# Patient Record
Sex: Female | Born: 1935 | Race: Black or African American | Hispanic: No | Marital: Single | Smoking: Never smoker
Health system: Southern US, Community
[De-identification: ages and names within clinical notes are randomized; demographics above are authoritative.]

## PROBLEM LIST (undated history)

## (undated) DIAGNOSIS — E785 Hyperlipidemia, unspecified: Secondary | ICD-10-CM

## (undated) DIAGNOSIS — I10 Essential (primary) hypertension: Secondary | ICD-10-CM

## (undated) DIAGNOSIS — M109 Gout, unspecified: Secondary | ICD-10-CM

## (undated) DIAGNOSIS — I4891 Unspecified atrial fibrillation: Secondary | ICD-10-CM

## (undated) DIAGNOSIS — I509 Heart failure, unspecified: Secondary | ICD-10-CM

## (undated) HISTORY — DX: Hyperlipidemia, unspecified: E78.5

## (undated) HISTORY — DX: Unspecified atrial fibrillation: I48.91

## (undated) HISTORY — DX: Gout, unspecified: M10.9

## (undated) HISTORY — PX: ABDOMINAL HYSTERECTOMY: SHX81

## (undated) HISTORY — DX: Heart failure, unspecified: I50.9

## (undated) HISTORY — DX: Essential (primary) hypertension: I10

---

## 2006-03-30 ENCOUNTER — Ambulatory Visit: Payer: Self-pay | Admitting: Internal Medicine

## 2006-03-31 ENCOUNTER — Ambulatory Visit: Payer: Self-pay | Admitting: Internal Medicine

## 2006-03-31 LAB — CONVERTED CEMR LAB
BUN: 21 mg/dL (ref 6–23)
Basophils Absolute: 0 10*3/uL (ref 0.0–0.1)
Basophils Relative: 0.5 % (ref 0.0–1.0)
CO2: 32 meq/L (ref 19–32)
Calcium: 9.5 mg/dL (ref 8.4–10.5)
Chloride: 106 meq/L (ref 96–112)
Creatinine, Ser: 1.2 mg/dL (ref 0.4–1.2)
Hemoglobin: 12.9 g/dL (ref 12.0–15.0)
MCHC: 34.2 g/dL (ref 30.0–36.0)
Monocytes Relative: 16.1 % — ABNORMAL HIGH (ref 3.0–11.0)
Platelets: 228 10*3/uL (ref 150–400)
Potassium: 4 meq/L (ref 3.5–5.1)
RBC: 3.99 M/uL (ref 3.87–5.11)
RDW: 14.2 % (ref 11.5–14.6)
TSH: 2.33 microintl units/mL (ref 0.35–5.50)

## 2006-04-03 ENCOUNTER — Ambulatory Visit: Payer: Self-pay | Admitting: Internal Medicine

## 2006-04-04 ENCOUNTER — Ambulatory Visit: Payer: Self-pay | Admitting: Internal Medicine

## 2006-04-06 ENCOUNTER — Ambulatory Visit: Payer: Self-pay | Admitting: Cardiovascular Disease

## 2006-04-10 ENCOUNTER — Ambulatory Visit: Payer: Self-pay | Admitting: Internal Medicine

## 2006-04-10 ENCOUNTER — Ambulatory Visit: Payer: Self-pay | Admitting: Cardiovascular Disease

## 2006-04-17 ENCOUNTER — Ambulatory Visit: Payer: Self-pay | Admitting: Cardiology

## 2006-04-24 ENCOUNTER — Ambulatory Visit: Payer: Self-pay | Admitting: Internal Medicine

## 2006-05-01 ENCOUNTER — Ambulatory Visit: Payer: Self-pay | Admitting: Internal Medicine

## 2006-05-01 LAB — CONVERTED CEMR LAB
AST: 26 units/L (ref 0–37)
Cholesterol: 163 mg/dL (ref 0–200)
HDL: 67.6 mg/dL (ref >39.0)
LDL Cholesterol: 85 mg/dL (ref 0–99)
Total CHOL/HDL Ratio: 2.4

## 2006-05-03 ENCOUNTER — Ambulatory Visit: Payer: Self-pay | Admitting: Cardiovascular Disease

## 2006-05-03 LAB — CONVERTED CEMR LAB
AST: 24 units/L (ref 0–37)
Albumin: 3.6 g/dL (ref 3.5–5.2)
Alkaline Phosphatase: 56 units/L (ref 39–117)
CO2: 30 meq/L (ref 19–32)
Chloride: 107 meq/L (ref 96–112)
Creatinine, Ser: 1.3 mg/dL — ABNORMAL HIGH (ref 0.4–1.2)
Potassium: 3.7 meq/L (ref 3.5–5.1)
Sodium: 143 meq/L (ref 135–145)
Total Bilirubin: 0.9 mg/dL (ref 0.3–1.2)
Total Protein: 7 g/dL (ref 6.0–8.3)

## 2006-05-15 ENCOUNTER — Ambulatory Visit: Payer: Self-pay | Admitting: Cardiovascular Disease

## 2006-05-29 ENCOUNTER — Ambulatory Visit: Payer: Self-pay | Admitting: Cardiology

## 2006-06-27 ENCOUNTER — Ambulatory Visit: Payer: Self-pay | Admitting: Cardiology

## 2006-06-29 DIAGNOSIS — I10 Essential (primary) hypertension: Secondary | ICD-10-CM | POA: Insufficient documentation

## 2006-06-29 DIAGNOSIS — E785 Hyperlipidemia, unspecified: Secondary | ICD-10-CM

## 2006-06-29 DIAGNOSIS — I509 Heart failure, unspecified: Secondary | ICD-10-CM | POA: Insufficient documentation

## 2006-06-30 ENCOUNTER — Ambulatory Visit: Payer: Self-pay | Admitting: Internal Medicine

## 2006-07-05 ENCOUNTER — Ambulatory Visit: Payer: Self-pay | Admitting: Cardiology

## 2006-07-13 ENCOUNTER — Telehealth (INDEPENDENT_AMBULATORY_CARE_PROVIDER_SITE_OTHER): Payer: Self-pay | Admitting: *Deleted

## 2006-07-13 LAB — CONVERTED CEMR LAB: RPR Titer: 1:2 {titer}

## 2006-07-26 ENCOUNTER — Ambulatory Visit: Payer: Self-pay | Admitting: Cardiovascular Disease

## 2006-08-07 ENCOUNTER — Ambulatory Visit: Payer: Self-pay | Admitting: Cardiovascular Disease

## 2006-08-07 ENCOUNTER — Ambulatory Visit: Payer: Self-pay | Admitting: Internal Medicine

## 2006-08-07 DIAGNOSIS — J309 Allergic rhinitis, unspecified: Secondary | ICD-10-CM | POA: Insufficient documentation

## 2006-08-07 DIAGNOSIS — F0391 Unspecified dementia with behavioral disturbance: Secondary | ICD-10-CM

## 2006-08-07 DIAGNOSIS — F341 Dysthymic disorder: Secondary | ICD-10-CM

## 2006-08-07 DIAGNOSIS — F0392 Unspecified dementia, unspecified severity, with psychotic disturbance: Secondary | ICD-10-CM | POA: Insufficient documentation

## 2006-08-21 ENCOUNTER — Ambulatory Visit: Payer: Self-pay | Admitting: Cardiovascular Disease

## 2006-08-24 ENCOUNTER — Encounter: Payer: Self-pay | Admitting: Cardiovascular Disease

## 2006-08-24 ENCOUNTER — Ambulatory Visit: Payer: Self-pay

## 2006-09-04 ENCOUNTER — Ambulatory Visit: Payer: Self-pay | Admitting: Cardiology

## 2006-09-20 ENCOUNTER — Ambulatory Visit: Payer: Self-pay | Admitting: Cardiology

## 2006-10-18 ENCOUNTER — Ambulatory Visit: Payer: Self-pay | Admitting: Internal Medicine

## 2006-10-27 ENCOUNTER — Ambulatory Visit: Payer: Self-pay | Admitting: Internal Medicine

## 2006-10-30 ENCOUNTER — Encounter: Payer: Self-pay | Admitting: Internal Medicine

## 2006-10-31 ENCOUNTER — Telehealth (INDEPENDENT_AMBULATORY_CARE_PROVIDER_SITE_OTHER): Payer: Self-pay | Admitting: *Deleted

## 2006-10-31 ENCOUNTER — Ambulatory Visit: Payer: Self-pay | Admitting: Internal Medicine

## 2006-10-31 DIAGNOSIS — R42 Dizziness and giddiness: Secondary | ICD-10-CM

## 2006-10-31 LAB — CONVERTED CEMR LAB
ALT: 75 units/L — ABNORMAL HIGH (ref 0–35)
AST: 77 units/L — ABNORMAL HIGH (ref 0–37)
BUN: 38 mg/dL — ABNORMAL HIGH (ref 6–23)
CO2: 30 meq/L (ref 19–32)
Chloride: 107 meq/L (ref 96–112)
Creatinine, Ser: 2.4 mg/dL — ABNORMAL HIGH (ref 0.4–1.2)
Hemoglobin: 11.4 g/dL

## 2006-11-08 ENCOUNTER — Ambulatory Visit: Payer: Self-pay

## 2006-11-08 ENCOUNTER — Encounter: Payer: Self-pay | Admitting: Internal Medicine

## 2006-11-08 ENCOUNTER — Ambulatory Visit: Payer: Self-pay | Admitting: Internal Medicine

## 2006-11-13 ENCOUNTER — Telehealth (INDEPENDENT_AMBULATORY_CARE_PROVIDER_SITE_OTHER): Payer: Self-pay | Admitting: *Deleted

## 2006-11-23 ENCOUNTER — Ambulatory Visit: Payer: Self-pay | Admitting: Cardiology

## 2006-12-06 ENCOUNTER — Encounter: Admission: RE | Admit: 2006-12-06 | Discharge: 2006-12-06 | Payer: Self-pay | Admitting: Internal Medicine

## 2006-12-06 ENCOUNTER — Ambulatory Visit: Payer: Self-pay | Admitting: Cardiology

## 2006-12-15 ENCOUNTER — Telehealth: Payer: Self-pay | Admitting: Internal Medicine

## 2006-12-27 ENCOUNTER — Telehealth (INDEPENDENT_AMBULATORY_CARE_PROVIDER_SITE_OTHER): Payer: Self-pay | Admitting: *Deleted

## 2007-01-03 ENCOUNTER — Ambulatory Visit: Payer: Self-pay | Admitting: Cardiology

## 2007-01-10 ENCOUNTER — Encounter: Admission: RE | Admit: 2007-01-10 | Discharge: 2007-01-10 | Payer: Self-pay | Admitting: Internal Medicine

## 2007-01-10 ENCOUNTER — Encounter (INDEPENDENT_AMBULATORY_CARE_PROVIDER_SITE_OTHER): Payer: Self-pay | Admitting: Interventional Radiology

## 2007-01-10 ENCOUNTER — Ambulatory Visit: Payer: Self-pay | Admitting: Cardiology

## 2007-01-10 ENCOUNTER — Other Ambulatory Visit: Admission: RE | Admit: 2007-01-10 | Discharge: 2007-01-10 | Payer: Self-pay | Admitting: Interventional Radiology

## 2007-01-10 ENCOUNTER — Encounter: Payer: Self-pay | Admitting: Internal Medicine

## 2007-01-15 ENCOUNTER — Ambulatory Visit: Payer: Self-pay | Admitting: Internal Medicine

## 2007-01-15 ENCOUNTER — Ambulatory Visit: Payer: Self-pay | Admitting: Cardiovascular Disease

## 2007-01-31 ENCOUNTER — Ambulatory Visit: Payer: Self-pay | Admitting: Cardiology

## 2007-02-21 ENCOUNTER — Telehealth (INDEPENDENT_AMBULATORY_CARE_PROVIDER_SITE_OTHER): Payer: Self-pay | Admitting: *Deleted

## 2007-02-22 ENCOUNTER — Ambulatory Visit: Payer: Self-pay | Admitting: Cardiology

## 2007-03-05 ENCOUNTER — Ambulatory Visit: Payer: Self-pay | Admitting: Cardiovascular Disease

## 2007-03-16 ENCOUNTER — Ambulatory Visit: Payer: Self-pay | Admitting: Internal Medicine

## 2007-03-16 DIAGNOSIS — R269 Unspecified abnormalities of gait and mobility: Secondary | ICD-10-CM | POA: Insufficient documentation

## 2007-03-19 ENCOUNTER — Ambulatory Visit: Payer: Self-pay | Admitting: Internal Medicine

## 2007-03-27 ENCOUNTER — Ambulatory Visit: Payer: Self-pay | Admitting: Internal Medicine

## 2007-03-28 ENCOUNTER — Telehealth: Payer: Self-pay | Admitting: Internal Medicine

## 2007-03-28 ENCOUNTER — Emergency Department (HOSPITAL_COMMUNITY): Admission: EM | Admit: 2007-03-28 | Discharge: 2007-03-28 | Payer: Self-pay | Admitting: Emergency Medicine

## 2007-03-30 ENCOUNTER — Telehealth (INDEPENDENT_AMBULATORY_CARE_PROVIDER_SITE_OTHER): Payer: Self-pay | Admitting: *Deleted

## 2007-03-30 LAB — CONVERTED CEMR LAB
ALT: 74 units/L — ABNORMAL HIGH (ref 0–35)
Albumin: 3.5 g/dL (ref 3.5–5.2)
Alkaline Phosphatase: 49 units/L (ref 39–117)
BUN: 15 mg/dL (ref 6–23)
Bilirubin, Direct: 0.1 mg/dL (ref 0.0–0.3)
Calcium: 9.5 mg/dL (ref 8.4–10.5)
Chloride: 104 meq/L (ref 96–112)
GFR calc Af Amer: 46 mL/min
GFR calc non Af Amer: 38 mL/min

## 2007-04-02 ENCOUNTER — Ambulatory Visit: Payer: Self-pay | Admitting: Cardiovascular Disease

## 2007-04-12 ENCOUNTER — Encounter: Payer: Self-pay | Admitting: Internal Medicine

## 2007-04-19 ENCOUNTER — Ambulatory Visit: Payer: Self-pay | Admitting: Internal Medicine

## 2007-04-19 DIAGNOSIS — I4891 Unspecified atrial fibrillation: Secondary | ICD-10-CM

## 2007-04-24 ENCOUNTER — Encounter: Payer: Self-pay | Admitting: Internal Medicine

## 2007-04-27 ENCOUNTER — Ambulatory Visit: Payer: Self-pay | Admitting: Internal Medicine

## 2007-04-27 ENCOUNTER — Ambulatory Visit: Payer: Self-pay | Admitting: Cardiovascular Disease

## 2007-04-27 ENCOUNTER — Encounter: Payer: Self-pay | Admitting: Internal Medicine

## 2007-05-07 ENCOUNTER — Ambulatory Visit: Payer: Self-pay | Admitting: Cardiovascular Disease

## 2007-05-18 ENCOUNTER — Telehealth: Payer: Self-pay | Admitting: Internal Medicine

## 2007-05-22 ENCOUNTER — Ambulatory Visit: Payer: Self-pay | Admitting: Internal Medicine

## 2007-05-28 ENCOUNTER — Ambulatory Visit: Payer: Self-pay | Admitting: Internal Medicine

## 2007-06-11 ENCOUNTER — Ambulatory Visit: Payer: Self-pay | Admitting: Cardiology

## 2007-06-25 ENCOUNTER — Ambulatory Visit: Payer: Self-pay | Admitting: Cardiology

## 2007-07-10 ENCOUNTER — Telehealth (INDEPENDENT_AMBULATORY_CARE_PROVIDER_SITE_OTHER): Payer: Self-pay | Admitting: *Deleted

## 2007-07-10 ENCOUNTER — Ambulatory Visit: Payer: Self-pay | Admitting: Cardiology

## 2007-07-16 ENCOUNTER — Ambulatory Visit: Payer: Self-pay | Admitting: Internal Medicine

## 2007-07-16 ENCOUNTER — Telehealth (INDEPENDENT_AMBULATORY_CARE_PROVIDER_SITE_OTHER): Payer: Self-pay | Admitting: *Deleted

## 2007-07-17 ENCOUNTER — Encounter: Payer: Self-pay | Admitting: Internal Medicine

## 2007-07-18 LAB — CONVERTED CEMR LAB
Albumin: 3.4 g/dL — ABNORMAL LOW (ref 3.5–5.2)
Alkaline Phosphatase: 59 units/L (ref 39–117)
Basophils Relative: 0.5 % (ref 0.0–1.0)
Eosinophils Absolute: 0 10*3/uL (ref 0.0–0.7)
Eosinophils Relative: 1 % (ref 0.0–5.0)
HCT: 34.9 % — ABNORMAL LOW (ref 36.0–46.0)
Hemoglobin: 12.1 g/dL (ref 12.0–15.0)
MCV: 93.1 fL (ref 78.0–100.0)
Neutro Abs: 2.5 10*3/uL (ref 1.4–7.7)
Neutrophils Relative %: 59.3 % (ref 43.0–77.0)
RBC: 3.78 M/uL — ABNORMAL LOW (ref 3.87–5.11)
Total Protein: 6.9 g/dL (ref 6.0–8.3)
WBC: 4.2 10*3/uL — ABNORMAL LOW (ref 4.5–10.5)

## 2007-07-20 ENCOUNTER — Telehealth (INDEPENDENT_AMBULATORY_CARE_PROVIDER_SITE_OTHER): Payer: Self-pay | Admitting: *Deleted

## 2007-07-23 ENCOUNTER — Ambulatory Visit: Payer: Self-pay | Admitting: Cardiology

## 2007-07-30 ENCOUNTER — Ambulatory Visit: Payer: Self-pay | Admitting: Cardiology

## 2007-07-31 ENCOUNTER — Telehealth (INDEPENDENT_AMBULATORY_CARE_PROVIDER_SITE_OTHER): Payer: Self-pay | Admitting: *Deleted

## 2007-08-13 ENCOUNTER — Ambulatory Visit: Payer: Self-pay | Admitting: Cardiology

## 2007-08-24 ENCOUNTER — Telehealth (INDEPENDENT_AMBULATORY_CARE_PROVIDER_SITE_OTHER): Payer: Self-pay | Admitting: *Deleted

## 2007-08-29 ENCOUNTER — Ambulatory Visit: Payer: Self-pay | Admitting: Cardiology

## 2007-09-18 ENCOUNTER — Emergency Department (HOSPITAL_COMMUNITY): Admission: EM | Admit: 2007-09-18 | Discharge: 2007-09-18 | Payer: Self-pay | Admitting: Emergency Medicine

## 2007-09-19 ENCOUNTER — Ambulatory Visit: Payer: Self-pay | Admitting: Cardiology

## 2007-09-21 ENCOUNTER — Emergency Department (HOSPITAL_COMMUNITY): Admission: EM | Admit: 2007-09-21 | Discharge: 2007-09-21 | Payer: Self-pay | Admitting: Emergency Medicine

## 2007-10-05 ENCOUNTER — Ambulatory Visit: Payer: Self-pay | Admitting: Internal Medicine

## 2007-10-08 ENCOUNTER — Ambulatory Visit: Payer: Self-pay | Admitting: Internal Medicine

## 2007-10-09 ENCOUNTER — Emergency Department (HOSPITAL_COMMUNITY): Admission: EM | Admit: 2007-10-09 | Discharge: 2007-10-09 | Payer: Self-pay | Admitting: Emergency Medicine

## 2007-10-25 ENCOUNTER — Ambulatory Visit: Payer: Self-pay | Admitting: Cardiovascular Disease

## 2007-10-25 ENCOUNTER — Ambulatory Visit: Payer: Self-pay | Admitting: Cardiology

## 2007-11-08 ENCOUNTER — Ambulatory Visit: Payer: Self-pay | Admitting: Cardiology

## 2007-12-05 ENCOUNTER — Ambulatory Visit: Payer: Self-pay | Admitting: Cardiology

## 2008-01-02 ENCOUNTER — Ambulatory Visit: Payer: Self-pay | Admitting: Cardiology

## 2008-01-17 ENCOUNTER — Ambulatory Visit: Payer: Self-pay | Admitting: Cardiology

## 2008-02-18 ENCOUNTER — Ambulatory Visit: Payer: Self-pay | Admitting: Internal Medicine

## 2008-03-03 ENCOUNTER — Ambulatory Visit: Payer: Self-pay | Admitting: Cardiology

## 2008-04-01 ENCOUNTER — Ambulatory Visit: Payer: Self-pay | Admitting: Internal Medicine

## 2008-04-01 ENCOUNTER — Encounter (INDEPENDENT_AMBULATORY_CARE_PROVIDER_SITE_OTHER): Payer: Self-pay | Admitting: *Deleted

## 2008-04-08 ENCOUNTER — Encounter: Payer: Self-pay | Admitting: Internal Medicine

## 2008-04-17 ENCOUNTER — Ambulatory Visit: Payer: Self-pay | Admitting: Cardiovascular Disease

## 2008-04-30 ENCOUNTER — Ambulatory Visit: Payer: Self-pay | Admitting: Cardiology

## 2008-05-21 ENCOUNTER — Ambulatory Visit: Payer: Self-pay | Admitting: Cardiology

## 2008-06-04 ENCOUNTER — Ambulatory Visit: Payer: Self-pay | Admitting: Internal Medicine

## 2008-06-14 ENCOUNTER — Encounter: Payer: Self-pay | Admitting: Internal Medicine

## 2008-06-18 ENCOUNTER — Ambulatory Visit: Payer: Self-pay | Admitting: Cardiovascular Disease

## 2008-06-25 ENCOUNTER — Ambulatory Visit: Payer: Self-pay | Admitting: Internal Medicine

## 2008-06-25 LAB — CONVERTED CEMR LAB: Vit D, 25-Hydroxy: 35 ng/mL (ref 30–89)

## 2008-07-01 LAB — CONVERTED CEMR LAB
ALT: 12 units/L (ref 0–35)
AST: 24 units/L (ref 0–37)
Basophils Absolute: 0 10*3/uL (ref 0.0–0.1)
CO2: 24 meq/L (ref 19–32)
Calcium: 9.4 mg/dL (ref 8.4–10.5)
Creatinine, Ser: 1.6 mg/dL — ABNORMAL HIGH (ref 0.4–1.2)
Eosinophils Absolute: 0.1 10*3/uL (ref 0.0–0.7)
Glucose, Bld: 94 mg/dL (ref 70–99)
Lymphocytes Relative: 25.1 % (ref 12.0–46.0)
MCHC: 33.8 g/dL (ref 30.0–36.0)
Neutrophils Relative %: 57.7 % (ref 43.0–77.0)
RDW: 13.6 % (ref 11.5–14.6)
TSH: 4.36 microintl units/mL (ref 0.35–5.50)

## 2008-07-02 ENCOUNTER — Ambulatory Visit: Payer: Self-pay | Admitting: Cardiology

## 2008-07-15 ENCOUNTER — Encounter: Payer: Self-pay | Admitting: *Deleted

## 2008-07-16 ENCOUNTER — Ambulatory Visit: Payer: Self-pay | Admitting: Cardiology

## 2008-07-18 ENCOUNTER — Encounter (INDEPENDENT_AMBULATORY_CARE_PROVIDER_SITE_OTHER): Payer: Self-pay | Admitting: *Deleted

## 2008-08-06 ENCOUNTER — Ambulatory Visit: Payer: Self-pay | Admitting: Internal Medicine

## 2008-08-20 ENCOUNTER — Encounter: Payer: Self-pay | Admitting: *Deleted

## 2008-08-22 ENCOUNTER — Telehealth: Payer: Self-pay | Admitting: Cardiovascular Disease

## 2008-08-25 ENCOUNTER — Ambulatory Visit: Payer: Self-pay | Admitting: Internal Medicine

## 2008-08-25 LAB — CONVERTED CEMR LAB
Bacteria, UA: NONE SEEN
Bilirubin Urine: NEGATIVE
Ketones, urine, test strip: NEGATIVE
Protein, U semiquant: NEGATIVE
RBC / HPF: NONE SEEN (ref <3)
Urobilinogen, UA: 0.2
pH: 5

## 2008-08-26 ENCOUNTER — Encounter: Payer: Self-pay | Admitting: Internal Medicine

## 2008-09-04 ENCOUNTER — Ambulatory Visit: Payer: Self-pay | Admitting: Cardiovascular Disease

## 2008-09-04 LAB — CONVERTED CEMR LAB: Prothrombin Time: 21.8 s

## 2008-09-09 ENCOUNTER — Ambulatory Visit: Payer: Self-pay | Admitting: Cardiovascular Disease

## 2008-09-25 ENCOUNTER — Ambulatory Visit: Payer: Self-pay | Admitting: Internal Medicine

## 2008-09-25 LAB — CONVERTED CEMR LAB: POC INR: 4

## 2008-10-09 ENCOUNTER — Ambulatory Visit: Payer: Self-pay | Admitting: Cardiovascular Disease

## 2008-10-30 ENCOUNTER — Ambulatory Visit: Payer: Self-pay | Admitting: Cardiology

## 2008-11-20 ENCOUNTER — Ambulatory Visit: Payer: Self-pay | Admitting: Internal Medicine

## 2008-11-30 IMAGING — CR DG FOOT COMPLETE 3+V*L*
3 series · 3 of 3 positions shown · non-contrast
Comparison: None

CLINICAL DATA: Fell 1 month ago.  Pain and swelling.

LEFT FOOT - COMPLETE 3+ VIEW

[t foot ap left]
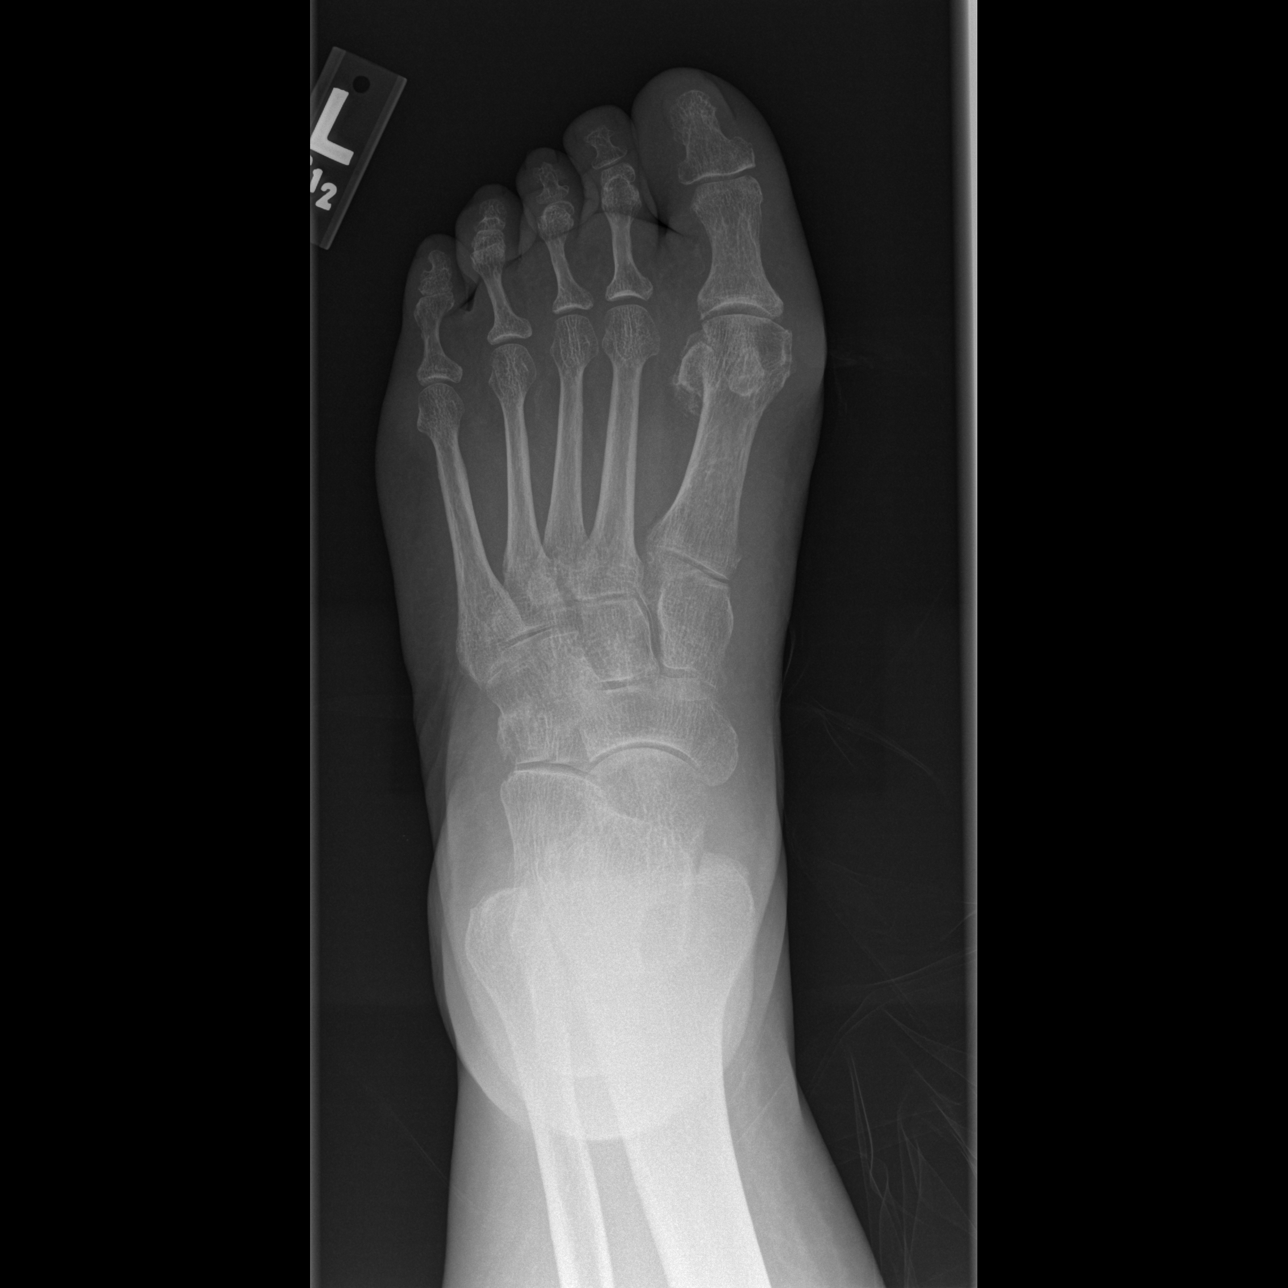

[t foot oblique left]
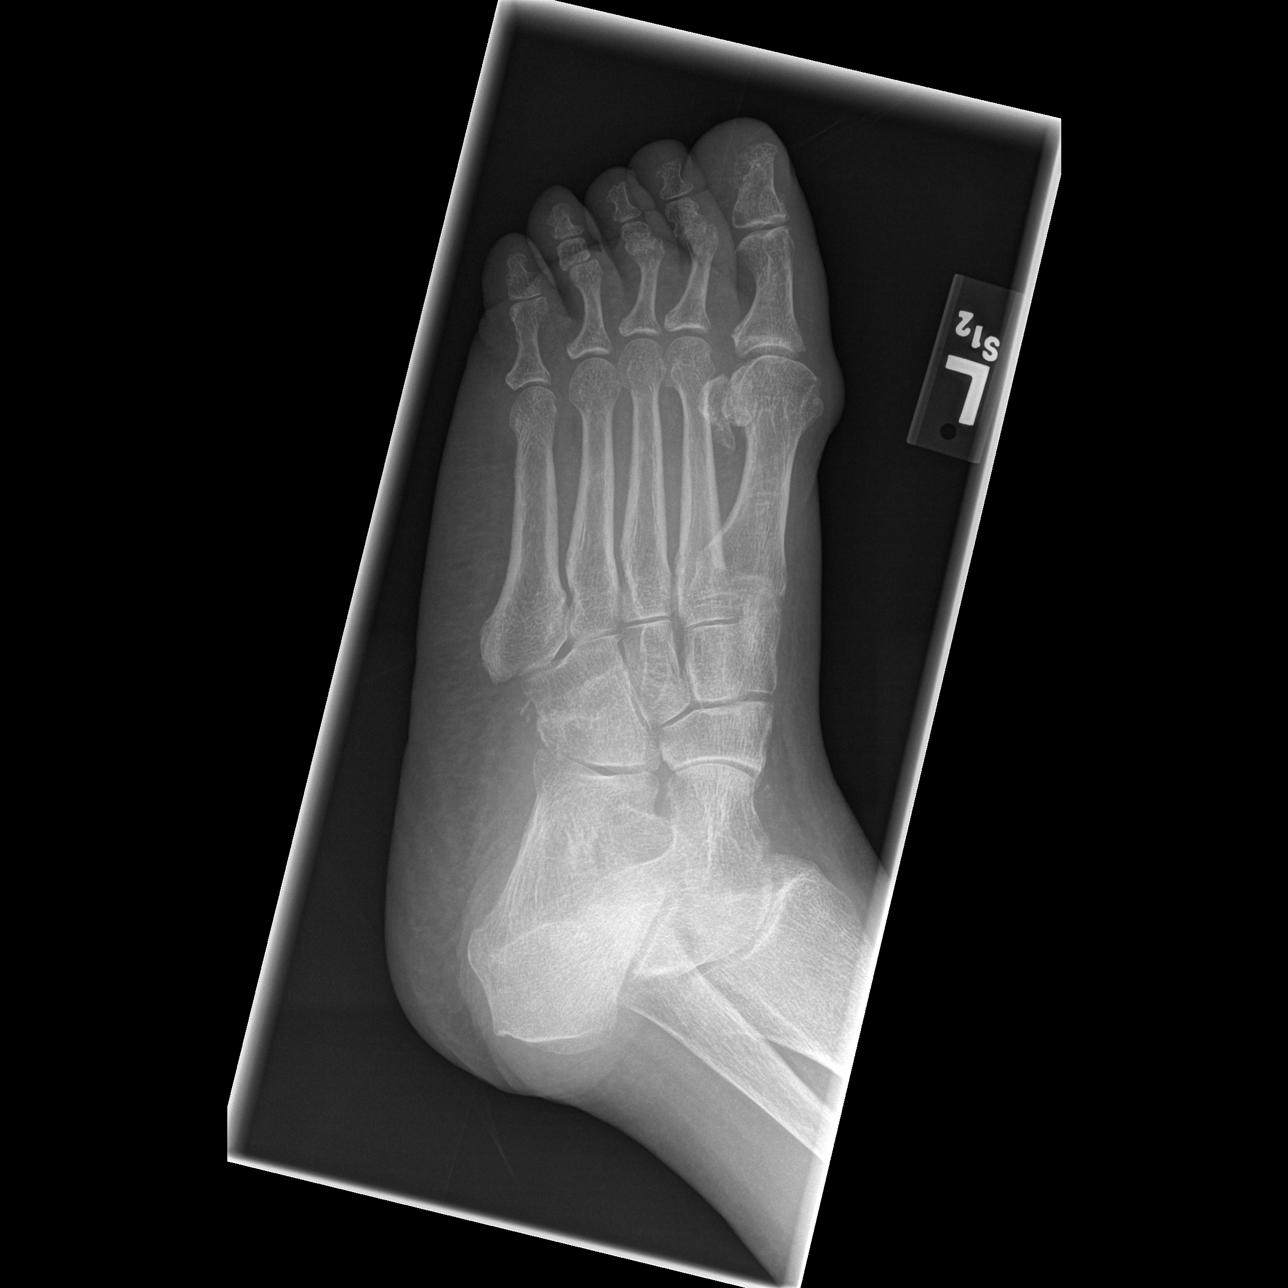

[t foot lat left]
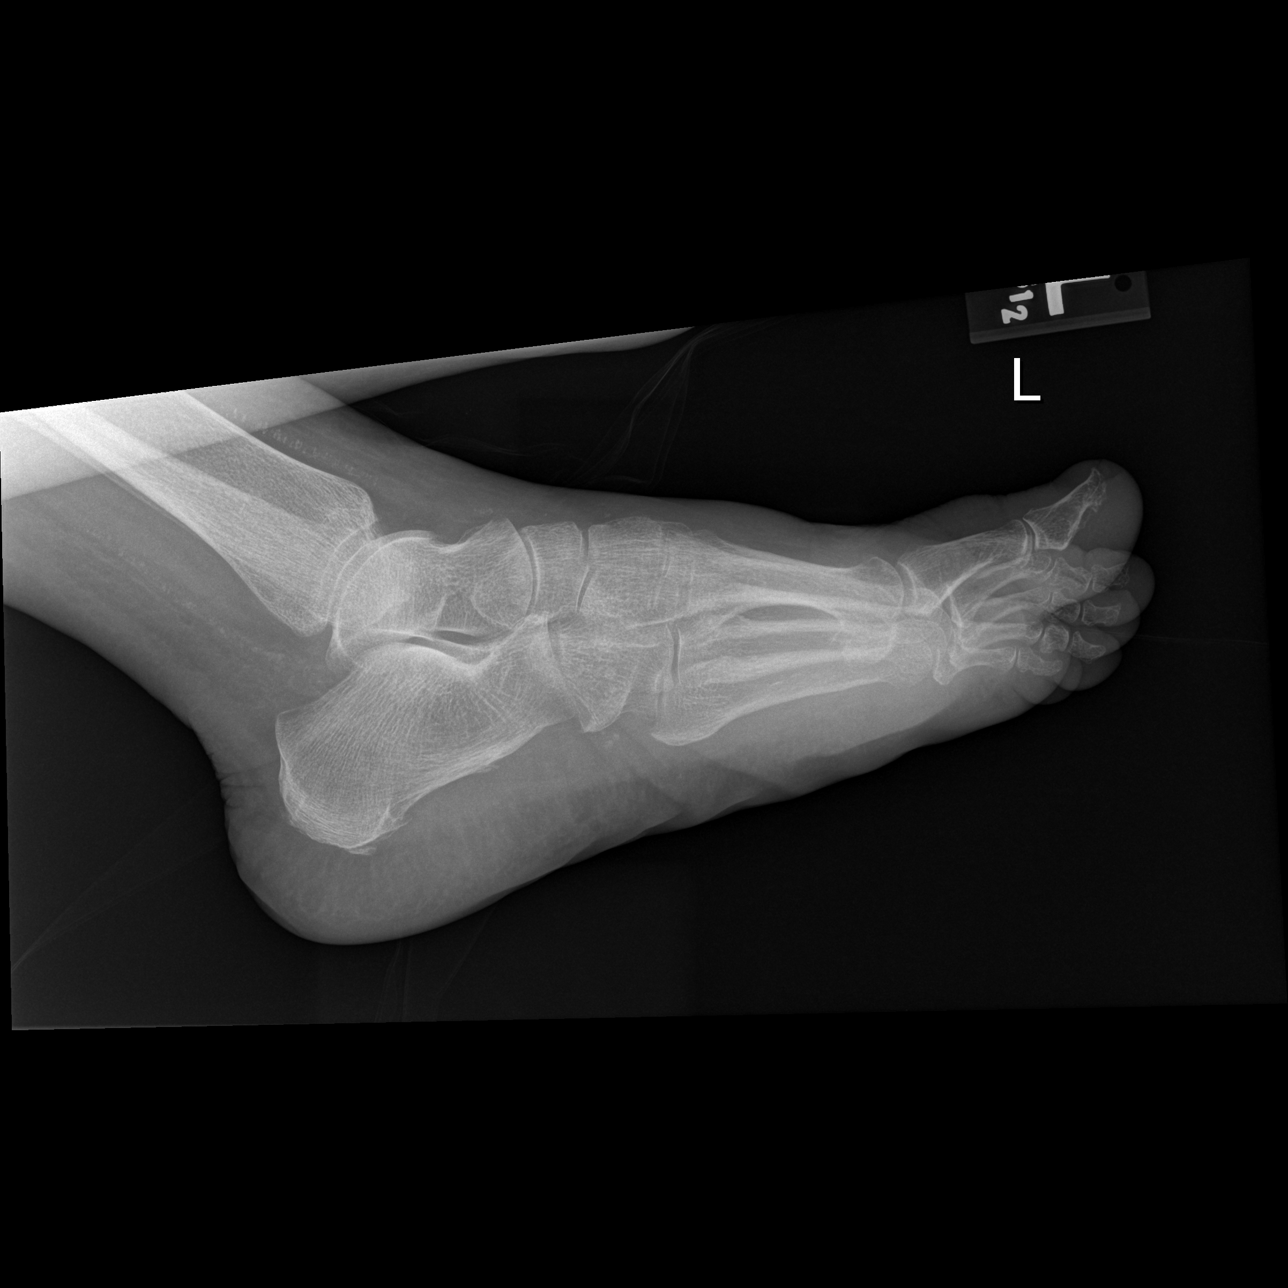

[3 of 3 positions shown; findings below may reference images not displayed]

FINDINGS: There is chronic degenerative disease at the metatarsal
phalangeal joint of the great toe.  I do not see a fracture.  There
is an abnormal appearance of the cuboid.  This could relate to a
subacute fracture or could be due to an old injury.  The other
bones appear unremarkable.
IMPRESSION: Degenerative change of the metatarsal phalangeal joint of the great
toe without evidence of acute injury in that location.

Abnormal appearance of the cuboid that could represent a subacute
fracture or a distant injury.

## 2008-12-18 ENCOUNTER — Ambulatory Visit: Payer: Self-pay | Admitting: Internal Medicine

## 2008-12-18 LAB — CONVERTED CEMR LAB: POC INR: 2.3

## 2008-12-26 ENCOUNTER — Encounter (INDEPENDENT_AMBULATORY_CARE_PROVIDER_SITE_OTHER): Payer: Self-pay | Admitting: *Deleted

## 2008-12-26 ENCOUNTER — Encounter: Payer: Self-pay | Admitting: Internal Medicine

## 2009-01-15 ENCOUNTER — Ambulatory Visit: Payer: Self-pay | Admitting: Cardiovascular Disease

## 2009-02-19 ENCOUNTER — Ambulatory Visit: Payer: Self-pay | Admitting: Cardiovascular Disease

## 2009-03-19 ENCOUNTER — Ambulatory Visit: Payer: Self-pay | Admitting: Cardiology

## 2009-03-19 LAB — CONVERTED CEMR LAB: POC INR: 2.2

## 2009-04-16 ENCOUNTER — Ambulatory Visit: Payer: Self-pay | Admitting: Cardiology

## 2009-04-16 LAB — CONVERTED CEMR LAB: POC INR: 1.8

## 2009-05-07 ENCOUNTER — Ambulatory Visit: Payer: Self-pay | Admitting: Cardiology

## 2009-05-08 ENCOUNTER — Telehealth (INDEPENDENT_AMBULATORY_CARE_PROVIDER_SITE_OTHER): Payer: Self-pay | Admitting: *Deleted

## 2009-05-21 ENCOUNTER — Ambulatory Visit: Payer: Self-pay | Admitting: Cardiovascular Disease

## 2009-05-29 ENCOUNTER — Encounter (INDEPENDENT_AMBULATORY_CARE_PROVIDER_SITE_OTHER): Payer: Self-pay | Admitting: *Deleted

## 2009-06-11 ENCOUNTER — Ambulatory Visit: Payer: Self-pay | Admitting: Cardiology

## 2009-06-11 LAB — CONVERTED CEMR LAB: POC INR: 1.8

## 2009-06-25 ENCOUNTER — Ambulatory Visit: Payer: Self-pay | Admitting: Internal Medicine

## 2009-07-02 ENCOUNTER — Encounter (INDEPENDENT_AMBULATORY_CARE_PROVIDER_SITE_OTHER): Payer: Self-pay | Admitting: *Deleted

## 2009-07-14 ENCOUNTER — Ambulatory Visit: Payer: Self-pay

## 2009-08-07 ENCOUNTER — Ambulatory Visit: Payer: Self-pay | Admitting: Cardiology

## 2009-09-04 ENCOUNTER — Ambulatory Visit: Payer: Self-pay | Admitting: Cardiology

## 2009-09-04 LAB — CONVERTED CEMR LAB: POC INR: 1.9

## 2009-09-15 ENCOUNTER — Ambulatory Visit: Payer: Self-pay | Admitting: Internal Medicine

## 2009-09-25 ENCOUNTER — Ambulatory Visit: Payer: Self-pay | Admitting: Internal Medicine

## 2009-10-23 ENCOUNTER — Ambulatory Visit: Payer: Self-pay | Admitting: Internal Medicine

## 2009-10-23 LAB — CONVERTED CEMR LAB: POC INR: 2.6

## 2009-11-20 ENCOUNTER — Ambulatory Visit: Payer: Self-pay | Admitting: Cardiology

## 2009-11-20 LAB — CONVERTED CEMR LAB: POC INR: 1.4

## 2009-12-04 ENCOUNTER — Ambulatory Visit: Payer: Self-pay | Admitting: Internal Medicine

## 2009-12-04 LAB — CONVERTED CEMR LAB: POC INR: 2.9

## 2010-01-01 ENCOUNTER — Ambulatory Visit: Payer: Self-pay | Admitting: Cardiology

## 2010-01-22 ENCOUNTER — Ambulatory Visit: Payer: Self-pay | Admitting: Internal Medicine

## 2010-02-12 ENCOUNTER — Ambulatory Visit: Payer: Self-pay | Admitting: Cardiology

## 2010-02-12 LAB — CONVERTED CEMR LAB: POC INR: 2.6

## 2010-02-23 ENCOUNTER — Telehealth: Payer: Self-pay | Admitting: Internal Medicine

## 2010-03-09 ENCOUNTER — Ambulatory Visit: Admit: 2010-03-09 | Payer: Self-pay | Admitting: Internal Medicine

## 2010-03-12 ENCOUNTER — Ambulatory Visit: Admission: RE | Admit: 2010-03-12 | Discharge: 2010-03-12 | Payer: Self-pay | Source: Home / Self Care

## 2010-03-16 NOTE — Assessment & Plan Note (Signed)
Summary: Sara Carlson /KN   Vital Signs:  Patient profile:   77 year old female Height:      61.25 inches Weight:      163.13 pounds BMI:     30.68 Pulse rate:   93 / minute BP sitting:   136 / 76  (left arm) Cuff size:   large  Vitals Entered By: Army Fossa CMA (September 15, 2009 2:30 PM) CC: ROV: not fasting Comments Not had recent colonocopy or mammo   History of Present Illness:  ROV Congestive heart failure-- good medication compliance ,  saw cards several months ago, stable   Hypertension--  checks ambulatory BPs sometimes , usually "good" dementia-- slightly  worse per daughter ; can't remember where things are ; no "wondering" outside her place  WEIGHT LOSS  -- minimal wt loss if any, appetite "ok"    Preventive Screening-Counseling & Management  Alcohol-Tobacco     Alcohol type: occasional wine  Caffeine-Diet-Exercise     Does Patient Exercise: yes  Current Medications (verified): 1)  Spironolactone 25 Mg Tabs (Spironolactone) .... Take 1 Tablet By Mouth Once A Day 2)  Diovan 160 Mg  Tabs (Valsartan) .Marland Kitchen.. 1 By Mouth Once Daily - Needs Visit For Additional Refills 3)  Furosemide 40 Mg Tabs (Furosemide) .... Take 1 By Mouth Two Times A Day 4)  Cardura 8 Mg  Tabs (Doxazosin Mesylate) .Marland Kitchen.. 1 By Mouth Once Daily 5)  Felodipine 2.5 Mg  Tb24 (Felodipine) .Marland Kitchen.. 1 By Mouth Once Daily 6)  Systane Ultra 0.4-0.3 % Soln (Polyethyl Glycol-Propyl Glycol) .... As Diretced 7)  Aricept 10 Mg Tabs (Donepezil Hcl) .Marland Kitchen.. 1 By Mouth Once Daily 8)  Coumadin 2.5 Mg Tabs (Warfarin Sodium) .... As Diretced 9)  Claritin-D 12 Hour 5-120 Mg Xr12h-Tab (Loratadine-Pseudoephedrine)  Allergies (verified): No Known Drug Allergies  Past History:  Past Medical History: Congestive heart failure Hyperlipidemia Hypertension anxiety-depression dementia WEIGHT LOSS   ATRIAL FIBRILLATION, PAROXYSMAL ALLERGIC RHINITIS (ICD-477.9)  Past Surgical History: Reviewed history from 04/01/2008 and no  changes required. Hysterectomy, no oophorectomy   Family History: colon ca--no breast ca--no DM--no CAD--no  Social History: Moved from [place] to [place] Feb. 2008 lives in her appartment "independent living" ADLs: independent doesn't drive Daughter [name] is a friend of Dr. Eden Emms and works at [employer] day care. no tobacco ETOH--  wine sometimes Does Patient Exercise:  yes  Review of Systems CV:  Denies chest pain or discomfort and swelling of feet. Resp:  Denies cough and shortness of breath. GI:  Denies bloody stools, diarrhea, nausea, and vomiting.  Physical Exam  General:  alert and well-developed.   Lungs:  normal respiratory effort, no intercostal retractions, no accessory muscle use, and normal breath sounds.   Heart:  irregular, ventricular rate about 90/minute Abdomen:  soft, non-tender, and no distention.   Extremities:  no edema Neurologic:  alert, pleasant, cooperative Oriented to self, recognize her daughter. Oriented to place Not  oriented in time, does not know her address or phone number Psych:  not anxious appearing and not depressed appearing.  actually seems in good spirits   Impression & Recommendations:  Problem # 1:  ATRIAL FIBRILLATION, PAROXYSMAL (ICD-427.31) well controlled, closely follow up by cardiology Her updated medication list for this problem includes:    Felodipine 2.5 Mg Tb24 (Felodipine) .Marland Kitchen... 1 by mouth once daily    Coumadin 2.5 Mg Tabs (Warfarin sodium) .Marland Kitchen... As diretced  Orders: TLB-TSH (Thyroid Stimulating Hormone) (84443-TSH)  Problem # 2:  DEMENTIA, SENILE,  UNCOMPLICATED (ICD-290.0) currently on  Aricept Slightly worse per daughter, discussed Candiss Norse as an option patient and daughter willing to try printed material provided regards s/e , encouraged to read printout  no interactions found w/  coumadin  explained that this is in addition to aricept and will slow down memory loss   re asses in 2 months   Problem # 3:   HYPERTENSION (ICD-401.9) at goal , labs Her updated medication list for this problem includes:    Spironolactone 25 Mg Tabs (Spironolactone) .Marland Kitchen... Take 1 tablet by mouth once a day    Diovan 160 Mg Tabs (Valsartan) .Marland Kitchen... 1 by mouth once daily - needs visit for additional refills    Furosemide 40 Mg Tabs (Furosemide) .Marland Kitchen... Take 1 by mouth two times a day    Cardura 8 Mg Tabs (Doxazosin mesylate) .Marland Kitchen... 1 by mouth once daily    Felodipine 2.5 Mg Tb24 (Felodipine) .Marland Kitchen... 1 by mouth once daily  BP today: 136/76 Prior BP: 160/70 (09/09/2008)  Labs Reviewed: K+: 4.4 (06/25/2008) Creat: : 1.6 (06/25/2008)   Chol: 163 (05/01/2006)   HDL: 67.6 (05/01/2006)   LDL: 85 (05/01/2006)   TG: 54 (05/01/2006)  Orders: Venipuncture (46962) TLB-BMP (Basic Metabolic Panel-BMET) (80048-METABOL) TLB-CBC Platelet - w/Differential (85025-CBCD)  Problem # 4:  HYPERLIPIDEMIA (ICD-272.4) we have not  been able to check her FLP in a while we'll check labs today despite the fact that she had some cereal morning Labs Reviewed: SGOT: 24 (06/25/2008)   SGPT: 12 (06/25/2008)   HDL:67.6 (05/01/2006)  LDL:85 (05/01/2006)  Chol:163 (05/01/2006)  Trig:54 (05/01/2006)  Orders: TLB-Lipid Panel (80061-LIPID) TLB-ALT (SGPT) (84460-ALT) TLB-AST (SGOT) (84450-SGOT)  Problem # 5:  CONGESTIVE HEART FAILURE (ICD-428.0) seems well controlled Her updated medication list for this problem includes:    Spironolactone 25 Mg Tabs (Spironolactone) .Marland Kitchen... Take 1 tablet by mouth once a day    Diovan 160 Mg Tabs (Valsartan) .Marland Kitchen... 1 by mouth once daily - needs visit for additional refills    Furosemide 40 Mg Tabs (Furosemide) .Marland Kitchen... Take 1 by mouth two times a day    Coumadin 2.5 Mg Tabs (Warfarin sodium) .Marland Kitchen... As diretced  Problem # 6:  HEALTH SCREENING (ICD-V70.0) Will do a Medicare physical and return to the office  Problem # 7:  ALLERGIC RHINITIS (ICD-477.9)  taking Claritin-D Recommend to take plain Claritin  Her updated  medication list for this problem includes:    Claritin Reditabs 5 Mg Tbdp (Loratadine) ..... One a day as needed for allergies  Complete Medication List: 1)  Spironolactone 25 Mg Tabs (Spironolactone) .... Take 1 tablet by mouth once a day 2)  Diovan 160 Mg Tabs (Valsartan) .Marland Kitchen.. 1 by mouth once daily - needs visit for additional refills 3)  Furosemide 40 Mg Tabs (Furosemide) .... Take 1 by mouth two times a day 4)  Cardura 8 Mg Tabs (Doxazosin mesylate) .Marland Kitchen.. 1 by mouth once daily 5)  Felodipine 2.5 Mg Tb24 (Felodipine) .Marland Kitchen.. 1 by mouth once daily 6)  Systane Ultra 0.4-0.3 % Soln (Polyethyl glycol-propyl glycol) .... As diretced 7)  Aricept 10 Mg Tabs (Donepezil hcl) .Marland Kitchen.. 1 by mouth once daily 8)  Coumadin 2.5 Mg Tabs (Warfarin sodium) .... As diretced 9)  Claritin Reditabs 5 Mg Tbdp (Loratadine) .... One a day as needed for allergies 10)  Namenda 5 Mg Tabs (Memantine hcl) .Marland Kitchen.. 1 by mouth once daily x 10 days, then 1 by mouth two times a day  Patient Instructions: 1)  come back in 2  months fasting for your physical exam 2)  stop Claritin-D, take plain Claritin 5 mg daily if needed Prescriptions: NAMENDA 5 MG TABS (MEMANTINE HCL) 1 by mouth once daily x 10 days, then 1 by mouth two times a day  #60 x 6   Entered and Authorized by:   Elita Quick E. Paz MD   Signed by:   Nolon Rod. Paz MD on 09/15/2009   Method used:   Print then Give to Patient   RxID:   [id number]    Risk Factors:  Alcohol use:  yes    Type:  occasional wine Exercise:  yes

## 2010-03-16 NOTE — Medication Information (Signed)
Summary: rov/tm  Anticoagulant Therapy  Managed by: Eda Keys, PharmD Referring MD: Charlton Haws MD Supervising MD: [name] MD, Sharlot Gowda Indication 1: Atrial Fibrillation (ICD-427.31) Lab Used: [facility] Site: [facility] INR POC 1.9 INR RANGE 2.0-3.0  Dietary changes: no    Health status changes: no    Bleeding/hemorrhagic complications: no    Recent/future hospitalizations: no    Any changes in medication regimen? no    Recent/future dental: no  Any missed doses?: no       Is patient compliant with meds? yes       Allergies: No Known Drug Allergies  Anticoagulation Management History:      The patient is taking warfarin and comes in today for a routine follow up visit.  Positive risk factors for bleeding include an age of 75 years or older and presence of serious comorbidities.  The bleeding index is 'intermediate risk'.  Positive CHADS2 values include History of CHF, History of HTN, and Age > 75 years old.  The start date was 04/10/2006.  Her last INR was 4.5 RATIO.  Anticoagulation responsible provider: [name] MD, Sharlot Gowda.  INR POC: 1.9.  Cuvette Lot#: [id number].  Exp: 09/2010.    Anticoagulation Management Assessment/Plan:      The patient's current anticoagulation dose is Coumadin 2.5 mg tabs: as diretced.  The target INR is 2 - 3.  The next INR is due 08/05/2009.  Anticoagulation instructions were given to patient/daughter.  Results were reviewed/authorized by Eda Keys, PharmD.  She was notified by Eda Keys.         Prior Anticoagulation Instructions: INR 4.3 Skip 1 dose of coumadin.  Then change dose to 2 pills everyday except 1 pill on Tuesdays, Thursdays and Saturdays. Recheck in 2-3 weeks.   Current Anticoagulation Instructions: INR 1.9  Take 2 tablets today.  Then return to normal dosing schedule of 1 tablet on Tuesday, Thursday, and Saturday, and 2 tablets all other days.  Return to clinic in 3 weeks.

## 2010-03-16 NOTE — Medication Information (Signed)
Summary: rov/sel  Anticoagulant Therapy  Managed by: Cloyde Reams, RN, BSN Referring MD: Charlton Haws MD Supervising MD: Juanda Chance MD, Bruce Indication 1: Atrial Fibrillation (ICD-427.31) Lab Used: [facility] Site: [facility] INR POC 2.9 INR RANGE 2.0-3.0  Dietary changes: no    Health status changes: no    Bleeding/hemorrhagic complications: no    Recent/future hospitalizations: no    Any changes in medication regimen? no    Recent/future dental: no  Any missed doses?: no       Is patient compliant with meds? yes       Allergies: No Known Drug Allergies  Anticoagulation Management History:      The patient is taking warfarin and comes in today for a routine follow up visit.  Positive risk factors for bleeding include an age of 75 years or older and presence of serious comorbidities.  The bleeding index is 'intermediate risk'.  Positive CHADS2 values include History of CHF, History of HTN, and Age > 3 years old.  The start date was 04/10/2006.  Her last INR was 4.5 RATIO.  Anticoagulation responsible provider: Juanda Chance MD, Smitty Cords.  INR POC: 2.9.  Cuvette Lot#: [id number].  Exp: 01/2011.    Anticoagulation Management Assessment/Plan:      The patient's current anticoagulation dose is Coumadin 2.5 mg tabs: as diretced.  The target INR is 2 - 3.  The next INR is due 01/01/2010.  Anticoagulation instructions were given to patient/daughter.  Results were reviewed/authorized by Cloyde Reams, RN, BSN.  She was notified by Cloyde Reams RN.         Prior Anticoagulation Instructions: INR 1.4  Take an extra tablet today and 2 tablets tomorrow. Then continue current dose of 2 tablets everyday except 1 tablet on Tuesday, Thursday, and Saturday. Recheck in 2 weeks.    Current Anticoagulation Instructions: INR 2.9  Continue on same dosage 2 tablets daily except 1 tablet on Tuesdays, Thursdays, and Saturdays.  Recheck in 4 weeks.

## 2010-03-16 NOTE — Medication Information (Signed)
Summary: rov/ewj  Anticoagulant Therapy  Managed by: Bethena Midget, RN, BSN Referring MD: Charlton Haws MD Supervising MD: Eden Emms MD, Theron Arista Indication 1: Atrial Fibrillation (ICD-427.31) Lab Used: [facility] Site: [facility] INR POC 2.0 INR RANGE 2.0-3.0  Dietary changes: no    Health status changes: no    Bleeding/hemorrhagic complications: no    Recent/future hospitalizations: no    Any changes in medication regimen? no    Recent/future dental: no  Any missed doses?: no       Is patient compliant with meds? yes       Allergies: No Known Drug Allergies  Anticoagulation Management History:      The patient is taking warfarin and comes in today for a routine follow up visit.  Positive risk factors for bleeding include an age of 75 years or older and presence of serious comorbidities.  The bleeding index is 'intermediate risk'.  Positive CHADS2 values include History of CHF, History of HTN, and Age > 67 years old.  The start date was 04/10/2006.  Her last INR was 4.5 RATIO.  Anticoagulation responsible provider: Eden Emms MD, Theron Arista.  INR POC: 2.0.  Cuvette Lot#: [id number].  Exp: 06/2010.    Anticoagulation Management Assessment/Plan:      The patient's current anticoagulation dose is Coumadin 2.5 mg tabs: as diretced.  The target INR is 2 - 3.  The next INR is due 06/11/2009.  Anticoagulation instructions were given to patient/daughter.  Results were reviewed/authorized by Bethena Midget, RN, BSN.  She was notified by Bethena Midget, RN, BSN.         Prior Anticoagulation Instructions: INR 1.5  Take an extra 1 tablet today then start taking 2 tablets daily except 1 tablet on Sundays, Tuesdays, and Thursdays.  Recheck in 2 weeks.    Current Anticoagulation Instructions: INR 2.0 Continue 2 pills everyday except  1pill on Tuesdays, Thursdays and Sundays.

## 2010-03-16 NOTE — Medication Information (Signed)
Summary: rov/eac  Anticoagulant Therapy  Managed by: Bethena Midget, RN, BSN Referring MD: Charlton Haws MD Supervising MD: Johney Frame MD, Fayrene Fearing Indication 1: Atrial Fibrillation (ICD-427.31) Lab Used: [facility] Site: [facility] INR POC 4.3 INR RANGE 2.0-3.0  Dietary changes: no    Health status changes: no    Bleeding/hemorrhagic complications: no    Recent/future hospitalizations: no    Any changes in medication regimen? no    Recent/future dental: no  Any missed doses?: no       Is patient compliant with meds? yes       Allergies: No Known Drug Allergies  Anticoagulation Management History:      The patient is taking warfarin and comes in today for a routine follow up visit.  Positive risk factors for bleeding include an age of 75 years or older and presence of serious comorbidities.  The bleeding index is 'intermediate risk'.  Positive CHADS2 values include History of CHF, History of HTN, and Age > 75 years old.  The start date was 04/10/2006.  Her last INR was 4.5 RATIO.  Anticoagulation responsible provider: Mikaili Flippin MD, Fayrene Fearing.  INR POC: 4.3.  Cuvette Lot#: [id number].  Exp: 09/2010.    Anticoagulation Management Assessment/Plan:      The patient's current anticoagulation dose is Coumadin 2.5 mg tabs: as diretced.  The target INR is 2 - 3.  The next INR is due 07/14/2009.  Anticoagulation instructions were given to patient/daughter.  Results were reviewed/authorized by Bethena Midget, RN, BSN.  She was notified by Bethena Midget, RN, BSN.         Prior Anticoagulation Instructions: INR 1.8 Today take extra 2.5mg s then change dose to 5mg s everyday except 2.5mg s on Tuesdays and Thursdays. Recheck in 2 weeks.   Current Anticoagulation Instructions: INR 4.3 Skip 1 dose of coumadin.  Then change dose to 2 pills everyday except 1 pill on Tuesdays, Thursdays and Saturdays. Recheck in 2-3 weeks.

## 2010-03-16 NOTE — Letter (Signed)
Summary: Appointment - Reminder 2  [facility], Main Office  [address]   [place], [place] [address]   Phone: [phone]  Fax: [phone]     Jul 02, 2009 MRN: [id number]   Sara Carlson [address] [place], [place]  [address]   Dear Ms. Haley,  Our records indicate that it is time to schedule a follow-up appointment with Dr. Eden Emms. It is very important that we reach you to schedule this appointment. We look forward to participating in your health care needs. Please contact us at the number listed above at your earliest convenience to schedule your appointment.  If you are unable to make an appointment at this time, give [place] a call so we can update our records.     Sincerely,   Migdalia Dk [facility] Scheduling Team

## 2010-03-16 NOTE — Medication Information (Signed)
Summary: rov/tm  Anticoagulant Therapy  Managed by: Cloyde Reams, RN, BSN Referring MD: Charlton Haws MD Supervising MD: Daleen Squibb MD, Maisie Fus Indication 1: Atrial Fibrillation (ICD-427.31) Lab Used: [facility] Site: [facility] INR POC 1.5 INR RANGE 2.0-3.0  Dietary changes: no    Health status changes: no    Bleeding/hemorrhagic complications: no    Recent/future hospitalizations: no    Any changes in medication regimen? no    Recent/future dental: no  Any missed doses?: no       Is patient compliant with meds? yes       Allergies (verified): No Known Drug Allergies  Anticoagulation Management History:      The patient is taking warfarin and comes in today for a routine follow up visit.  Positive risk factors for bleeding include an age of 75 years or older and presence of serious comorbidities.  The bleeding index is 'intermediate risk'.  Positive CHADS2 values include History of CHF, History of HTN, and Age > 75 years old.  The start date was 04/10/2006.  Her last INR was 4.5 RATIO.  Anticoagulation responsible provider: Daleen Squibb MD, Maisie Fus.  INR POC: 1.5.  Cuvette Lot#: [id number].  Exp: 06/2010.    Anticoagulation Management Assessment/Plan:      The patient's current anticoagulation dose is Coumadin 2.5 mg tabs: as diretced.  The target INR is 2 - 3.  The next INR is due 05/21/2009.  Anticoagulation instructions were given to patient/daughter.  Results were reviewed/authorized by Cloyde Reams, RN, BSN.  She was notified by Cloyde Reams RN.         Prior Anticoagulation Instructions: INR 1.8 Today take extra 1 tablet, then resume 1 tablet everyday except 2 tablets on Mondays, Wednesdays and Fridays. Recheck in 3 weeks.   Current Anticoagulation Instructions: INR 1.5  Take an extra 1 tablet today then start taking 2 tablets daily except 1 tablet on Sundays, Tuesdays, and Thursdays.  Recheck in 2 weeks.

## 2010-03-16 NOTE — Assessment & Plan Note (Signed)
Summary: PER CHECK OUT/SF   History of Present Illness: Sara Carlson is seen today for f/U of afib, anticoagulation, MR and pulmonary hypertension.  Despite her age she has been a reasonable coumadin candidate still living indep. and with no falls.  Her INR's have bee Rx.  She has had mild to moderate MR in the past but elevated PA pressures by echo disproportionately high at mid 80s.  She has had lower extremity edema likely from pulmonary hypertension and has responded well to Lasix and aldactone.  She is at assisted living [name] and [name] takes good care of her.  She loves her Snickers candy but has to limit it because of gout.  Despite the estimated PA pressure she has not acted like bad PHT with no significant SOB and given her age we have not been agressive about adding vasodilators  Preventive Screening-Counseling & Management  Alcohol-Tobacco     Smoking Status: never  Current Problems (verified): 1)  Uti  (ICD-599.0) 2)  Frequency, Urinary  (ICD-788.41) 3)  Health Screening  (ICD-V70.0) 4)  Atrial Fibrillation, Paroxysmal  (ICD-427.31) 5)  Abnormality of Gait  (ICD-781.2) 6)  Dizziness  (ICD-780.4) 7)  Dementia, Senile, Uncomplicated  (ICD-290.0) 8)  Allergic Rhinitis  (ICD-477.9) 9)  Anxiety Depression  (ICD-300.4) 10)  Hypertension  (ICD-401.9) 11)  Hyperlipidemia  (ICD-272.4) 12)  Congestive Heart Failure  (ICD-428.0)  Current Medications (verified): 1)  Spironolactone 25 Mg Tabs (Spironolactone) .... Take 1 Tablet By Mouth Once A Day 2)  Diovan 160 Mg  Tabs (Valsartan) .Marland Kitchen.. 1 By Mouth Qd 3)  Furosemide 40 Mg Tabs (Furosemide) .... Take 1 By Mouth Two Times A Day 4)  Cardura 8 Mg  Tabs (Doxazosin Mesylate) .Marland Kitchen.. 1 By Mouth Once Daily 5)  Felodipine 2.5 Mg  Tb24 (Felodipine) .Marland Kitchen.. 1 By Mouth Once Daily 6)  Systane Ultra 0.4-0.3 % Soln (Polyethyl Glycol-Propyl Glycol) .... As Diretced 7)  Aricept 10 Mg Tabs (Donepezil Hcl) .Marland Kitchen.. 1 By Mouth Once Daily 8)   Coumadin 2.5 Mg Tabs (Warfarin Sodium) .... As Diretced  Allergies (verified): No Known Drug Allergies  Past History:  Past Medical History: Last updated: 04/01/2008 Congestive heart failure Hyperlipidemia Hypertension anxiety-depression dementia WEIGHT LOSS   ATRIAL FIBRILLATION, PAROXYSMAL ALLERGIC RHINITIS (ICD-477.9)  Past Surgical History: Last updated: 04/01/2008 Hysterectomy, no oophorectomy   Family History: Last updated[place] 05/22/2007 [place]  Social History: Last updated: 04/01/2008 Moved from [place] to [place] Feb. 2008 lives in her appartment "independent living" ADLs: independent doesn't drive Daughter [name] is a friend of Dr. Eden Emms and works at [employer] day care.  Review of Systems       Denies fever, malais, weight loss, blurry vision, decreased visual acuity, cough, sputum, SOB, hemoptysis, pleuritic pain, palpitaitons, heartburn, abdominal pain, melena, lower extremity edema, claudication, or rash. All other systems reviewed and negative  Vital Signs:  Patient profile:   77 year old female Weight:      168 pounds Resp:     12 per minute  Vitals Entered By: Kem Parkinson (February 19, 2009 2:33 PM)  Physical Exam  General:  Affect appropriate Healthy:  appears stated age HEENT: normal Neck supple with no adenopathy JVP elevated  no bruits no thyromegaly Lungs clear with no wheezing and good diaphragmatic motion Heart:  S1/S2 no murmur,rub, gallop or click PMI normal Abdomen: benighn, BS positve, no tenderness, no AAA no bruit.  No HSM or HJR Distal pulses intact with no bruits Trace  edema Neuro non-focal  Skin warm and dry    Impression & Recommendations:  Problem # 1:  ATRIAL FIBRILLATION, PAROXYSMAL (ICD-427.31) Good rate control with no syncope or palpitations Her updated medication list for this problem includes:    Coumadin 2.5 Mg Tabs (Warfarin sodium) .Marland Kitchen... As diretced  Problem # 2:  HYPERTENSION (ICD-401.9) Well  contorlled Her updated medication list for this problem includes:    Spironolactone 25 Mg Tabs (Spironolactone) .Marland Kitchen... Take 1 tablet by mouth once a day    Diovan 160 Mg Tabs (Valsartan) .Marland Kitchen... 1 by mouth qd    Furosemide 40 Mg Tabs (Furosemide) .Marland Kitchen... Take 1 by mouth two times a day    Cardura 8 Mg Tabs (Doxazosin mesylate) .Marland Kitchen... 1 by mouth once daily    Felodipine 2.5 Mg Tb24 (Felodipine) .Marland Kitchen... 1 by mouth once daily  Problem # 3:  HYPERLIPIDEMIA (ICD-272.4) Continue diet Rx as LDL under 100 CHOL: 163 (05/01/2006)   LDL: 85 (05/01/2006)   HDL: 67.6 (05/01/2006)   TG: 54 (05/01/2006)  Problem # 4:  CONGESTIVE HEART FAILURE (ICD-428.0) ? Right sided issues with pulmonary hypertension by echo. Normal LV  continue current dose of Lasix and aldactone Her updated medication list for this problem includes:    Spironolactone 25 Mg Tabs (Spironolactone) .Marland Kitchen... Take 1 tablet by mouth once a day    Diovan 160 Mg Tabs (Valsartan) .Marland Kitchen... 1 by mouth qd    Furosemide 40 Mg Tabs (Furosemide) .Marland Kitchen... Take 1 by mouth two times a day    Felodipine 2.5 Mg Tb24 (Felodipine) .Marland Kitchen... 1 by mouth once daily    Coumadin 2.5 Mg Tabs (Warfarin sodium) .Marland Kitchen... As diretced  Problem # 5:  COUMADIN THERAPY (ICD-V58.61) INR Rx with no bleeding.  F/U coumadin clinic.

## 2010-03-16 NOTE — Medication Information (Signed)
Summary: rov/tm  Anticoagulant Therapy  Managed by: Bethena Midget, RN, BSN Referring MD: Charlton Haws MD Supervising MD: Johney Frame MD, Fayrene Fearing Indication 1: Atrial Fibrillation (ICD-427.31) Lab Used: [facility] Site: [facility] INR POC 3.2 INR RANGE 2.0-3.0  Dietary changes: no    Health status changes: no    Bleeding/hemorrhagic complications: no    Recent/future hospitalizations: no    Any changes in medication regimen? no    Recent/future dental: no  Any missed doses?: no       Is patient compliant with meds? yes       Allergies: No Known Drug Allergies  Anticoagulation Management History:      The patient is taking warfarin and comes in today for a routine follow up visit.  Positive risk factors for bleeding include an age of 75 years or older and presence of serious comorbidities.  The bleeding index is 'intermediate risk'.  Positive CHADS2 values include History of CHF, History of HTN, and Age > 75 years old.  The start date was 04/10/2006.  Her last INR was 4.5 RATIO.  Anticoagulation responsible provider: Allred MD, Fayrene Fearing.  INR POC: 3.2.  Cuvette Lot#: [id number].  Exp: 11/2010.    Anticoagulation Management Assessment/Plan:      The patient's current anticoagulation dose is Coumadin 2.5 mg tabs: as diretced.  The target INR is 2 - 3.  The next INR is due 02/12/2010.  Anticoagulation instructions were given to patient/daughter.  Results were reviewed/authorized by Bethena Midget, RN, BSN.  She was notified by Bethena Midget, RN, BSN.         Prior Anticoagulation Instructions: INR 3.3 skip coumadin dose  tomorrow, then resume 2 pills everyday except 1 pill on Tuesdays, Thursdays and Saturdays. Recheck in 3 weeks.   Current Anticoagulation Instructions: INR 3.2 Skip Saturdays dose then change dose to 1 tablet everyday except 2 tablets on Mondays, Wednesdays and Fridays. Recheck in 3 weeks.

## 2010-03-16 NOTE — Medication Information (Signed)
Summary: rov/ewj  Anticoagulant Therapy  Managed by: Bethena Midget, RN, BSN Referring MD: Charlton Haws MD Supervising MD: Jens Som MD, Arlys John Indication 1: Atrial Fibrillation (ICD-427.31) Lab Used: [facility] Site: [facility] INR POC 1.8 INR RANGE 2.0-3.0  Dietary changes: no    Health status changes: no    Bleeding/hemorrhagic complications: no    Recent/future hospitalizations: no    Any changes in medication regimen? no    Recent/future dental: no  Any missed doses?: yes     Details: Daughter states she thinks pt. missed a dose  Is patient compliant with meds? yes       Allergies: No Known Drug Allergies  Anticoagulation Management History:      The patient is taking warfarin and comes in today for a routine follow up visit.  Positive risk factors for bleeding include an age of 75 years or older and presence of serious comorbidities.  The bleeding index is 'intermediate risk'.  Positive CHADS2 values include History of CHF, History of HTN, and Age > 85 years old.  The start date was 04/10/2006.  Her last INR was 4.5 RATIO.  Anticoagulation responsible provider: Jens Som MD, Arlys John.  INR POC: 1.8.  Cuvette Lot#: [id number].  Exp: 06/2010.    Anticoagulation Management Assessment/Plan:      The patient's current anticoagulation dose is Coumadin 2.5 mg tabs: as diretced.  The target INR is 2 - 3.  The next INR is due 05/07/2009.  Anticoagulation instructions were given to patient/daughter.  Results were reviewed/authorized by Bethena Midget, RN, BSN.  She was notified by Bethena Midget, RN, BSN.         Prior Anticoagulation Instructions: INR 2.2  Continue on same dosage 1 tablet daily except 2 tablets on Mondays, Wednesdays, and Fridays.  Recheck in 4 weeks.    Current Anticoagulation Instructions: INR 1.8 Today take extra 1 tablet, then resume 1 tablet everyday except 2 tablets on Mondays, Wednesdays and Fridays. Recheck in 3 weeks.

## 2010-03-16 NOTE — Medication Information (Signed)
Summary: rov/cb  Anticoagulant Therapy  Managed by: Elaina Pattee, PharmD Referring MD: Charlton Haws MD Supervising MD: Daleen Squibb MD, Maisie Fus Indication 1: Atrial Fibrillation (ICD-427.31) Lab Used: [facility] Site: [facility] INR POC 1.9 INR RANGE 2.0-3.0  Dietary changes: no    Health status changes: no    Bleeding/hemorrhagic complications: no    Recent/future hospitalizations: no    Any changes in medication regimen? no    Recent/future dental: no  Any missed doses?: yes     Details: pt daughter states that she has most likely missed a dose  Is patient compliant with meds? yes       Allergies: No Known Drug Allergies  Anticoagulation Management History:      The patient is taking warfarin and comes in today for a routine follow up visit.  Positive risk factors for bleeding include an age of 75 years or older and presence of serious comorbidities.  The bleeding index is 'intermediate risk'.  Positive CHADS2 values include History of CHF, History of HTN, and Age > 4 years old.  The start date was 04/10/2006.  Her last INR was 4.5 RATIO.  Anticoagulation responsible provider: Daleen Squibb MD, Maisie Fus.  INR POC: 1.9.  Cuvette Lot#: [id number].  Exp: 11/2010.    Anticoagulation Management Assessment/Plan:      The patient's current anticoagulation dose is Coumadin 2.5 mg tabs: as diretced.  The target INR is 2 - 3.  The next INR is due 09/25/2009.  Anticoagulation instructions were given to patient/daughter.  Results were reviewed/authorized by Elaina Pattee, PharmD.  She was notified by Dillard Cannon .         Prior Anticoagulation Instructions: INR 2.8. Take 2 tablets daily except 1 tablet on Tues, Thurs, Sat. Recheck in 4 weeks.  Current Anticoagulation Instructions: INR 1.9  Take an extra 1 tab today.  Then continue same dose of 2 tabs daily except for 1 tab on Tuesday, Thursday, and Saturday.  Re-check in  3 weeks.

## 2010-03-16 NOTE — Medication Information (Signed)
Summary: rov/eac  Anticoagulant Therapy  Managed by: Weston Brass, PharmD Referring MD: Charlton Haws MD Supervising MD: Juanda Chance MD, Bruce Indication 1: Atrial Fibrillation (ICD-427.31) Lab Used: [facility] Site: [facility] INR POC 1.4 INR RANGE 2.0-3.0  Dietary changes: no    Health status changes: no    Bleeding/hemorrhagic complications: no    Recent/future hospitalizations: no    Any changes in medication regimen? no    Recent/future dental: no  Any missed doses?: yes     Details: MIssed 2 doses.   Is patient compliant with meds? yes       Allergies: No Known Drug Allergies  Anticoagulation Management History:      The patient is taking warfarin and comes in today for a routine follow up visit.  Positive risk factors for bleeding include an age of 75 years or older and presence of serious comorbidities.  The bleeding index is 'intermediate risk'.  Positive CHADS2 values include History of CHF, History of HTN, and Age > 30 years old.  The start date was 04/10/2006.  Her last INR was 4.5 RATIO.  Anticoagulation responsible provider: Juanda Chance MD, Smitty Cords.  INR POC: 1.4.  Cuvette Lot#: [id number].  Exp: 12/2010.    Anticoagulation Management Assessment/Plan:      The patient's current anticoagulation dose is Coumadin 2.5 mg tabs: as diretced.  The target INR is 2 - 3.  The next INR is due 12/04/2009.  Anticoagulation instructions were given to patient/daughter.  Results were reviewed/authorized by Weston Brass, PharmD.  She was notified by Ilean Skill D candidate.         Prior Anticoagulation Instructions: INR 2.6  Continue taking 1 tablet on Tuesday, Thursday, and Saturday and 2 tablets all other days.  Return to clinic in 4 weeks.    Current Anticoagulation Instructions: INR 1.4  Take an extra tablet today and 2 tablets tomorrow. Then continue current dose of 2 tablets everyday except 1 tablet on Tuesday, Thursday, and Saturday. Recheck in 2 weeks.

## 2010-03-16 NOTE — Medication Information (Signed)
Summary: rov/tm  Anticoagulant Therapy  Managed by: Bethena Midget, RN, BSN Referring MD: Charlton Haws MD Supervising MD: Daleen Squibb MD, Maisie Fus Indication 1: Atrial Fibrillation (ICD-427.31) Lab Used: [facility] Site: [facility] INR POC 1.8 INR RANGE 2.0-3.0  Dietary changes: no    Health status changes: no    Bleeding/hemorrhagic complications: no    Recent/future hospitalizations: no    Any changes in medication regimen? no    Recent/future dental: no  Any missed doses?: no       Is patient compliant with meds? yes       Allergies: No Known Drug Allergies  Anticoagulation Management History:      The patient is taking warfarin and comes in today for a routine follow up visit.  Positive risk factors for bleeding include an age of 75 years or older and presence of serious comorbidities.  The bleeding index is 'intermediate risk'.  Positive CHADS2 values include History of CHF, History of HTN, and Age > 53 years old.  The start date was 04/10/2006.  Her last INR was 4.5 RATIO.  Anticoagulation responsible provider: Daleen Squibb MD, Maisie Fus.  INR POC: 1.8.  Cuvette Lot#: [id number].  Exp: 07/2010.    Anticoagulation Management Assessment/Plan:      The patient's current anticoagulation dose is Coumadin 2.5 mg tabs: as diretced.  The target INR is 2 - 3.  The next INR is due 06/25/2009.  Anticoagulation instructions were given to patient/daughter.  Results were reviewed/authorized by Bethena Midget, RN, BSN.  She was notified by Bethena Midget, RN, BSN.         Prior Anticoagulation Instructions: INR 2.0 Continue 2 pills everyday except  1pill on Tuesdays, Thursdays and Sundays.   Current Anticoagulation Instructions: INR 1.8 Today take extra 2.5mg s then change dose to 5mg s everyday except 2.5mg s on Tuesdays and Thursdays. Recheck in 2 weeks.

## 2010-03-16 NOTE — Medication Information (Signed)
Summary: rov/ewj  Anticoagulant Therapy  Managed by: Teofilo Pod, RN Referring MD: Charlton Haws MD Supervising MD: Juanda Chance MD, Mirel Hundal Indication 1: Atrial Fibrillation (ICD-427.31) Lab Used: [facility] Site: [facility] INR POC 2.2 INR RANGE 2.0-3.0  Dietary changes: no    Health status changes: no    Bleeding/hemorrhagic complications: no    Recent/future hospitalizations: no    Any changes in medication regimen? no    Recent/future dental: no  Any missed doses?: no       Is patient compliant with meds? yes       Allergies (verified): No Known Drug Allergies  Anticoagulation Management History:      The patient is taking warfarin and comes in today for a routine follow up visit.  Positive risk factors for bleeding include an age of 92 years or older and presence of serious comorbidities.  The bleeding index is 'intermediate risk'.  Positive CHADS2 values include History of CHF, History of HTN, and Age > 6 years old.  The start date was 04/10/2006.  Her last INR was 4.5 RATIO.  Anticoagulation responsible provider: Juanda Chance MD, Smitty Cords.  INR POC: 2.2.  Cuvette Lot#: [id number].  Exp: 05/2010.    Anticoagulation Management Assessment/Plan:      The patient's current anticoagulation dose is Coumadin 2.5 mg tabs: as diretced.  The target INR is 2 - 3.  The next INR is due 04/16/2009.  Anticoagulation instructions were given to patient/daughter.  Results were reviewed/authorized by Teofilo Pod, RN.  She was notified by Cloyde Reams RN.         Prior Anticoagulation Instructions: INR 2.4 at goal (2-3) Continue the same dosage 2.5mg  tablet daily except for 5mg  on Mondays, Wednesdays and Fridays Recheck 4 weeks  Current Anticoagulation Instructions: INR 2.2  Continue on same dosage 1 tablet daily except 2 tablets on Mondays, Wednesdays, and Fridays.  Recheck in 4 weeks.

## 2010-03-16 NOTE — Medication Information (Signed)
Summary: rov/ewj  Anticoagulant Therapy  Managed by: Bethena Midget, RN, BSN Referring MD: Charlton Haws MD Supervising MD: Antoine Poche MD, Fayrene Fearing Indication 1: Atrial Fibrillation (ICD-427.31) Lab Used: [facility] Site: [facility] INR POC 3.3 INR RANGE 2.0-3.0  Dietary changes: no    Health status changes: no    Bleeding/hemorrhagic complications: no    Recent/future hospitalizations: no    Any changes in medication regimen? no    Recent/future dental: no  Any missed doses?: no       Is patient compliant with meds? yes       Allergies: No Known Drug Allergies  Anticoagulation Management History:      The patient is taking warfarin and comes in today for a routine follow up visit.  Positive risk factors for bleeding include an age of 75 years or older and presence of serious comorbidities.  The bleeding index is 'intermediate risk'.  Positive CHADS2 values include History of CHF, History of HTN, and Age > 18 years old.  The start date was 04/10/2006.  Her last INR was 4.5 RATIO.  Anticoagulation responsible provider: Antoine Poche MD, Fayrene Fearing.  INR POC: 3.3.  Cuvette Lot#: [id number].  Exp: 01/2011.    Anticoagulation Management Assessment/Plan:      The patient's current anticoagulation dose is Coumadin 2.5 mg tabs: as diretced.  The target INR is 2 - 3.  The next INR is due 01/22/2010.  Anticoagulation instructions were given to patient/daughter.  Results were reviewed/authorized by Bethena Midget, RN, BSN.  She was notified by Bethena Midget, RN, BSN.         Prior Anticoagulation Instructions: INR 2.9  Continue on same dosage 2 tablets daily except 1 tablet on Tuesdays, Thursdays, and Saturdays.  Recheck in 4 weeks.    Current Anticoagulation Instructions: INR 3.3 skip coumadin dose  tomorrow, then resume 2 pills everyday except 1 pill on Tuesdays, Thursdays and Saturdays. Recheck in 3 weeks.

## 2010-03-16 NOTE — Medication Information (Signed)
Summary: rov/eac  Anticoagulant Therapy  Managed by: Elaina Pattee, PharmD Referring MD: Charlton Haws MD Supervising MD: Tenny Craw MD, Gunnar Fusi Indication 1: Atrial Fibrillation (ICD-427.31) Lab Used: [facility] Site: [facility] INR POC 2.8 INR RANGE 2.0-3.0  Dietary changes: no    Health status changes: no    Bleeding/hemorrhagic complications: no    Recent/future hospitalizations: no    Any changes in medication regimen? no    Recent/future dental: no  Any missed doses?: no       Is patient compliant with meds? yes       Allergies: No Known Drug Allergies  Anticoagulation Management History:      The patient is taking warfarin and comes in today for a routine follow up visit.  Positive risk factors for bleeding include an age of 3 years or older and presence of serious comorbidities.  The bleeding index is 'intermediate risk'.  Positive CHADS2 values include History of CHF, History of HTN, and Age > 45 years old.  The start date was 04/10/2006.  Her last INR was 4.5 RATIO.  Anticoagulation responsible provider: Tenny Craw MD, Gunnar Fusi.  INR POC: 2.8.  Cuvette Lot#: [id number].  Exp: 09/2010.    Anticoagulation Management Assessment/Plan:      The patient's current anticoagulation dose is Coumadin 2.5 mg tabs: as diretced.  The target INR is 2 - 3.  The next INR is due 09/04/2009.  Anticoagulation instructions were given to patient/daughter.  Results were reviewed/authorized by Elaina Pattee, PharmD.  She was notified by Elaina Pattee, PharmD.         Prior Anticoagulation Instructions: INR 1.9  Take 2 tablets today.  Then return to normal dosing schedule of 1 tablet on Tuesday, Thursday, and Saturday, and 2 tablets all other days.  Return to clinic in 3 weeks.    Current Anticoagulation Instructions: INR 2.8. Take 2 tablets daily except 1 tablet on Tues, Thurs, Sat. Recheck in 4 weeks.

## 2010-03-16 NOTE — Progress Notes (Signed)
  Phone Note Call from Patient   Caller: Daughter Call For: [phone] Summary of Call: pt havins allergy symptoms wanted to know what could use OTC, informed can use Claritin, Allegra, Zyrtec all OTC now daughter says will try Zyrtec .Kandice Hams  May 08, 2009 3:35 PM  Initial call taken by: Kandice Hams,  May 08, 2009 3:35 PM

## 2010-03-16 NOTE — Letter (Signed)
Summary: Primary Care Appointment Letter  [facility]  [address] [place], [place] [address]   Phone: [phone]  Fax: [phone]    05/29/2009 MRN: [id number]  Sara Carlson [address] [place], [place]  [address]  Dear [name],   Your Primary Care Physician Latty E. Paz MD has indicated that:    __X_____it is time to schedule an appointment.  Please call our office @ [phone] to schedule an office visit with Dr. Drue Novel.     Thank you,    [facility] Scheduler

## 2010-03-16 NOTE — Medication Information (Signed)
Summary: rov/tm  Anticoagulant Therapy  Managed by: Eda Keys, PharmD Referring MD: Charlton Haws MD Supervising MD: Tenny Craw MD, Gunnar Fusi Indication 1: Atrial Fibrillation (ICD-427.31) Lab Used: [facility] Site: [facility] INR POC 2.6 INR RANGE 2.0-3.0  Dietary changes: no    Health status changes: no    Bleeding/hemorrhagic complications: no    Recent/future hospitalizations: no    Any changes in medication regimen? no    Recent/future dental: no  Any missed doses?: no       Is patient compliant with meds? yes       Allergies: No Known Drug Allergies  Anticoagulation Management History:      The patient is taking warfarin and comes in today for a routine follow up visit.  Positive risk factors for bleeding include an age of 75 years or older and presence of serious comorbidities.  The bleeding index is 'intermediate risk'.  Positive CHADS2 values include History of CHF, History of HTN, and Age > 8 years old.  The start date was 04/10/2006.  Her last INR was 4.5 RATIO.  Anticoagulation responsible Kasim Mccorkle: Tenny Craw MD, Gunnar Fusi.  INR POC: 2.6.  Cuvette Lot#: [id number].  Exp: 12/2010.    Anticoagulation Management Assessment/Plan:      The patient's current anticoagulation dose is Coumadin 2.5 mg tabs: as diretced.  The target INR is 2 - 3.  The next INR is due 11/20/2009.  Anticoagulation instructions were given to patient/daughter.  Results were reviewed/authorized by Eda Keys, PharmD.  She was notified by Eda Keys, PharmD.         Prior Anticoagulation Instructions: INR 2.4 Continue 2 pills everyday except 1 pill on Tuesdays, Thursdays and Saturdays. Recheck in 4 weeks.   Current Anticoagulation Instructions: INR 2.6  Continue taking 1 tablet on Tuesday, Thursday, and Saturday and 2 tablets all other days.  Return to clinic in 4 weeks.

## 2010-03-16 NOTE — Medication Information (Signed)
Summary: rov/tm  Anticoagulant Therapy  Managed by: Teofilo Pod, RN Referring MD: Charlton Haws MD Supervising MD: Excell Seltzer MD, Casimiro Needle Indication 1: Atrial Fibrillation (ICD-427.31) Lab Used: [facility]  Site: [facility] INR POC 2.4 INR RANGE 2.0-3.0  Dietary changes: no    Health status changes: no    Bleeding/hemorrhagic complications: no    Recent/future hospitalizations: no    Any changes in medication regimen? no    Recent/future dental: no  Any missed doses?: no       Is patient compliant with meds? yes       Allergies (verified): No Known Drug Allergies  Anticoagulation Management History:      The patient is taking warfarin and comes in today for a routine follow up visit.  Positive risk factors for bleeding include an age of 10 years or older and presence of serious comorbidities.  The bleeding index is 'intermediate risk'.  Positive CHADS2 values include History of CHF, History of HTN, and Age > 56 years old.  The start date was 04/10/2006.  Her last INR was 4.5 RATIO.  Anticoagulation responsible provider: Excell Seltzer MD, Casimiro Needle.  INR POC: 2.4.  Cuvette Lot#: [id number].  Exp: 03/2010.    Anticoagulation Management Assessment/Plan:      The patient's current anticoagulation dose is Coumadin 2.5 mg tabs: as diretced.  The target INR is 2 - 3.  The next INR is due 03/19/2009.  Anticoagulation instructions were given to patient/daughter.  Results were reviewed/authorized by Teofilo Pod, RN.  She was notified by Ysidro Evert, Pharm D Candidate.         Prior Anticoagulation Instructions: INR 2.0 Continue 1 pill everyday except 2 pills on Mondays, Wednesdays and Fridays. Recheck in 4 weeks. 02/19/09- With MD appt.   Current Anticoagulation Instructions: INR 2.4 at goal (2-3) Continue the same dosage 2.5mg  tablet daily except for 5mg  on Mondays, Wednesdays and Fridays Recheck 4 weeks

## 2010-03-16 NOTE — Medication Information (Signed)
Summary: rov/ln  Anticoagulant Therapy  Managed by: Bethena Midget, RN, BSN Referring MD: Charlton Haws MD Supervising MD: Tenny Craw MD, Gunnar Fusi Indication 1: Atrial Fibrillation (ICD-427.31) Lab Used: [facility] Site: [facility] INR POC 2.4 INR RANGE 2.0-3.0  Dietary changes: no    Health status changes: no    Bleeding/hemorrhagic complications: no    Recent/future hospitalizations: no    Any changes in medication regimen? yes       Details: Namenda daily for mth then BID   Recent/future dental: no  Any missed doses?: no       Is patient compliant with meds? yes       Allergies: No Known Drug Allergies  Anticoagulation Management History:      Positive risk factors for bleeding include an age of 23 years or older and presence of serious comorbidities.  The bleeding index is 'intermediate risk'.  Positive CHADS2 values include History of CHF, History of HTN, and Age > 31 years old.  The start date was 04/10/2006.  Her last INR was 4.5 RATIO.  Anticoagulation responsible provider: Tenny Craw MD, Gunnar Fusi.  INR POC: 2.4.  Cuvette Lot#: [id number].  Exp: 11/2010.    Anticoagulation Management Assessment/Plan:      The patient's current anticoagulation dose is Coumadin 2.5 mg tabs: as diretced.  The target INR is 2 - 3.  The next INR is due 10/23/2009.  Anticoagulation instructions were given to patient/daughter.  Results were reviewed/authorized by Bethena Midget, RN, BSN.  She was notified by Bethena Midget, RN, BSN.         Prior Anticoagulation Instructions: INR 1.9  Take an extra 1 tab today.  Then continue same dose of 2 tabs daily except for 1 tab on Tuesday, Thursday, and Saturday.  Re-check in  3 weeks.   Current Anticoagulation Instructions: INR 2.4 Continue 2 pills everyday except 1 pill on Tuesdays, Thursdays and Saturdays. Recheck in 4 weeks.

## 2010-03-18 ENCOUNTER — Ambulatory Visit: Admit: 2010-03-18 | Payer: Self-pay | Admitting: Internal Medicine

## 2010-03-18 ENCOUNTER — Encounter: Payer: Self-pay | Admitting: Internal Medicine

## 2010-03-18 ENCOUNTER — Encounter (INDEPENDENT_AMBULATORY_CARE_PROVIDER_SITE_OTHER): Payer: PRIVATE HEALTH INSURANCE | Admitting: Internal Medicine

## 2010-03-18 DIAGNOSIS — F0390 Unspecified dementia without behavioral disturbance: Secondary | ICD-10-CM

## 2010-03-18 DIAGNOSIS — I10 Essential (primary) hypertension: Secondary | ICD-10-CM

## 2010-03-18 DIAGNOSIS — Z23 Encounter for immunization: Secondary | ICD-10-CM

## 2010-03-18 DIAGNOSIS — Z0000 Encounter for general adult medical examination without abnormal findings: Secondary | ICD-10-CM

## 2010-03-18 NOTE — Medication Information (Signed)
Summary: rov/tm  Anticoagulant Therapy  Managed by: Bethena Midget, RN, BSN Referring MD: Charlton Haws MD Supervising MD: Tenny Craw MD, Gunnar Fusi Indication 1: Atrial Fibrillation (ICD-427.31) Lab Used: [facility]  Site: [facility] INR POC 3.7 INR RANGE 2.0-3.0  Dietary changes: no    Health status changes: no    Bleeding/hemorrhagic complications: no    Recent/future hospitalizations: no    Any changes in medication regimen? no    Recent/future dental: no  Any missed doses?: no       Is patient compliant with meds? yes       Allergies: No Known Drug Allergies  Anticoagulation Management History:      The patient is taking warfarin and comes in today for a routine follow up visit.  Positive risk factors for bleeding include an age of 4 years or older and presence of serious comorbidities.  The bleeding index is 'intermediate risk'.  Positive CHADS2 values include History of CHF, History of HTN, and Age > 54 years old.  The start date was 04/10/2006.  Her last INR was 4.5 RATIO.  Anticoagulation responsible provider: Tenny Craw MD, Gunnar Fusi.  INR POC: 3.7.  Cuvette Lot#: [id number].  Exp: 02/2011.    Anticoagulation Management Assessment/Plan:      The patient's current anticoagulation dose is Coumadin 2.5 mg tabs: as diretced.  The target INR is 2 - 3.  The next INR is due 04/02/2010.  Anticoagulation instructions were given to patient/daughter.  Results were reviewed/authorized by Bethena Midget, RN, BSN.  She was notified by Bethena Midget, RN, BSN.         Prior Anticoagulation Instructions: INR 2.6 Continue 1  tablet everyday except 2 tablets on Mondays, Wednesdays and Fridays. Recheck in 4 weeks.   Current Anticoagulation Instructions: INR 3.7 Skip today's dose then resume 1 pill everyday except 2 pills on Mondays, Wednesdays and Fridays. Recheck in 3 weeks.

## 2010-03-18 NOTE — Progress Notes (Signed)
Summary: labs?  Phone Note Outgoing Call   Summary of Call: patient was seen 09/2009, labs were ordered. Results? If for whatever reason  labs were not done---->  please ring the patient back for blood work. Darris Carachure E. Nathalie Cavendish MD  February 23, 2010 5:02 PM   Follow-up for Phone Call        Spoke w/  pts daugther she did not have bloodwork done. She is coming in for CPX on 03/04/10. Army Fossa CMA  February 24, 2010 1:34 PM

## 2010-03-18 NOTE — Medication Information (Signed)
Summary: rov/tm  Anticoagulant Therapy  Managed by: Bethena Midget, RN, BSN Referring MD: Charlton Haws MD Supervising MD: Clifton James MD, Cristal Deer Indication 1: Atrial Fibrillation (ICD-427.31) Lab Used: [facility] Site: [facility] INR POC 2.6 INR RANGE 2.0-3.0  Dietary changes: no    Health status changes: no    Bleeding/hemorrhagic complications: no    Recent/future hospitalizations: no    Any changes in medication regimen? no    Recent/future dental: no  Any missed doses?: no       Is patient compliant with meds? yes       Allergies: No Known Drug Allergies  Anticoagulation Management History:      The patient is taking warfarin and comes in today for a routine follow up visit.  Positive risk factors for bleeding include an age of 75 years or older and presence of serious comorbidities.  The bleeding index is 'intermediate risk'.  Positive CHADS2 values include History of CHF, History of HTN, and Age > 4 years old.  The start date was 04/10/2006.  Her last INR was 4.5 RATIO.  Anticoagulation responsible provider: Clifton James MD, Cristal Deer.  INR POC: 2.6.  Cuvette Lot#: [id number].  Exp: 03/2011.    Anticoagulation Management Assessment/Plan:      The patient's current anticoagulation dose is Coumadin 2.5 mg tabs: as diretced.  The target INR is 2 - 3.  The next INR is due 03/12/2010.  Anticoagulation instructions were given to patient/daughter.  Results were reviewed/authorized by Bethena Midget, RN, BSN.  She was notified by Bethena Midget, RN, BSN.         Prior Anticoagulation Instructions: INR 3.2 Skip Saturdays dose then change dose to 1 tablet everyday except 2 tablets on Mondays, Wednesdays and Fridays. Recheck in 3 weeks.   Current Anticoagulation Instructions: INR 2.6 Continue 1  tablet everyday except 2 tablets on Mondays, Wednesdays and Fridays. Recheck in 4 weeks.

## 2010-03-24 NOTE — Assessment & Plan Note (Signed)
Summary: CPX AND FASTING LABS//PH   Vital Signs:  Patient profile:   77 year old female Height:      61.25 inches Weight:      172.25 pounds Pulse rate:   80 / minute Pulse rhythm:   regular BP sitting:   130 / 86  (left arm) Cuff size:   large  Vitals Entered By: Army Fossa CMA (March 18, 2010 9:49 AM) CC: CPX, not fasting  Comments no complaints  [facility] [address] not had colonoscopy, pap, or mammo discuss flu shot    History of Present Illness: Here for Medicare AWV: here w/ her daughter   1.   Risk factors based on Past M, S, F history: reviewed  2.   Physical Activities: home chores, walks inside her complex , occasionally goes outside  3.   Depression/mood: admits to occasionally depresssive mood, still likes to go back to [place].                      Medicines? "that would not solve the problem", no suicidal 4.   Hearing: no problems noted or reported  5.   ADL's: lives by self, meals are provided by daughter, afraid to take a shower, still lives at an                       independent apartment  6.   Fall Risk: no recent falls, prevention discussed  7.   Home Safety: occasionally feels shaky at night, daughter reports her apartment is safe  8.   Height, weight, &visual acuity: see vs, no problems noted or reported  9.   Counseling: yes  10.   Labs ordered based on risk factors: yes  11.           Referral Coordination, if needed  12.           Care Plan, see a/p 13.            Cognitive Assessment: has dementia, see below    in addition, we discussed the following issues  Hyperlipidemia-- on diet only (diet is healthy) Hypertension-- ambulatory BPs "ok" anxiety-depression-- see above  dementia--  started namenda few months ago, no s/e ; [place] reports memory is slightly  better  WEIGHT LOSS  -- no problems , see VS ATRIAL FIBRILLATION, on coumadin, per cardiology       Current Medications (verified): 1)  Spironolactone 25 Mg Tabs  (Spironolactone) .... Take 1 Tablet By Mouth Once A Day. Due For Cpx. 2)  Diovan 160 Mg  Tabs (Valsartan) .Marland Kitchen.. 1 By Mouth Once Daily. Due For Cpx. 3)  Furosemide 40 Mg Tabs (Furosemide) .... Take 1 By Mouth Two Times A Day 4)  Cardura 8 Mg  Tabs (Doxazosin Mesylate) .Marland Kitchen.. 1 By Mouth Once Daily 5)  Felodipine 2.5 Mg  Tb24 (Felodipine) .Marland Kitchen.. 1 By Mouth Once Daily 6)  Systane Ultra 0.4-0.3 % Soln (Polyethyl Glycol-Propyl Glycol) .... As Diretced 7)  Aricept 10 Mg Tabs (Donepezil Hcl) .Marland Kitchen.. 1 By Mouth Once Daily 8)  Coumadin 2.5 Mg Tabs (Warfarin Sodium) .... As Diretced 9)  Claritin Reditabs 5 Mg Tbdp (Loratadine) .... One A Day As Needed For Allergies 10)  Namenda 5 Mg Tabs (Memantine Hcl) .Marland Kitchen.. 1 By Mouth Once Daily X 10 Days, Then 1 By Mouth Two Times A Day  Allergies (verified): No Known Drug Allergies  Past History:  Past Medical History: Congestive heart failure  A. Fib, paroxismal Hyperlipidemia Hypertension anxiety-depression dementia ALLERGIC RHINITIS    Past Surgical History: Reviewed history from 04/01/2008 and no changes required. Hysterectomy, no oophorectomy   Family History: Reviewed history from 09/15/2009 and no changes required. colon ca--no breast ca--no DM--no CAD--no  Social History: Moved from [place] to [place] Feb. 2008 lives in her appartment "independent living" ADLs: independent, see HPI doesn't drive Daughter [name] is a friend of Dr. Eden Emms and works at [employer] day care. no tobacco ETOH--  wine sometimes   Review of Systems CV:  Denies chest pain or discomfort and swelling of feet. Resp:  Denies cough and shortness of breath. GI:  Denies bloody stools, nausea, and vomiting. GU:  Denies abnormal vaginal bleeding and dysuria.  Physical Exam  General:  alert, well-developed, and well-nourished.   Neck:  no masses, no thyromegaly, and normal carotid upstroke.   Lungs:  normal respiratory effort, no intercostal retractions, no accessory muscle use,  and normal breath sounds.   Heart:  irregular, ventricular rate about 90/minute Abdomen:  soft, non-tender, and no distention.  no hepatomegaly and no splenomegaly.   Neurologic:  alert, pleasant, cooperative   Psych:  not anxious appearing and not depressed appearing.  actually seems in very  good spirits   Impression & Recommendations:  Problem # 1:  HEALTH SCREENING (ICD-V70.0)  Td 2009 Pneumonia shot 2007 shingles shot-- benefits discussed , provided a Rx  flu shot-- today   last female exam 2008 before she moved to [place]; no h/o abnormal PAPs doesn't recall having a Cscope ever had DEXA before she moved here in 2008 I discussed with her the concept of screening  for breast and colon cancer, osteoporosis. She is not interested her daughter will discuss this issue  with her further will let me know if the pt  is interested  I did recommend strongly a bone density test  rec Ca and Vit D  recommend to stay active and continue to eat healthy  Orders: Medicare -1st Annual Wellness Visit [id number])  Problem # 2:  ATRIAL FIBRILLATION, PAROXYSMAL (ICD-427.31) Per cardiology Her updated medication list for this problem includes:    Coumadin 2.5 Mg Tabs (Warfarin sodium) .Marland Kitchen... As diretced    Felodipine 2.5 Mg Tb24 (Felodipine) .Marland Kitchen... 1 by mouth once daily  Problem # 3:  DEMENTIA, SENILE, UNCOMPLICATED (ICD-290.0) started on Namenda few months ago, tolerated well, her memory per family report somehow improved. Reassess in 4 months, will need a formal mental exam then  Problem # 4:  HYPERLIPIDEMIA (ICD-272.4) labs Labs Reviewed: SGOT: 24 (06/25/2008)   SGPT: 12 (06/25/2008)   HDL:67.6 (05/01/2006)  LDL:85 (05/01/2006)  Chol:163 (05/01/2006)  Trig:54 (05/01/2006)  Problem # 5:  HYPERTENSION (ICD-401.9) at goal  Her updated medication list for this problem includes:    Spironolactone 25 Mg Tabs (Spironolactone) .Marland Kitchen... Take 1 tablet by mouth once a day. due for cpx.    Diovan 160 Mg  Tabs (Valsartan) .Marland Kitchen... 1 by mouth once daily. due for cpx.    Furosemide 40 Mg Tabs (Furosemide) .Marland Kitchen... Take 1 by mouth two times a day    Cardura 8 Mg Tabs (Doxazosin mesylate) .Marland Kitchen... 1 by mouth once daily    Felodipine 2.5 Mg Tb24 (Felodipine) .Marland Kitchen... 1 by mouth once daily  BP today: 130/86 Prior BP: 136/76 (09/15/2009)  Labs Reviewed: K+: 4.4 (06/25/2008) Creat: : 1.6 (06/25/2008)   Chol: 163 (05/01/2006)   HDL: 67.6 (05/01/2006)   LDL: 85 (05/01/2006)   TG: 54 (05/01/2006)  Problem #  6:  ANXIETY DEPRESSION (ICD-300.4) stable, does not desire medicines   Complete Medication List: 1)  Coumadin 2.5 Mg Tabs (Warfarin sodium) .... As diretced 2)  Spironolactone 25 Mg Tabs (Spironolactone) .... Take 1 tablet by mouth once a day. due for cpx. 3)  Diovan 160 Mg Tabs (Valsartan) .Marland Kitchen.. 1 by mouth once daily. due for cpx. 4)  Furosemide 40 Mg Tabs (Furosemide) .... Take 1 by mouth two times a day 5)  Cardura 8 Mg Tabs (Doxazosin mesylate) .Marland Kitchen.. 1 by mouth once daily 6)  Felodipine 2.5 Mg Tb24 (Felodipine) .Marland Kitchen.. 1 by mouth once daily 7)  Systane Ultra 0.4-0.3 % Soln (Polyethyl glycol-propyl glycol) .... As diretced 8)  Aricept 10 Mg Tabs (Donepezil hcl) .Marland Kitchen.. 1 by mouth once daily 9)  Namenda 5 Mg Tabs (Memantine hcl) .Marland Kitchen.. 1 by mouth once daily x 10 days, then 1 by mouth two times a day 10)  Claritin Reditabs 5 Mg Tbdp (Loratadine) .... One a day as needed for allergies 11)  Zostavax 98119 Unt/0.83ml Solr (Zoster vaccine live) .Marland Kitchen.. 1 s.q. injection 12)  Calcium 1 G and Vitamin D 600 Units Daily   Other Orders: Flu Vaccine 79yrs + (14782) Admin 1st Vaccine (95621)  Patient Instructions: 1)  please come back fasting 2)  FLP AST ALT---dx and high cholesterol 3)  BMP---- dx  hypertension 4)  TSH CBC--- dx atrial fibrillation 5)  Vitamin D---- dx  screening for vitamins deficiencies 6)  Please schedule a follow-up appointment in 4 months .  Prescriptions: ZOSTAVAX [id number] UNT/0.65ML SOLR (ZOSTER  VACCINE LIVE) 1 s.q. injection  #1 x 0   Entered and Authorized by:   Nolon Rod. Elmor Kost MD   Signed by:   Nolon Rod. Allysia Ingles MD on 03/18/2010   Method used:   Print then Give to Patient   RxID:   [id number] CARDURA 8 MG  TABS (DOXAZOSIN MESYLATE) 1 by mouth once daily  #90 Tablet x 1   Entered by:   Army Fossa CMA   Authorized by:   Nolon Rod. Aldahir Litaker MD   Signed by:   Army Fossa CMA on 03/18/2010   Method used:   Electronically to        [id number]* (retail)       [address]       [place], [place]  [address]       Ph: [phone] or [phone]       Fax: [phone]   RxID:   [id number] DIOVAN 160 MG  TABS (VALSARTAN) 1 by mouth once dailY. DUE FOR CPX.  #30 x 3   Entered by:   Army Fossa CMA   Authorized by:   Nolon Rod. Bryleigh Ottaway MD   Signed by:   Army Fossa CMA on 03/18/2010   Method used:   Electronically to        [id number]* (retail)       [address]       [place], [place]  [address]       Ph: [phone] or [phone]       Fax: [phone]   RxID:   [id number]    Orders Added: 1)  Flu Vaccine 20yrs + [00938] 2)  Admin 1st Vaccine [90471] 3)  Medicare -1st Annual Wellness Visit [G0438] 4)  Est. Patient Level III [18299]   Immunizations Administered:  Influenza  Vaccine # 1:    Vaccine Type: Fluvax 3+    Site: right deltoid    Mfr: Sanofi Pasteur    Dose: 0.5 ml    Route: IM    Given by: Army Fossa CMA    Exp. Date: 08/14/2010    Lot #: [id number]   Immunizations Administered:  Influenza Vaccine # 1:    Vaccine Type: Fluvax 3+    Site: right deltoid    Mfr: Sanofi Pasteur    Dose: 0.5 ml    Route: IM    Given by: Army Fossa CMA    Exp. Date: 08/14/2010    Lot #: [id number]

## 2010-04-02 ENCOUNTER — Encounter: Payer: Self-pay | Admitting: Cardiology

## 2010-04-02 ENCOUNTER — Encounter (INDEPENDENT_AMBULATORY_CARE_PROVIDER_SITE_OTHER): Payer: PRIVATE HEALTH INSURANCE

## 2010-04-02 DIAGNOSIS — Z7901 Long term (current) use of anticoagulants: Secondary | ICD-10-CM

## 2010-04-02 DIAGNOSIS — I4891 Unspecified atrial fibrillation: Secondary | ICD-10-CM

## 2010-04-02 LAB — CONVERTED CEMR LAB: POC INR: 2.3

## 2010-04-07 NOTE — Medication Information (Signed)
Summary: rov/tm  Anticoagulant Therapy  Managed by: Bethena Midget, RN, BSN Referring MD: Charlton Haws MD Supervising MD: Daleen Squibb MD, Maisie Fus Indication 1: Atrial Fibrillation (ICD-427.31) Lab Used: [facility] Site: [facility] INR POC 2.3 INR RANGE 2.0-3.0  Dietary changes: no    Health status changes: no    Bleeding/hemorrhagic complications: no    Recent/future hospitalizations: no    Any changes in medication regimen? no    Recent/future dental: no  Any missed doses?: no       Is patient compliant with meds? yes       Allergies: No Known Drug Allergies  Anticoagulation Management History:      The patient is taking warfarin and comes in today for a routine follow up visit.  Positive risk factors for bleeding include an age of 75 years or older and presence of serious comorbidities.  The bleeding index is 'intermediate risk'.  Positive CHADS2 values include History of CHF, History of HTN, and Age > 19 years old.  The start date was 04/10/2006.  Her last INR was 4.5 RATIO.  Anticoagulation responsible provider: Daleen Squibb MD, Maisie Fus.  INR POC: 2.3.  Cuvette Lot#: [id number].  Exp: 02/2011.    Anticoagulation Management Assessment/Plan:      The patient's current anticoagulation dose is Coumadin 2.5 mg tabs: as diretced.  The target INR is 2 - 3.  The next INR is due 04/30/2010.  Anticoagulation instructions were given to patient/daughter.  Results were reviewed/authorized by Bethena Midget, RN, BSN.  She was notified by Bethena Midget, RN, BSN.         Prior Anticoagulation Instructions: INR 3.7 Skip today's dose then resume 1 pill everyday except 2 pills on Mondays, Wednesdays and Fridays. Recheck in 3 weeks.   Current Anticoagulation Instructions: INR 2.3 Continue 1 tablet everyday except 2 tablets on Mondays, Wednesdays and Fridays. Recheck in 4 weeks.

## 2010-04-22 ENCOUNTER — Encounter: Payer: Self-pay | Admitting: Cardiovascular Disease

## 2010-04-22 DIAGNOSIS — I4891 Unspecified atrial fibrillation: Secondary | ICD-10-CM

## 2010-04-30 ENCOUNTER — Encounter: Payer: Self-pay | Admitting: Cardiology

## 2010-04-30 ENCOUNTER — Encounter (INDEPENDENT_AMBULATORY_CARE_PROVIDER_SITE_OTHER): Payer: PRIVATE HEALTH INSURANCE

## 2010-04-30 DIAGNOSIS — I4891 Unspecified atrial fibrillation: Secondary | ICD-10-CM

## 2010-04-30 DIAGNOSIS — Z7901 Long term (current) use of anticoagulants: Secondary | ICD-10-CM

## 2010-04-30 LAB — CONVERTED CEMR LAB: POC INR: 1.9

## 2010-05-03 ENCOUNTER — Telehealth (INDEPENDENT_AMBULATORY_CARE_PROVIDER_SITE_OTHER): Payer: Self-pay | Admitting: *Deleted

## 2010-05-04 NOTE — Medication Information (Signed)
Summary: rov/tm  Anticoagulant Therapy  Managed by: Samantha Crimes, PharmD Referring MD: Charlton Haws MD Supervising MD: Shirlee Latch MD, Sheri Gatchel Indication 1: Atrial Fibrillation (ICD-427.31) Lab Used: [facility] Site: [facility] INR POC 1.9 INR RANGE 2.0-3.0  Dietary changes: no    Health status changes: no    Bleeding/hemorrhagic complications: no    Recent/future hospitalizations: no    Any changes in medication regimen? no    Recent/future dental: no  Any missed doses?: no       Is patient compliant with meds? yes       Current Medications (verified): 1)  Coumadin 2.5 Mg Tabs (Warfarin Sodium) .... As Diretced 2)  Spironolactone 25 Mg Tabs (Spironolactone) .... Take 1 Tablet By Mouth Once A Day. Due For Cpx. 3)  Diovan 160 Mg  Tabs (Valsartan) .Marland Kitchen.. 1 By Mouth Once Daily. Due For Cpx. 4)  Furosemide 40 Mg Tabs (Furosemide) .... Take 1 By Mouth Two Times A Day 5)  Cardura 8 Mg  Tabs (Doxazosin Mesylate) .Marland Kitchen.. 1 By Mouth Once Daily 6)  Felodipine 2.5 Mg  Tb24 (Felodipine) .Marland Kitchen.. 1 By Mouth Once Daily 7)  Systane Ultra 0.4-0.3 % Soln (Polyethyl Glycol-Propyl Glycol) .... As Diretced 8)  Aricept 10 Mg Tabs (Donepezil Hcl) .Marland Kitchen.. 1 By Mouth Once Daily 9)  Namenda 5 Mg Tabs (Memantine Hcl) .Marland Kitchen.. 1 By Mouth Once Daily X 10 Days, Then 1 By Mouth Two Times A Day 10)  Claritin Reditabs 5 Mg Tbdp (Loratadine) .... One A Day As Needed For Allergies 11)  Zostavax 60454 Unt/0.56ml Solr (Zoster Vaccine Live) .Marland Kitchen.. 1 S.q. Injection 12)  Calcium 1 G and Vitamin D 600 Units Daily  Allergies (verified): No Known Drug Allergies  Anticoagulation Management History:      Positive risk factors for bleeding include an age of 75 years or older and presence of serious comorbidities.  The bleeding index is 'intermediate risk'.  Positive CHADS2 values include History of CHF, History of HTN, and Age > 75 years old.  The start date was 04/10/2006.  Her last INR was 4.5 RATIO.  Anticoagulation responsible  provider: Shirlee Latch MD, Brendaly Townsel.  INR POC: 1.9.  Exp: 02/2011.    Anticoagulation Management Assessment/Plan:      The patient's current anticoagulation dose is Coumadin 2.5 mg tabs: as diretced.  The target INR is 2 - 3.  The next INR is due 04/30/2010.  Anticoagulation instructions were given to patient/daughter.  Results were reviewed/authorized by Samantha Crimes, PharmD.         Prior Anticoagulation Instructions: INR 2.3 Continue 1 tablet everyday except 2 tablets on Mondays, Wednesdays and Fridays. Recheck in 4 weeks.  Current Anticoagulation Instructions: return to the clinic in 4 weeks on 4/12 @ 1030 cont with current regimen

## 2010-05-13 NOTE — Progress Notes (Signed)
Summary: referral to [facility]   Phone Note Call from Patient Call back at [phone]   Caller: Daughter- [name] Summary of Call: Pt daughter says they have spoken w/ [facility] about referral and just needed ok and to have 3 times a week for a few hours to help patient stay active and just provided overall assistance. Initial call taken by: Doristine Devoid CMA,  May 03, 2010 11:13 AM  Follow-up for Phone Call        agree , please do Jose E. Paz MD  May 03, 2010 12:58 PM      Appended Document: referral to [facility]  spoke w/ daughter [name] informed referral done

## 2010-05-27 ENCOUNTER — Ambulatory Visit (INDEPENDENT_AMBULATORY_CARE_PROVIDER_SITE_OTHER): Payer: PRIVATE HEALTH INSURANCE | Admitting: *Deleted

## 2010-05-27 DIAGNOSIS — I4891 Unspecified atrial fibrillation: Secondary | ICD-10-CM

## 2010-05-27 DIAGNOSIS — Z7901 Long term (current) use of anticoagulants: Secondary | ICD-10-CM

## 2010-06-01 ENCOUNTER — Other Ambulatory Visit: Payer: Self-pay | Admitting: Cardiovascular Disease

## 2010-06-09 ENCOUNTER — Other Ambulatory Visit: Payer: Self-pay | Admitting: Internal Medicine

## 2010-06-10 ENCOUNTER — Ambulatory Visit (INDEPENDENT_AMBULATORY_CARE_PROVIDER_SITE_OTHER): Payer: PRIVATE HEALTH INSURANCE | Admitting: *Deleted

## 2010-06-10 DIAGNOSIS — I4891 Unspecified atrial fibrillation: Secondary | ICD-10-CM

## 2010-06-24 ENCOUNTER — Ambulatory Visit (INDEPENDENT_AMBULATORY_CARE_PROVIDER_SITE_OTHER): Payer: PRIVATE HEALTH INSURANCE | Admitting: *Deleted

## 2010-06-24 DIAGNOSIS — I4891 Unspecified atrial fibrillation: Secondary | ICD-10-CM

## 2010-06-29 NOTE — Assessment & Plan Note (Signed)
[facility] HEALTHCARE                            CARDIOLOGY OFFICE NOTE   LEYNA, VANDERKOLK                        MRN:          [id number]  DATE:08/07/2006                            DOB:          1922/08/30    Sara Carlson returns today for followup.  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  Siomara has a history of  nonischemic cardiomyopathy and atrial arrhythmias.  She has been stable.  She is not having significant palpitations or syncope.  She was seen by  Dr. [name] who wanted to monitor her and did not think she needed a  pacemaker and ablation.   She has no documented coronary disease.  She has been on chronic  Coumadin.   The patient has been doing fairly well.  She is active in the garden.  She is not having PND or orthopnea.  She is sleeping through the night.  Her lower extremity edema is stable.   REVIEW OF SYSTEMS:  Otherwise, negative.  She does need to follow up  with evaluation of her heart function.  I have records from [place],  [place], which showed an EF of 45% by echo.   MEDICATION LIST:  1. Coreg 25 b.i.d.  2. Coumadin as directed.  3. Diovan 320 a day.  4. Doxazosin 8 mg q.h.s.  5. Lasix 40 b.i.d.  6. Simvastatin 40 q.h.s.  7. Spironolactone 25 a day.  8. Aspirin a day.  9. Amiodarone 200 b.i.d.  10.Sertraline 25 a day.   PHYSICAL EXAMINATION:  VITAL SIGNS:  Blood pressure 160/80, pulse 52 and  regular, respiratory rate 14, weight 144, afebrile.  HEENT:  Normal.  NECK:  Carotids have no bruit.  There is no JVP elevation, no  lymphadenopathy, no thyromegaly.  LUNGS:  Clear with normal diaphragmatic motion, no wheezing.  HEART:  There is an S1, S2 with a systolic ejection murmur.  PMI is  mildly increased but not displaced.  ABDOMEN:  Benign.  Bowel sounds positive.  No hepatosplenomegaly, no  hepatojugular reflux, no AAA, no tenderness.  EXTREMITIES:  Femorals are +3 bilaterally without bruit.   PTs are +2.  There is trace lower extremity edema bilaterally.  NEUROLOGICAL:  Nonfocal.  SKIN:  Warm and dry.   IMPRESSION:  1. Nonischemic cardiomyopathy.  Followup echocardiogram to assess      congestive failure.  Continue current dose of diuretics.  2. History of paroxysmal atrial fibrillation.  Continue Coumadin and      amiodarone.  Follow up in the clinic.  3. Hypertension borderline controlled.  Take blood pressure readings      at home.  Continue doxazosin, Diovan, and diuretics.  May need to      alter medications in future.  Her daughter will call me pending her      home readings.   Overall I think Debarah Crape is doing well so long as her EF is as advertised  in the 40% range, and her blood pressure runs well at home.  I will see  her back in six months.  [name]. Eden Emms, MD, [facility]  Electronically Signed    PCN/MedQ  DD: 08/07/2006  DT: 08/07/2006  Job #: [id number]

## 2010-06-29 NOTE — Assessment & Plan Note (Signed)
[facility] HEALTHCARE                            CARDIOLOGY OFFICE NOTE   ARORA, COAKLEY                        MRN:          [id number]  DATE:04/27/2007                            DOB:          Jul 29, 1922    HISTORY:  Sara Carlson returns today for followup.  She has atrial  fibrillation.  She is still a good Coumadin candidate despite her age.  She walks with a cane, but does not fall.  She has had a history of  diastolic heart failure.  Her last echo in July 2008 showed normal LV  function.  She has been doing fairly well.  She has lost a bit of weight  and has a poor appetite.  She was started on Zoloft which seems to help  her mood, but not her appetite.  From a cardiac perspective, she had no  syncope or palpitations.  No falls, no chest pain, no PND, orthopnea.  She has had trace edema which seems to be helped by her Lasix dose.  She  continues to get around well.  I had a nice talk with [name] today,  who is a personal friend from the daycare.   MEDICATIONS:  1. Coreg 25 b.i.d.  2. Coumadin as directed.  3. Doxazosin 8 at bedtime.  4. Lasix 40 b.i.d.  5. Simvastatin 40 a day.  6. Spironolactone 25 a day.  7. Aspirin a day.  8. Zoloft 20 a day.  9. Diovan 160 a day.   PHYSICAL EXAMINATION:  VITAL SIGNS:  Remarkable for being in AFib at a  rate of 90, blood pressure is 140/80, afebrile, respiratory rate 14.  HEENT:  Unremarkable.  NECK:  Carotids are normal without bruit.  No lymphadenopathy,  thyromegaly or JVP elevation.  LUNGS:  Clear with good diaphragmatic motion.  No wheezing.  S1-S2 with  systolic murmur.  PMI normal.  ABDOMEN:  Benign.  Bowel sounds are positive.  No AAA.  No tenderness,  no bruit, no hepatosplenomegaly or hepatojugular reflux.  EXTREMITIES:  Distal pulses were intact with trace edema.  NEURO:  Nonfocal.  SKIN:  Warm and dry.  No muscular weakness.   IMPRESSION:  1. Atrial fibrillation, good rate control with Coreg.   Continue      anticoagulation.  Follow up in the Coumadin Clinic.  2. History of diastolic heart failure, currently stable.  No      significant shortness of breath.  3. Lower extremity edema.  Continue Lasix 40 b.i.d., low-salt diet.      She apparently was recently sent to the ER for potassium of 7.5.      These were hemolyzed.  She has no renal failure.  She will continue      her spironolactone.  There was no evidence of significant      hyperkalemia. Repeat labs.  4. Hypertension, currently well controlled.  Continue Diovan and low-      salt diet.   Overall, Sadiya is doing well considering her advanced age.  She will  follow up with her primary care doctor.  I will see her back  in 6  months.     [name]. Eden Emms, MD, [facility]  Electronically Signed    PCN/MedQ  DD: 04/27/2007  DT: 04/28/2007  Job #: [id number]

## 2010-06-29 NOTE — Assessment & Plan Note (Signed)
[facility] HEALTHCARE                            CARDIOLOGY OFFICE NOTE   Sara Carlson, Sara Carlson                      MRN:          [id number]  DATE:10/25/2007                            DOB:          [birth year]    Sara Carlson returns today for followup.  She has had some lower extremity  edema and history of atrial fibrillation, which is chronic.  She is  still a good anticoagulation candidate.  She has not had any bleeding  diathesis.  She gets around well with her walker.   She has not had any palpitations, PND, or orthopnea and no chest pain.  She was seen at Urgent Care recently for increasing lower extremity  edema, which was thought to be from gout.  She is on standard Lasix and  spironolactone.  She did have some dietary indiscretion with some nuts  in Mr. Sara Carlson.   She has otherwise been compliant with her med.  [name]  was with her today.  She is planning a trip to see Sara Carlson.    The patient is otherwise good quality of life.  She is living  independently.   MEDICATIONS:  1. Coreg 25 b.i.d.  2. Coumadin as directed.  3. Doxazosin 8 nightly.  4. Lasix 40 b.i.d.  5. Simvastatin 40 a day.  6. Spironolactone 25 a day.  7. Aspirin.  8. Fish oil.  9. Amiodarone 200 b.i.d.  10.Diovan.  11.Fluoxetine.   PHYSICAL EXAMINATION:  GENERAL:  Remarkable for an elderly black female  in no distress.  She is in AFib at a rate of 80.  VITAL SIGNS:  Blood pressure is 130/80 and weight is 149.  HEENT:  Unremarkable.  NECK:  Carotids are normal without bruit.  No lymphadenopathy,  thyromegaly, or JVP elevation.  LUNGS:  Clear.  Good diaphragmatic motion.  No wheezing.  CARDIAC:  S1 and S2.  Normal heart sounds.  PMI normal.  ABDOMEN:  Benign.  Bowel sounds positive.  No tenderness.  No bruit.  No  hepatosplenomegaly.  No hepatojugular reflux.  No tenderness.  EXTREMITIES:  Distal pulses are intact with trace edema.  She is a  bunion in the left  foot.  NEURO:  Nonfocal.  SKIN:  Warm and dry.  No muscular weakness.   IMPRESSION:  1. Atrial fibrillation, good rate control.  2. Anticoagulation, still a Coumadin candidate.  Follow up in the      clinic.  Last INR somewhat subtherapeutic 1.7.  3. Hypertension.  Currently, well controlled.  Continue current      medications.  Tighten up salt restriction in her diet.  4. Hypercholesterolemia.  Continue simvastatin.  Lipid and liver      profile in 6 months.  5. Although the patient's amiodarone is helping with the rate control.      She is in chronic Atrial fibrillation and I suspect I will stop it      on her next visit due to long term toxicity.     Sara Carlson. Eden Emms, MD, [facility]  Electronically Signed    PCN/MedQ  DD: 10/25/2007  DT:  10/26/2007  Job #: [id number]

## 2010-06-29 NOTE — Assessment & Plan Note (Signed)
[facility] HEALTHCARE                            CARDIOLOGY OFFICE NOTE   Sara Carlson, Sara Carlson                        MRN:          [id number]  DATE:01/15/2007                            DOB:          06-10-1922    Sara Carlson returns today for followup.  She is the mother of one of my long-  term friends, Sara Carlson, from the day care, who used to help take care of  our kids at [employer].  She has a history of nonischemic  cardiomyopathy and atrial arrhythmias, with chronic dizziness.  She has  had palpitations.   She has never had any high-grade heart block.  She does not have  documented coronary disease.  She is on chronic Coumadin for atrial  arrhythmias.   Currently, she is steady on her feet, and I do not think there is a  contraindication to it.   In talking to her, her biggest complaint, appears to be dizziness.  Interestingly, her last echocardiogram done in July 2008 showed normal  LV function.  However, she had an estimated PA systolic pressure of 87,  which would indicate severe pulmonary hypertension.  I talked to her and  her daughter at length about this.  Since her blood pressure is  suboptimally controlled, I thought it may be worthwhile to add a  pulmonary vasodilator or prostaglandin inhibitor to her.  I will look  through the dosages and see about starting her on Revatio.   I explained the diagnosis of pulmonary hypertension to them.  Sara Carlson  does not really act like a bad pulmonary hypertensive.  She has not had  a lot of lower extremity edema.  She has not had much dyspnea, except  that consistent with her age.  Her dizziness may be related, but this  has been fairly chronic, and it sounds more like vertigo or a CNS  problem.   REVIEW OF SYSTEMS:  Negative.   CURRENT MEDICATIONS:  1. Coreg 25 b.i.d.  2. Coumadin as directed.  3. Doxazosin 8 at bedtime.  4. Lasix 40 b.i.d.  5. Simvastatin 40 a day.  6. Spironolactone 25 a day.  7.  An aspirin a day.  8. Amiodarone 200 b.i.d.  9. Diovan.   PHYSICAL EXAMINATION:  GENERAL:  An elderly black female in no distress.  Affect is appropriate.  VITAL SIGNS:  Blood pressure is 179/63, pulse 51 and regular, weight  144, afebrile.  HEENT:  Unremarkable.  NECK:  JVP is elevated, with a V-wave.  There is no bruit, no  thyromegaly, no lymphadenopathy.  LUNGS:  Clear.  Good diaphragmatic motion.  No wheezing.  CARDIOVASCULAR:  There is an S1, S2.  I cannot feel an RV heave.  There  is a soft systolic murmur.  PMI is not palpable.  ABDOMEN:  Benign.  Bowel sounds positive.  No hepatosplenomegaly.  No  hepatojugular reflux.  No tenderness.  No bruit.  EXTREMITIES:  Distal pulses are intact.  No edema.   She has had paroxysmal atrial fibrillation.  She appears to be in sinus  rhythm at this time, but  has been on chronic Coumadin for this.   IMPRESSION:  1. Dizziness, likely central nervous system related.  Cannot rule out      chronic embolic events, but the patient has no focal signs.  If it      worsens, we can certainly do a CT or MRI.  Observe for now.  2. Paroxysmal atrial fibrillation.  Continue beta blocker and      Coumadin.  Follow up in the Coumadin Clinic.  No evidence of      palpitations or syncope.  3. Question pulmonary hypertension by echocardiogram.  I will have to      review this.  We will call her in Revatio as a pulmonary      vasodilator and see if this helps in regard to her dizziness, and      she certainly has room to go on her blood pressure.  4. Previous lower extremity edema, currently resolved on current dose      of Lasix and spironolactone.  5. Hypertension.  Currently suboptimally controlled.  Pulmonary      vasodilator may help.  Continue current dose of Diovan.  6. Hypercholesterolemia.  No documented coronary artery disease in the      past.  Continue simvastatin 40 a day.  Lipid and liver profile in 6      months.   [name]  will call me if there are any issues with the  Revatio.   They use the [facility] on [address].     Sara Carlson. Eden Emms, MD, [facility]  Electronically Signed    PCN/MedQ  DD: 01/15/2007  DT: 01/15/2007  Job #: [id number]

## 2010-07-02 NOTE — Assessment & Plan Note (Signed)
[place] HEALTHCARE                         ELECTROPHYSIOLOGY OFFICE NOTE   Sara Carlson, Sara Carlson                        MRN:          [id number]  DATE:04/10/2006                            DOB:          [birth year]    REFERRING PHYSICIAN:  [name]. Eden Emms, MD, [facility]   Sara Carlson is referred today by Dr. Charlton Haws for evaluation of  recurrent atrial arrhythmias and non-ischemic cardiomyopathy.   HISTORY OF PRESENT ILLNESS:  The patient is a very pleasant 77 year old  woman who recently moved from [place], [place] to [place], [place].  The patient has a long-standing history of non-ischemic  cardiomyopathy with LV functions typically in the 35-45% range.  She  also carries with her a remote history of congestive heart failure,  although her present systems are class I to II.  She denies  palpitations, and she has had no syncope.   MEDICATIONS:  1. Coreg 25 twice a day.  2. Coumadin.  3. Diovan 320 daily.  4. Doxazosin 8 daily.  5. Furosemide 40 twice a day.  6. Simvastatin 40 a day.  7. Aldactone 25 daily.  8. Aspirin 81 daily.  9. Amiodarone 200 mg daily.  (Of note, the Coumadin and the amiodarone were started by Dr. Eden Emms  several days ago.)   The patient denies any history of syncope.  She denies peripheral edema.  She states that her heart failure symptoms have been well controlled.   ADDITIONAL PAST MEDICAL HISTORY:  1. Dyslipidemia.  2. Hypertension.  3. History of partial hysterectomy in the past.  4. She has very mild chronic renal insufficiency.   FAMILY HISTORY:  Noncontributory at her advanced age.   SOCIAL HISTORY:  The patient lives with her daughter here in [place].  She denies tobacco or ethanol abuse.   REVIEW OF SYSTEMS:  As noted in the HPI; otherwise, all systems were  reviewed and found to be negative.   PHYSICAL EXAMINATION:  GENERAL:  She is a pleasant, elderly-appearing  woman who is in no acute  distress.  VITAL SIGNS:  The blood pressure was 132/66, pulse 60 and regular,  respirations 18, weight was 153 pounds.  HEENT:  Normocephalic and atraumatic.  Pupils were equal and round.  The  oropharynx was moist.  The sclerae were anicteric.  NECK:  No jugular venous distention.  There was no thyromegaly.  The  carotids were 2+ and symmetric.  The trachea was midline.  The lungs  were clear bilaterally to auscultation.  There were no wheezing, rales,  or rhonchi.  There was no increased work of breathing.  CARDIOVASCULAR:  Regular rate and rhythm with normal S1 and S2.  There  were no rubs or gallops.  There was a grade 1/6 systolic murmur at the  left lower sternal border.  ABDOMEN:  Soft, nontender, nondistended.  There was no organomegaly.  The bowel sounds were present.  There was no rebound or guarding.  EXTREMITIES:  No clubbing, cyanosis, or edema.  The pulses were 2+ and  symmetric.  NEUROLOGIC:  Alert and oriented x3 with cranial nerves intact.  Strength  was 5/5 and symmetric.   Prior EKGs demonstrate what appear to be an atrial tachycardia versus  atrial fibrillation with a controlled ventricular response.   IMPRESSION:  1. Nonischemic cardiomyopathy with an ejection fraction in the past      between 35 and 45%, now with class I to II heart failure.  2. Hypertension.  3. Multiple atrial arrhythmias.  4. New onset Coumadin therapy.  5. New onset amiodarone therapy.  6. Severe hypertension, well controlled now.   DISCUSSION:  Sara Carlson is very stable today.  She has very little in  the way of symptoms from her atrial arrhythmias.  Her heart failure is  also very nicely controlled.  With regard to the amiodarone, I think it  is reasonable to continue this medication at present.  She is at risk  for developing a symptomatic bradycardia, and will have to watch this  very carefully.  Within a month or two of initiating amiodarone, I would  have her wear a cardiac  monitor, at least for 24 hours to make sure that  she is not severely bradycardic.  Also, would repeat her echo in 2-3  months or consider a MUGA scan to evaluate her LV function.  She  apparently does not have an indication for a prophylactic ICD based on  her heart failure and her LV function which are class I to II with an EF  of approximately 40-45%.  I will plan to see her back on an as-needed  basis, and certainly would like to see her back if she develops symptoms  from her atrial  fibrillation, specifically bradycardia, or if she has evidence of severe  left ventricular dysfunction by her echo.     Doylene Canning. [name], MD  Electronically Signed    GWT/MedQ  DD: 04/10/2006  DT: 04/10/2006  Job #: [id number]   cc:   Willow Ora, MD  [name]. Eden Emms, MD, [facility]

## 2010-07-02 NOTE — Assessment & Plan Note (Signed)
[facility] HEALTHCARE                        [place] OFFICE NOTE   Sara Carlson, Sara Carlson                        MRN:          [id number]  DATE:03/30/2006                            DOB:          1922/05/01    CHIEF COMPLAINT:  Here to establish.   HISTORY OF PRESENT ILLNESS:  Sara Carlson is an 77 year old black female  who recently moved from [place] to the [place] area to live with her  daughter. She came here today because she needs a new primary care  doctor. She also complains of a cold for the last two or three days. She  describes runny nose, head congestion for which she has been taking  Claritin D. She has run out of her medications for at least 36 hours.   PAST MEDICAL HISTORY:  1. Has a diagnosis of cardiomyopathy and congestive heart failure.  2. Chronic renal insufficiency.  3. High cholesterol.  4. Hypertension.  5. Partial hysterectomy.  6. No history of breast or colon cancer.   FAMILY HISTORY:  Mother is deceased. She passed away at age 52 with  heart trouble. Father is also deceased.   SOCIAL HISTORY:  Does not smoke or drink. Again, she recently moved to  this area to live with her daughter.   REVIEW OF SYSTEMS:  Denies any fever, chest pain, or shortness of  breath. She did have some cough at the beginning of the cold, but now  that is better. No peripheral edema. No nausea or diarrhea.   CURRENT MEDICATIONS:  1. Spironolactone 25 one p.o. daily.  2. Diovan 320 one p.o. daily.  3. Aspirin 81 one p.o. daily.  4. Lasix 40 one p.o. b.i.d.  5. Doxazosin 8 mg one p.o. daily.  6. Carvedilol 25 mg one p.o. b.i.d.  7. Simvastatin 40 one p.o. q nightly.  8. She used to be on citalopram 10 mg for a couple of months, but she      quit a few days ago. She does not feel that she needs to go back to      that.   ALLERGIES:  No known drug allergies.   PHYSICAL EXAMINATION:  The patient is alert, oriented and in no apparent  distress. She is 5 feet, 1 inch tall. She weighs 152.2 pounds. Afebrile.  Pulse in the 130-140s. Blood pressure 180/90.  NECK: JVD is around 40 cm at 45-degrees.  LUNGS:  Clear to auscultation bilaterally.  CARDIOVASCULAR: Tachycardic, but I hear no murmur today.  EXTREMITIES: No edema.  ABDOMEN: Is not distended, soft, good bowel sounds. No organomegaly.  HEENT: Ears are normal. Oropharynx is normal. Nose is slightly  congested.  NEUROLOGIC: On examination, speech, gait, motor are intact. She is  alert, oriented.   LABORATORY DATA:  Laboratory and x-rays-I reviewed blood work from April  2007 and at that time her creatinine was 1.3. LFTs were normal and  hemoglobin 11.4. Total cholesterol was 154. Also, I have September 2007  labs that show creatinine of 1.4 with a potassium of 3.7. EKG today  shows tachycardia to the level of 142.  ASSESSMENT/PLAN:  1. The patient presents today to get established and get her refills.  2. The patient has tachycardia. I discussed this EKG with Dr. [name].      He feels that most likely this is atrial tachycardia. This could be      potentially from the decongestant use, the lack of her beta-      blockers in the last day and a half. Again, she is asymptomatic      from the heart standpoint. Based on this information, we feel that      she could go home, avoid decongestants and restart her medications      and be evaluated in a few days. This was discussed with the      patient. She is also asked to call me immediately or go to the      emergency room should she develop chest pain, shortness of breath      or palpitations.  3. Congestive heart failure. All her medications were refilled.      Hypertension and blood pressure is slightly elevated today, but      this will be rechecked next week once she is back on her medicines.  4. High cholesterol. She got a refill.  5. History of chronic renal insufficiency. Last creatinine per chart      review  is 1.4, will recheck it next week.  6. She will be referred to cardiology.  7. She will come back here in four days.     Sara Ora, MD  Electronically Signed    JP/MedQ  DD: 03/30/2006  DT: 03/30/2006  Job #: [id number]

## 2010-07-02 NOTE — Assessment & Plan Note (Signed)
[facility]                            CARDIOLOGY OFFICE NOTE   Sara Carlson, Sara Carlson                        MRN:          [id number]  DATE:04/06/2006                            DOB:          [birth year]    Sara Carlson is an 77 year old patient referred by Dr. Drue Novel.  She has a  history of congestive heart failure, and is having atrial arrhythmias.  Sara Carlson is actually the mother of a good friend of ours, Sara Carlson, who  runs the daycare at [employer], who helped take care of my children  when they were younger.   Her mother just moved up here from [place].  She is still fairly  independent and active.   I read through approximately 30 pages of notes from [facility] in [place], [place].   The patient has been followed for cardiomyopathy.  Her EF has been in  the 35% range.   There was an echo report from April 12, 2005, which showed an EF of  41% with mild left atrial enlargement, and no significant AS, and very  mild MR.  It is not clear to me from looking at the notes if the patient  has ever had a heart cath.  When I asked Sara Carlson about this, she said  that she had not.  She has had Cardiolites with no significant ischemia.  In talking to Sara Carlson, the diagnosis of cardiomyopathy has dated back  to at least 2004.  There are EKGs from 2002 which showed the patient  with a normal atrial focus and a heart rate in the 80s.  When the  patient saw Dr. Drue Novel, she was asymptomatic, but appeared to be in an  atrial tachycardia.  Her EKG today does show what appears to be an  atrial tachycardia with Wenckebach.  She has poor R wave progression,  but no previous MIs on her EKG.   The patient has been compliant with her medicines.  She was started on  Coumadin when she last saw Dr. Drue Novel.   From a cardiac perspective, she has not had any PND, orthopnea, syncope,  palpitations or lower extremity edema.   She is currently living with Sara Carlson,  her daughter.   PAST MEDICAL HISTORY:  Remarkable for:  1. Cardiomyopathy and heart failure.  2. Mild chronic renal insufficiency with creatinines of 1.2 to 1.3 in      her old records from [place].  3. Hypercholesterolemia.  4. Hypertension.  5. Previous partial hysterectomy.   FAMILY HISTORY:  Remarkable for her mother dying of heart trouble at age  26.  Father died, but of noncardiac causes.  She does not smoke or  drink.  She is living with her daughter.  Enjoys being outside and doing  yard work.   REVIEW OF SYSTEMS:  Negative for TIA, CVA, bleeding diaphysis, or any  significant recent hospitalization for heart failure.   MEDICATIONS:  Include:  1. Spironolactone 25 a day.  2. Diovan 320 a day.  3. Baby aspirin a day.  4. Lasix 40 b.i.d.  5. Doxazosin 8 mg a day.  6. Carvedilol 25 b.i.d.  7. Simvastatin 40 a day.   SHE HAS NO KNOWN DRUG ALLERGIES.   EXAMINATION:  HEENT:  Normal.  SKIN:  Warm and dry.  NEUROLOGIC:  Nonfocal.  Carotids are normal without bruit.  There is no lymphadenopathy.  LUNGS:  Clear.  There is an S1 and S2.  PMI is mildly increased.  There is no  significant murmurs.  ABDOMEN:  Benign.  She is status post hysterectomy.  Distal pulses are intact with no edema.  EKG was as indicated.  It appears to me to show an atrial tachycardia  with Wenckebach.   IMPRESSION:  I will refer Sara Carlson to Dr. Lewayne Bunting.  I believe he has  reviewed these strips from Dr. Drue Novel previously.  Given the patient's  history of cardiomyopathy, I think it would be important to ablate an  atrial tachycardia if possible.  The patient has been started on  Coumadin.  I am not sure that she needs to be on this in regards to an  atrial tachycardia with Wenckebach.  I suspect she was started on this  for fear that she was in atrial fibrillation.   She is already on a fairly good dose of beta blocker, 25 b.i.d. of  Coreg, and her heart rate remains 97.  I think for the time  being, we  will place her on 200 of amiodarone b.i.d.  Dr. Ladona Carlson may change this.  Given the interaction with Coumadin, I will lower her Coumadin to 5  alternating with 2.5.  It is more convenient for the patient to have her  Coumadin level checked here in our office.  We will have her check it  next week when she sees Dr. Ladona Carlson here.  I think that there is really  no clinical history of coronary disease, and we can workup the  structural aspects of her heart after her arrhythmia is taken care of.  Indeed, her heart function may improve once she is less tachycardic.   I have gone over these issues with Sara Carlson and her mom, and they seem  to understand.  I also advised to go along with any ET studies that Dr.  Ladona Carlson may want to pursue.   Unfortunately, despite her atrial tachycardia, she appears to be in  functional class 1 to 2, and is not having any active congestive heart  failure.   In regards to her blood pressure, we will continue her current  medications.  Her blood pressure in the office today was 150/80.  Her  pulse was 97 with irregularity.   In regards to her hypercholesterolemia, she will continue her  simvastatin.   I will see her back after Dr. Ladona Carlson reviews her rhythm and sees if she  is a candidate for invasive ET study.  I will also be curious to see  what he wants to do with her amiodarone.     Sara Pick. Eden Emms, MD, [facility]  Electronically Signed    PCN/MedQ  DD: 04/06/2006  DT: 04/06/2006  Job #: [id number]

## 2010-07-15 ENCOUNTER — Ambulatory Visit (INDEPENDENT_AMBULATORY_CARE_PROVIDER_SITE_OTHER): Payer: PRIVATE HEALTH INSURANCE | Admitting: *Deleted

## 2010-07-15 DIAGNOSIS — I4891 Unspecified atrial fibrillation: Secondary | ICD-10-CM

## 2010-07-15 LAB — POCT INR: INR: 2.4

## 2010-07-16 ENCOUNTER — Other Ambulatory Visit: Payer: Self-pay | Admitting: Internal Medicine

## 2010-07-16 NOTE — Telephone Encounter (Signed)
Pt is due for CPX- please schedule

## 2010-07-22 NOTE — Telephone Encounter (Signed)
Set up CPX for Monday 6/18 at 10:00---patient will contact daughter to make sure date is good---daughter will resch if necessary

## 2010-08-02 ENCOUNTER — Encounter: Payer: PRIVATE HEALTH INSURANCE | Admitting: Internal Medicine

## 2010-08-17 ENCOUNTER — Ambulatory Visit (INDEPENDENT_AMBULATORY_CARE_PROVIDER_SITE_OTHER): Payer: PRIVATE HEALTH INSURANCE | Admitting: *Deleted

## 2010-08-17 DIAGNOSIS — I4891 Unspecified atrial fibrillation: Secondary | ICD-10-CM

## 2010-08-20 ENCOUNTER — Encounter: Payer: Self-pay | Admitting: Internal Medicine

## 2010-08-20 ENCOUNTER — Other Ambulatory Visit: Payer: Self-pay | Admitting: Internal Medicine

## 2010-08-20 DIAGNOSIS — E785 Hyperlipidemia, unspecified: Secondary | ICD-10-CM

## 2010-08-20 DIAGNOSIS — E559 Vitamin D deficiency, unspecified: Secondary | ICD-10-CM

## 2010-08-20 DIAGNOSIS — I10 Essential (primary) hypertension: Secondary | ICD-10-CM

## 2010-08-20 DIAGNOSIS — I4891 Unspecified atrial fibrillation: Secondary | ICD-10-CM

## 2010-08-23 ENCOUNTER — Encounter: Payer: Self-pay | Admitting: Internal Medicine

## 2010-08-23 ENCOUNTER — Other Ambulatory Visit (INDEPENDENT_AMBULATORY_CARE_PROVIDER_SITE_OTHER): Payer: PRIVATE HEALTH INSURANCE

## 2010-08-23 ENCOUNTER — Ambulatory Visit (INDEPENDENT_AMBULATORY_CARE_PROVIDER_SITE_OTHER): Payer: PRIVATE HEALTH INSURANCE | Admitting: Internal Medicine

## 2010-08-23 VITALS — BP 138/80 | HR 88 | Wt 169.8 lb

## 2010-08-23 DIAGNOSIS — I10 Essential (primary) hypertension: Secondary | ICD-10-CM

## 2010-08-23 DIAGNOSIS — E785 Hyperlipidemia, unspecified: Secondary | ICD-10-CM

## 2010-08-23 DIAGNOSIS — F0390 Unspecified dementia without behavioral disturbance: Secondary | ICD-10-CM

## 2010-08-23 DIAGNOSIS — I509 Heart failure, unspecified: Secondary | ICD-10-CM

## 2010-08-23 DIAGNOSIS — E559 Vitamin D deficiency, unspecified: Secondary | ICD-10-CM

## 2010-08-23 DIAGNOSIS — I4891 Unspecified atrial fibrillation: Secondary | ICD-10-CM

## 2010-08-23 LAB — CBC WITH DIFFERENTIAL/PLATELET
Basophils Relative: 0.4 % (ref 0.0–3.0)
Eosinophils Relative: 1 % (ref 0.0–5.0)
HCT: 32.4 % — ABNORMAL LOW (ref 36.0–46.0)
Hemoglobin: 11 g/dL — ABNORMAL LOW (ref 12.0–15.0)
Lymphs Abs: 1.2 10*3/uL (ref 0.7–4.0)
MCV: 94.1 fl (ref 78.0–100.0)
Monocytes Absolute: 0.6 10*3/uL (ref 0.1–1.0)
Monocytes Relative: 12.8 % — ABNORMAL HIGH (ref 3.0–12.0)
Neutro Abs: 2.6 10*3/uL (ref 1.4–7.7)
Platelets: 190 10*3/uL (ref 150.0–400.0)
WBC: 4.4 10*3/uL — ABNORMAL LOW (ref 4.5–10.5)

## 2010-08-23 LAB — LIPID PANEL
Cholesterol: 156 mg/dL (ref 0–200)
HDL: 63.8 mg/dL (ref >39.00)
LDL Cholesterol: 82 mg/dL (ref 0–99)
Triglycerides: 50 mg/dL (ref 0.0–149.0)
VLDL: 10 mg/dL (ref 0.0–40.0)

## 2010-08-23 LAB — BASIC METABOLIC PANEL
CO2: 24 mEq/L (ref 19–32)
Calcium: 9.2 mg/dL (ref 8.4–10.5)
Creatinine, Ser: 1.6 mg/dL — ABNORMAL HIGH (ref 0.4–1.2)
Glucose, Bld: 131 mg/dL — ABNORMAL HIGH (ref 70–99)

## 2010-08-23 MED ORDER — SPIRONOLACTONE 25 MG PO TABS: 25.0000 mg | ORAL_TABLET | Freq: Every day | ORAL | Status: DC

## 2010-08-23 MED ORDER — MEMANTINE HCL 5 MG PO TABS: 5.0000 mg | ORAL_TABLET | Freq: Two times a day (BID) | ORAL | Status: DC

## 2010-08-23 MED ORDER — VALSARTAN 160 MG PO TABS: 160.0000 mg | ORAL_TABLET | Freq: Every day | ORAL | Status: DC

## 2010-08-23 MED ORDER — DOXAZOSIN MESYLATE 8 MG PO TABS: 8.0000 mg | ORAL_TABLET | Freq: Every day | ORAL | Status: DC

## 2010-08-23 NOTE — Assessment & Plan Note (Signed)
Well controlled 

## 2010-08-23 NOTE — Assessment & Plan Note (Signed)
On no meds, labs 

## 2010-08-23 NOTE — Progress Notes (Signed)
Labs only

## 2010-08-23 NOTE — Progress Notes (Signed)
  Subjective:    Patient ID: Sara Carlson, female    DOB: 06-11-1922, 77 y.o.   MRN: [id number]  HPI Routine office visit, here with her daughter, nothing new to report.  Past Medical History  Diagnosis Date  . CHF (congestive heart failure)   . Atrial fibrillation     paroxismal  . Hyperlipidemia   . Hypertension   . Anxiety and depression   . Dementia   . Allergic rhinitis     Past Surgical History  Procedure Date  . Abdominal hysterectomy      Social History: Moved from [place] to [place] Feb. 2008 lives in her appartment "independent living" ADLs: independent, see HPI doesn't drive Daughter [name] is a friend of Dr. Eden Emms and works at [employer] day care. no tobacco ETOH--  wine sometimes    Review of Systems Good medication compliance with all meds. Her hand's skin is getting darker and "rough" the patient's convenience is due to Coumadin. Mental status is at baseline Denies abdominal pain, nausea vomiting or diarrhea. No blood in the stools or blood in the urine.     Objective:   Physical Exam Alert, pleasant, cooperative, no apparent distress. Lungs clear to auscultation bilaterally Cardiovascular: She has a systolic murmur, rate is irregularly irregular. Extremities: No edema Mental status: Alert and oriented to self, day of the  week, place. Did not recall what year is it.       Assessment & Plan:

## 2010-08-23 NOTE — Assessment & Plan Note (Signed)
Stable, no change

## 2010-08-23 NOTE — Assessment & Plan Note (Signed)
No evidence of volume overload on clinical grounds

## 2010-08-23 NOTE — Assessment & Plan Note (Signed)
On coumadin per cards, pt thinks has skin s/e , told pt that is unlikely, encouraged to discuss w/  Cards

## 2010-08-24 LAB — VITAMIN D 25 HYDROXY (VIT D DEFICIENCY, FRACTURES): Vit D, 25-Hydroxy: 34 ng/mL (ref 30–89)

## 2010-08-26 ENCOUNTER — Telehealth: Payer: Self-pay | Admitting: *Deleted

## 2010-08-26 NOTE — Telephone Encounter (Signed)
Message copied by Leanne Lovely on Thu Aug 26, 2010  3:00 PM ------      Message from: Willow Ora E      Created: Thu Aug 26, 2010  7:55 AM       Please call the patient's daughter [name]:      Cholesterol, vitamin D, kidney function, mild anemia all stable.       Good results, continue same meds

## 2010-08-26 NOTE — Telephone Encounter (Signed)
Pts daugther is aware .

## 2010-09-14 ENCOUNTER — Ambulatory Visit (INDEPENDENT_AMBULATORY_CARE_PROVIDER_SITE_OTHER): Payer: PRIVATE HEALTH INSURANCE | Admitting: Cardiovascular Disease

## 2010-09-14 ENCOUNTER — Ambulatory Visit (INDEPENDENT_AMBULATORY_CARE_PROVIDER_SITE_OTHER): Payer: PRIVATE HEALTH INSURANCE | Admitting: *Deleted

## 2010-09-14 ENCOUNTER — Encounter: Payer: Self-pay | Admitting: Cardiovascular Disease

## 2010-09-14 DIAGNOSIS — I4891 Unspecified atrial fibrillation: Secondary | ICD-10-CM

## 2010-09-14 DIAGNOSIS — I10 Essential (primary) hypertension: Secondary | ICD-10-CM

## 2010-09-14 DIAGNOSIS — F0390 Unspecified dementia without behavioral disturbance: Secondary | ICD-10-CM

## 2010-09-14 DIAGNOSIS — E785 Hyperlipidemia, unspecified: Secondary | ICD-10-CM

## 2010-09-14 DIAGNOSIS — Z7901 Long term (current) use of anticoagulants: Secondary | ICD-10-CM

## 2010-09-14 NOTE — Patient Instructions (Signed)
Your physician wants you to follow-up in: 6 MONTHS You will receive a reminder letter in the mail two months in advance. If you don't receive a letter, please call our office to schedule the follow-up appointment. 

## 2010-09-14 NOTE — Assessment & Plan Note (Signed)
Good rate control and anticoagulation.  Can increase amlodipine in future if needed for rate

## 2010-09-14 NOTE — Progress Notes (Signed)
Sara Carlson is seen today for f/U of afib, anticoagulation, MR and pulmonary hypertension. Despite her age she has been a reasonable coumadin candidate still living indep. and with no falls. Her INR's have bee Rx. She has had mild to moderate MR in the past but elevated PA pressures by echo disproportionately high at mid 80s. She has had lower extremity edema likely from pulmonary hypertension and has responded well to Lasix and aldactone. She is at assisted living ion [name] and [name] takes good care of her. She loves her Snickers candy but has to limit it because of gout. Despite the estimated PA pressure she has not acted like bad PHT with no significant SOB and given her age we have not been agressive about adding vasodilators  Complains of skin changes on coumadin   ROS: Denies fever, malais, weight loss, blurry vision, decreased visual acuity, cough, sputum, SOB, hemoptysis, pleuritic pain, palpitaitons, heartburn, abdominal pain, melena, lower extremity edema, claudication, or rash.  All other systems reviewed and negative  General: Affect appropriate Healthy:  appears stated age HEENT: normal Neck supple with no adenopathy JVP normal no bruits no thyromegaly Lungs clear with no wheezing and good diaphragmatic motion Heart:  S1/S2 no murmur,rub, gallop or click PMI normal Abdomen: benighn, BS positve, no tenderness, no AAA no bruit.  No HSM or HJR Distal pulses intact with no bruits Trace bilateral edema Neuro non-focal Skin warm and dry No muscular weakness   Current Outpatient Prescriptions  Medication Sig Dispense Refill  . CALCIUM-VITAMIN D PO Take by mouth.        . COUMADIN 2.5 MG tablet USE AS DIRECTED BY ANTICOAGULATION CLINIC.  50 tablet  3  . donepezil (ARICEPT) 10 MG tablet Take 10 mg by mouth at bedtime.        Marland Kitchen doxazosin (CARDURA) 8 MG tablet Take 1 tablet (8 mg total) by mouth at bedtime.  90 tablet  1  . felodipine (PLENDIL) 2.5 MG 24 hr tablet  Take 2.5 mg by mouth daily.        . furosemide (LASIX) 40 MG tablet TAKE 1 TABLET BY MOUTH TWICE A DAY  180 tablet  2  . loratadine (CLARITIN) 5 MG chewable tablet Chew 5 mg by mouth daily.        . memantine (NAMENDA) 5 MG tablet Take 1 tablet (5 mg total) by mouth 2 (two) times daily.  90 tablet  1  . Polyethyl Glycol-Propyl Glycol (SYSTANE) 0.4-0.3 % SOLN Apply to eye.        . spironolactone (ALDACTONE) 25 MG tablet Take 1 tablet (25 mg total) by mouth daily.  90 tablet  1  . valsartan (DIOVAN) 160 MG tablet Take 1 tablet (160 mg total) by mouth daily.  90 tablet  1    Allergies  Review of patient's allergies indicates no known allergies.  Electrocardiogram:  Afib 87  Poor R wave progression  Assessment and Plan

## 2010-09-14 NOTE — Assessment & Plan Note (Signed)
Cholesterol is at goal.  Continue current dose of statin and diet Rx.  No myalgias or side effects.  F/U  LFT's in 6 months. Lab Results  Component Value Date   LDLCALC 82 08/23/2010             

## 2010-09-14 NOTE — Assessment & Plan Note (Signed)
Well controlled.  Continue current medications and low sodium Dash type diet.    

## 2010-09-14 NOTE — Assessment & Plan Note (Signed)
Despite age and demetial still a candidate for coumadin F/U INR in clinic

## 2010-09-14 NOTE — Assessment & Plan Note (Signed)
Actually quite spry and good memory today.  Continue namenda and aricept

## 2010-09-28 ENCOUNTER — Ambulatory Visit (INDEPENDENT_AMBULATORY_CARE_PROVIDER_SITE_OTHER): Payer: PRIVATE HEALTH INSURANCE | Admitting: *Deleted

## 2010-09-28 DIAGNOSIS — I4891 Unspecified atrial fibrillation: Secondary | ICD-10-CM

## 2010-10-14 ENCOUNTER — Emergency Department (HOSPITAL_COMMUNITY)
Admission: EM | Admit: 2010-10-14 | Discharge: 2010-10-14 | Disposition: A | Discharge status: Home / Self Care | Payer: PRIVATE HEALTH INSURANCE | Attending: Emergency Medicine | Admitting: Emergency Medicine

## 2010-10-14 ENCOUNTER — Telehealth: Payer: Self-pay | Admitting: Internal Medicine

## 2010-10-14 ENCOUNTER — Ambulatory Visit: Payer: PRIVATE HEALTH INSURANCE | Admitting: Family Medicine

## 2010-10-14 ENCOUNTER — Emergency Department (HOSPITAL_COMMUNITY): Payer: PRIVATE HEALTH INSURANCE

## 2010-10-14 DIAGNOSIS — I10 Essential (primary) hypertension: Secondary | ICD-10-CM | POA: Insufficient documentation

## 2010-10-14 DIAGNOSIS — G44209 Tension-type headache, unspecified, not intractable: Secondary | ICD-10-CM | POA: Insufficient documentation

## 2010-10-14 DIAGNOSIS — R609 Edema, unspecified: Secondary | ICD-10-CM | POA: Insufficient documentation

## 2010-10-14 DIAGNOSIS — Z7901 Long term (current) use of anticoagulants: Secondary | ICD-10-CM | POA: Insufficient documentation

## 2010-10-14 DIAGNOSIS — R5381 Other malaise: Secondary | ICD-10-CM | POA: Insufficient documentation

## 2010-10-14 DIAGNOSIS — M109 Gout, unspecified: Secondary | ICD-10-CM | POA: Insufficient documentation

## 2010-10-14 DIAGNOSIS — R42 Dizziness and giddiness: Secondary | ICD-10-CM | POA: Insufficient documentation

## 2010-10-14 DIAGNOSIS — R112 Nausea with vomiting, unspecified: Secondary | ICD-10-CM | POA: Insufficient documentation

## 2010-10-14 DIAGNOSIS — I4891 Unspecified atrial fibrillation: Secondary | ICD-10-CM | POA: Insufficient documentation

## 2010-10-14 LAB — POCT I-STAT, CHEM 8
BUN: 24 mg/dL — ABNORMAL HIGH (ref 6–23)
Calcium, Ion: 1.21 mmol/L (ref 1.12–1.32)
Chloride: 106 mEq/L (ref 96–112)
Creatinine, Ser: 1.6 mg/dL — ABNORMAL HIGH (ref 0.50–1.10)
Glucose, Bld: 115 mg/dL — ABNORMAL HIGH (ref 70–99)
HCT: 41 % (ref 36.0–46.0)
Hemoglobin: 13.9 g/dL (ref 12.0–15.0)
Potassium: 3.5 mEq/L (ref 3.5–5.1)
Sodium: 142 mEq/L (ref 135–145)
TCO2: 24 mmol/L (ref 0–100)

## 2010-10-14 LAB — SEDIMENTATION RATE: Sed Rate: 22 mm/hr (ref 0–22)

## 2010-10-14 LAB — PROTIME-INR
INR: 1.65 — ABNORMAL HIGH (ref 0.00–1.49)
Prothrombin Time: 19.8 s — ABNORMAL HIGH (ref 11.6–15.2)

## 2010-10-14 LAB — APTT: aPTT: 35 s (ref 24–37)

## 2010-10-14 NOTE — Telephone Encounter (Signed)
I called and spoke with Deidre Ala, the patient's daughter, and told her that the Physicians decided to send her mom to the ER and she was O.K. With that.

## 2010-10-15 ENCOUNTER — Encounter: Payer: PRIVATE HEALTH INSURANCE | Admitting: *Deleted

## 2010-10-19 ENCOUNTER — Ambulatory Visit (INDEPENDENT_AMBULATORY_CARE_PROVIDER_SITE_OTHER): Payer: PRIVATE HEALTH INSURANCE | Admitting: *Deleted

## 2010-10-19 DIAGNOSIS — I4891 Unspecified atrial fibrillation: Secondary | ICD-10-CM

## 2010-10-19 LAB — POCT INR: INR: 1.6

## 2010-11-02 ENCOUNTER — Encounter: Payer: PRIVATE HEALTH INSURANCE | Admitting: *Deleted

## 2010-11-05 ENCOUNTER — Ambulatory Visit (INDEPENDENT_AMBULATORY_CARE_PROVIDER_SITE_OTHER): Payer: PRIVATE HEALTH INSURANCE | Admitting: *Deleted

## 2010-11-05 DIAGNOSIS — I4891 Unspecified atrial fibrillation: Secondary | ICD-10-CM

## 2010-11-05 LAB — I-STAT 8, (EC8 V) (CONVERTED LAB)
Acid-Base Excess: 3 — ABNORMAL HIGH
Bicarbonate: 27.2 — ABNORMAL HIGH
HCT: 37
Hemoglobin: 12.6
Operator id: 239701
Potassium: 3.1 — ABNORMAL LOW
Sodium: 143
TCO2: 28

## 2010-11-12 LAB — POCT I-STAT, CHEM 8
BUN: 31 — ABNORMAL HIGH
Calcium, Ion: 1.22
Creatinine, Ser: 1.7 — ABNORMAL HIGH
Glucose, Bld: 162 — ABNORMAL HIGH
TCO2: 26

## 2010-11-30 ENCOUNTER — Encounter: Payer: PRIVATE HEALTH INSURANCE | Admitting: *Deleted

## 2010-12-01 ENCOUNTER — Ambulatory Visit (INDEPENDENT_AMBULATORY_CARE_PROVIDER_SITE_OTHER): Payer: PRIVATE HEALTH INSURANCE | Admitting: *Deleted

## 2010-12-01 DIAGNOSIS — I4891 Unspecified atrial fibrillation: Secondary | ICD-10-CM

## 2010-12-29 ENCOUNTER — Ambulatory Visit (INDEPENDENT_AMBULATORY_CARE_PROVIDER_SITE_OTHER): Payer: PRIVATE HEALTH INSURANCE | Admitting: *Deleted

## 2010-12-29 DIAGNOSIS — I4891 Unspecified atrial fibrillation: Secondary | ICD-10-CM

## 2010-12-29 LAB — POCT INR: INR: 3.2

## 2010-12-30 ENCOUNTER — Telehealth: Payer: Self-pay

## 2010-12-30 NOTE — Telephone Encounter (Signed)
Pt's daughter called to say that pt has a flare up of gout in her rt great toe and would like to know if Dr. Drue Novel will send an Rx in to pharmacy

## 2010-12-31 NOTE — Telephone Encounter (Signed)
Left message to advise daughter of MD's note. Advised daughter call office to schedule office visit

## 2010-12-31 NOTE — Telephone Encounter (Signed)
I reviewed the chart, she has no past history of gout. I wonder if she has infection, needs to be seen.

## 2011-01-03 ENCOUNTER — Ambulatory Visit: Payer: PRIVATE HEALTH INSURANCE | Admitting: Internal Medicine

## 2011-01-26 ENCOUNTER — Ambulatory Visit (INDEPENDENT_AMBULATORY_CARE_PROVIDER_SITE_OTHER): Payer: PRIVATE HEALTH INSURANCE | Admitting: *Deleted

## 2011-01-26 DIAGNOSIS — I4891 Unspecified atrial fibrillation: Secondary | ICD-10-CM

## 2011-01-27 ENCOUNTER — Other Ambulatory Visit: Payer: Self-pay | Admitting: Cardiovascular Disease

## 2011-02-03 ENCOUNTER — Ambulatory Visit (INDEPENDENT_AMBULATORY_CARE_PROVIDER_SITE_OTHER): Payer: PRIVATE HEALTH INSURANCE | Admitting: Internal Medicine

## 2011-02-03 ENCOUNTER — Encounter: Payer: Self-pay | Admitting: Internal Medicine

## 2011-02-03 DIAGNOSIS — M109 Gout, unspecified: Secondary | ICD-10-CM

## 2011-02-03 DIAGNOSIS — Z23 Encounter for immunization: Secondary | ICD-10-CM

## 2011-02-03 MED ORDER — PREDNISONE 10 MG PO TABS: ORAL_TABLET | ORAL | Status: DC

## 2011-02-03 NOTE — Assessment & Plan Note (Addendum)
Presents w/ ankle swelling and pain, today daughter reports that 2 years ago went to a UC and was told she had gout. Sx do resemble gout, thus will treat as a flare up w/ steroids, due to elevated creatinine  She can't get NSAIDs If sx become recurrent-- allopurinol will be indicated Given CHF will keep pt on diuretics  Will also obtain a XR She has LE  Edema distally, calf itself is soft and not enlarged; nevertheless if the swelling does not resolve quickly, will obtain a u/s to r/o DVT Will obtain a Uric acid on RTC

## 2011-02-03 NOTE — Progress Notes (Signed)
  Subjective:    Patient ID: Sara Carlson, female    DOB: 30-Apr-1922, 77 y.o.   MRN: [id number]  HPI Here with her daughter, on and off pain and swelling of the right ankle for several days, more consistently so for the last 2 days. The area is not red , no injury. Has not taken anything but Tylenol Left ankle normal.   Past Medical History: Congestive heart failure A. Fib, paroxismal Hyperlipidemia Hypertension anxiety-depression dementia ALLERGIC RHINITIS   Gout   Past Surgical History: Reviewed history from 04/01/2008 and no changes required. Hysterectomy, no oophorectomy   Family History: Reviewed history from 09/15/2009 and no changes required. colon ca--no breast ca--no DM--no CAD--no  Social History: Moved from [place] to [place] Feb. 2008 lives in her appartment "independent living" ADLs: independent, see HPI doesn't drive Daughter [name] is a friend of Dr. Eden Emms and works at [employer] day care. no tobacco ETOH--  wine sometimes   Review of Systems No fever or chills No chest pain or shortness of breath   Today, the patient's daughter reports that she was seen at urgent care a couple of years ago and diagnosed with gout. No problems since.    Objective:   Physical Exam  Constitutional: She is oriented to person, place, and time. She appears well-developed and well-nourished.  Cardiovascular:       Good bilateral pedal pulses  Musculoskeletal:       Left leg and ankle normal. Right ankle slightly swollen, warm. Painful to passive movement. There is some swelling just above the ankle as well. The right calf is soft, nontender and symmetric compared to the left. right toes are normal, specifically the great toe without evidence of podagra.  Neurological: She is alert and oriented to person, place, and time.  Skin: Skin is warm and dry.       Skin both feet without evidence of cellulitis  Psychiatric: She has a normal mood and affect. Her behavior is normal.  Judgment and thought content normal.      Assessment & Plan:  Today , I spent more than 25  min with the patient, >50% of the time counseling about the plan of care

## 2011-02-03 NOTE — Patient Instructions (Signed)
Rest with the leg elevated, ice Tylenol for pain No motrin or motrin like med Prednisone as prescribed Call in 3-4 days if the swelling is not going down or if the swelling gets worse

## 2011-02-03 NOTE — Progress Notes (Deleted)
Patient ID: Sara Carlson, female   DOB: Aug 07, 1922, 77 y.o.   MRN: [id number]

## 2011-02-10 ENCOUNTER — Ambulatory Visit (INDEPENDENT_AMBULATORY_CARE_PROVIDER_SITE_OTHER): Payer: PRIVATE HEALTH INSURANCE | Admitting: *Deleted

## 2011-02-10 DIAGNOSIS — I4891 Unspecified atrial fibrillation: Secondary | ICD-10-CM

## 2011-02-14 ENCOUNTER — Encounter: Payer: PRIVATE HEALTH INSURANCE | Admitting: *Deleted

## 2011-02-21 ENCOUNTER — Other Ambulatory Visit: Payer: Self-pay | Admitting: Internal Medicine

## 2011-03-03 ENCOUNTER — Other Ambulatory Visit: Payer: Self-pay | Admitting: Internal Medicine

## 2011-03-03 ENCOUNTER — Encounter: Payer: PRIVATE HEALTH INSURANCE | Admitting: *Deleted

## 2011-03-07 NOTE — Telephone Encounter (Signed)
rx sent to pharmacy by e-script  

## 2011-03-10 ENCOUNTER — Encounter: Payer: Self-pay | Admitting: Cardiovascular Disease

## 2011-03-10 ENCOUNTER — Ambulatory Visit (INDEPENDENT_AMBULATORY_CARE_PROVIDER_SITE_OTHER): Payer: PRIVATE HEALTH INSURANCE | Admitting: Pharmacist

## 2011-03-10 ENCOUNTER — Ambulatory Visit (INDEPENDENT_AMBULATORY_CARE_PROVIDER_SITE_OTHER): Payer: PRIVATE HEALTH INSURANCE | Admitting: Cardiovascular Disease

## 2011-03-10 DIAGNOSIS — Z7901 Long term (current) use of anticoagulants: Secondary | ICD-10-CM

## 2011-03-10 DIAGNOSIS — I059 Rheumatic mitral valve disease, unspecified: Secondary | ICD-10-CM

## 2011-03-10 DIAGNOSIS — I4891 Unspecified atrial fibrillation: Secondary | ICD-10-CM

## 2011-03-10 DIAGNOSIS — F0390 Unspecified dementia without behavioral disturbance: Secondary | ICD-10-CM

## 2011-03-10 DIAGNOSIS — E785 Hyperlipidemia, unspecified: Secondary | ICD-10-CM

## 2011-03-10 DIAGNOSIS — I340 Nonrheumatic mitral (valve) insufficiency: Secondary | ICD-10-CM

## 2011-03-10 LAB — POCT INR: INR: 2

## 2011-03-10 MED ORDER — CARVEDILOL 6.25 MG PO TABS: 6.2500 mg | ORAL_TABLET | Freq: Two times a day (BID) | ORAL | Status: DC

## 2011-03-10 NOTE — Assessment & Plan Note (Signed)
Rate is up and BP  D/C plendil and start coreg 6.25 bid.  F/U 6-8 weeks

## 2011-03-10 NOTE — Progress Notes (Signed)
Sara Carlson is seen today for f/U of afib, anticoagulation, MR and pulmonary hypertension. Despite her age she has been a reasonable coumadin candidate still living indep. and with no falls. Her INR's have bee Rx. She has had mild to moderate MR in the past but elevated PA pressures by echo disproportionately high at mid 80s. She has had lower extremity edema likely from pulmonary hypertension and has responded well to Lasix and aldactone. She is at assisted living [name] and [name] takes good care of her. She loves her Snickers candy but has to limit it because of gout. Despite the estimated PA pressure she has not acted like bad PHT with no significant SOB and given her age we have not been agressive about adding vasodilators  Complains of skin changes on coumadin  ROS: Denies fever, malais, weight loss, blurry vision, decreased visual acuity, cough, sputum, SOB, hemoptysis, pleuritic pain, palpitaitons, heartburn, abdominal pain, melena, lower extremity edema, claudication, or rash.  All other systems reviewed and negative  General: Affect appropriate Healthy:  appears stated age HEENT: normal Neck supple with no adenopathy JVP normal no bruits no thyromegaly Lungs clear with no wheezing and good diaphragmatic motion Heart:  S1/S2 no murmur, no rub, gallop or click PMI normal Abdomen: benighn, BS positve, no tenderness, no AAA no bruit.  No HSM or HJR Distal pulses intact with no bruits No edema Neuro non-focal Skin warm and dry No muscular weakness   Current Outpatient Prescriptions  Medication Sig Dispense Refill  . CALCIUM-VITAMIN D PO Take by mouth.        . COUMADIN 2.5 MG tablet USE AS DIRECTED BY ANTICOAGULATION CLINIC.  50 tablet  3  . DIOVAN 160 MG tablet take 1 tablet by mouth once daily  90 tablet  1  . donepezil (ARICEPT) 10 MG tablet Take 10 mg by mouth at bedtime.        Marland Kitchen doxazosin (CARDURA) 8 MG tablet Take 1 tablet (8 mg total) by mouth at  bedtime.  90 tablet  1  . felodipine (PLENDIL) 2.5 MG 24 hr tablet Take 2.5 mg by mouth daily.        . furosemide (LASIX) 40 MG tablet TAKE 1 TABLET BY MOUTH TWICE A DAY  180 tablet  2  . loratadine (CLARITIN) 5 MG chewable tablet Chew 5 mg by mouth daily.        . memantine (NAMENDA) 5 MG tablet Take 1 tablet (5 mg total) by mouth 2 (two) times daily.  90 tablet  1  . Polyethyl Glycol-Propyl Glycol (SYSTANE) 0.4-0.3 % SOLN Apply to eye.        . predniSONE (DELTASONE) 10 MG tablet 4 tablets x 2 days, 3 tabs x 2 days, 2 tabs x 2 days, 1 tab x 2 days    20 tablet  0  . spironolactone (ALDACTONE) 25 MG tablet take 1 tablet by mouth once daily  90 tablet  1    Allergies  Review of patient's allergies indicates no known allergies.  Electrocardiogram:  Assessment and Plan

## 2011-03-10 NOTE — Assessment & Plan Note (Signed)
INR 2.0 today.  Continue coumadin for afib

## 2011-03-10 NOTE — Assessment & Plan Note (Signed)
Up today.  In general good.  Will adjust meds see afib

## 2011-03-10 NOTE — Assessment & Plan Note (Signed)
No change in murmur.  JVP elevated but no SOB  Continue current dose of lasix and aldactone

## 2011-03-10 NOTE — Assessment & Plan Note (Signed)
Cholesterol is at goal.  Continue current dose of statin and diet Rx.  No myalgias or side effects.  F/U  LFT's in 6 months. Lab Results  Component Value Date   LDLCALC 82 08/23/2010             

## 2011-03-10 NOTE — Patient Instructions (Signed)
Your physician wants you to follow-up in: 6-8 weeks. You will receive a reminder letter in the mail two months in advance. If you don't receive a letter, please call our office to schedule the follow-up appointment.  Stop Plendil (felodipine)  Start Coreg 6.25mg  twice daily.

## 2011-03-10 NOTE — Assessment & Plan Note (Signed)
Very coherant today.  Living at assisted living.  Daughter take good care of her

## 2011-03-23 ENCOUNTER — Telehealth: Payer: Self-pay | Admitting: *Deleted

## 2011-03-23 NOTE — Telephone Encounter (Signed)
[facility] Triage Call Report Triage Record Num: [id number] Operator: Albertine Grates Patient Name: Lucita Ferrara Call Date & Time: 03/23/2011 4:19:17PM Patient Phone: [phone] PCP: Nolon Rod. Paz Patient Gender: Female PCP Fax : Patient DOB: 1922/06/05 Practice Name: [facility] Reason for Call: Caller: Fransina/Child; PCP: Willow Ora; CB#: [phone]; ; ; Call regarding Episode of Gout Right Foot, Can Something Be Called In?; Daughter calling and states pt. was seen in office "about 1 month ago" and MD prescribed a medicine for gout. Foot has swollen again since 2-4. Skin is not red but is "tight". Is able to walk. Complains with soreness "on top of foot". Wants to know if medicine can be called in instead of pt. coming back to office. Please call and advise if can refill med. Uses [facility]. PLEASE CALL

## 2011-03-24 NOTE — Telephone Encounter (Signed)
Prednisone 10 MG tablet  4 tablets x 2 days, 3 tabs x 2 days, 2 tabs x 2 days, 1 tab x 2 days ,#20, no RF Also tell them she needs a office visit in one month, she may need to start taking a daily medication for gout

## 2011-03-25 MED ORDER — PREDNISONE 10 MG PO TABS: ORAL_TABLET | ORAL | Status: DC

## 2011-03-25 NOTE — Telephone Encounter (Signed)
Sent in prescription & lmovm for pt to return call.

## 2011-03-29 ENCOUNTER — Encounter: Payer: PRIVATE HEALTH INSURANCE | Admitting: *Deleted

## 2011-04-01 ENCOUNTER — Ambulatory Visit (INDEPENDENT_AMBULATORY_CARE_PROVIDER_SITE_OTHER): Payer: PRIVATE HEALTH INSURANCE | Admitting: *Deleted

## 2011-04-01 DIAGNOSIS — I4891 Unspecified atrial fibrillation: Secondary | ICD-10-CM

## 2011-04-01 LAB — POCT INR: INR: 3.2

## 2011-04-12 ENCOUNTER — Other Ambulatory Visit: Payer: Self-pay | Admitting: Internal Medicine

## 2011-04-13 NOTE — Telephone Encounter (Signed)
Refill done.  

## 2011-04-21 ENCOUNTER — Encounter: Payer: Self-pay | Admitting: Cardiovascular Disease

## 2011-04-21 ENCOUNTER — Ambulatory Visit (INDEPENDENT_AMBULATORY_CARE_PROVIDER_SITE_OTHER): Payer: PRIVATE HEALTH INSURANCE | Admitting: Cardiovascular Disease

## 2011-04-21 ENCOUNTER — Ambulatory Visit (INDEPENDENT_AMBULATORY_CARE_PROVIDER_SITE_OTHER): Payer: PRIVATE HEALTH INSURANCE | Admitting: Pharmacist

## 2011-04-21 DIAGNOSIS — I10 Essential (primary) hypertension: Secondary | ICD-10-CM

## 2011-04-21 DIAGNOSIS — Z7901 Long term (current) use of anticoagulants: Secondary | ICD-10-CM

## 2011-04-21 DIAGNOSIS — R011 Cardiac murmur, unspecified: Secondary | ICD-10-CM

## 2011-04-21 DIAGNOSIS — I509 Heart failure, unspecified: Secondary | ICD-10-CM

## 2011-04-21 DIAGNOSIS — E785 Hyperlipidemia, unspecified: Secondary | ICD-10-CM

## 2011-04-21 DIAGNOSIS — Z79899 Other long term (current) drug therapy: Secondary | ICD-10-CM

## 2011-04-21 DIAGNOSIS — I4891 Unspecified atrial fibrillation: Secondary | ICD-10-CM

## 2011-04-21 DIAGNOSIS — R609 Edema, unspecified: Secondary | ICD-10-CM

## 2011-04-21 LAB — POCT INR: INR: 2.2

## 2011-04-21 MED ORDER — FUROSEMIDE 40 MG PO TABS: 80.0000 mg | ORAL_TABLET | Freq: Two times a day (BID) | ORAL | Status: DC

## 2011-04-21 NOTE — Progress Notes (Signed)
Sara Carlson is seen today for f/U of afib, anticoagulation, MR and pulmonary hypertension. Despite her age she has been a reasonable coumadin candidate still living indep. and with no falls. Her INR's have bee Rx. She has had mild to moderate MR in the past but elevated PA pressures by echo disproportionately high at mid 80s. She has had lower extremity edema likely from pulmonary hypertension and has responded well to Lasix and aldactone. She is at assisted living [name] and [name] takes good care of her. She loves her Snickers candy but has to limit it because of gout. Despite the estimated PA pressure she has not acted like bad PHT with no significant SOB and given her age we have not been agressive about adding vasodilators  Complains of skin changes on coumadin  Recent URI  On sterioids with a lot more LE edema.    ROS: Denies fever, malais, weight loss, blurry vision, decreased visual acuity, cough, sputum, SOB, hemoptysis, pleuritic pain, palpitaitons, heartburn, abdominal pain, melena,  claudication, or rash.  All other systems reviewed and negative  General: Affect appropriate Elderly black female HEENT: normal Neck supple with no adenopathy JVP normal no bruits no thyromegaly Lungs clear with no wheezing and good diaphragmatic motion Heart:  S1/S2 SEM  murmur, no rub, gallop or click PMI normal Abdomen: benighn, BS positve, no tenderness, no AAA no bruit.  No HSM or HJR Distal pulses intact with no bruits Plus two bilateral edema Neuro non-focal Skin warm and dry No muscular weakness   Current Outpatient Prescriptions  Medication Sig Dispense Refill  . CALCIUM-VITAMIN D PO Take by mouth.        . carvedilol (COREG) 6.25 MG tablet Take 1 tablet (6.25 mg total) by mouth 2 (two) times daily.  60 tablet  11  . COUMADIN 2.5 MG tablet USE AS DIRECTED BY ANTICOAGULATION CLINIC.  50 tablet  3  . DIOVAN 160 MG tablet take 1 tablet by mouth once daily  90 tablet  1   . donepezil (ARICEPT) 10 MG tablet Take 10 mg by mouth at bedtime.        Marland Kitchen doxazosin (CARDURA) 8 MG tablet Take 1 tablet (8 mg total) by mouth at bedtime.  90 tablet  1  . felodipine (PLENDIL) 2.5 MG 24 hr tablet Take 2.5 mg by mouth daily.        . furosemide (LASIX) 40 MG tablet TAKE 1 TABLET BY MOUTH TWICE A DAY  180 tablet  2  . loratadine (CLARITIN) 5 MG chewable tablet Chew 5 mg by mouth daily.        Marland Kitchen NAMENDA 5 MG tablet take 1 tablet by mouth twice a day  180 tablet  1  . Polyethyl Glycol-Propyl Glycol (SYSTANE) 0.4-0.3 % SOLN Apply to eye.        . predniSONE (DELTASONE) 10 MG tablet 4 tablets x 2 days, 3 tabs x 2 days, 2 tabs x 2 days, 1 tab x 2 days  20 tablet  0  . spironolactone (ALDACTONE) 25 MG tablet take 1 tablet by mouth once daily  90 tablet  1    Allergies  Review of patient's allergies indicates no known allergies.  Electrocardiogram:  Assessment and Plan

## 2011-04-21 NOTE — Assessment & Plan Note (Signed)
Good rate contorl and asymptomatic

## 2011-04-21 NOTE — Patient Instructions (Signed)
Your physician recommends that you schedule a follow-up appointment in: 2-3 WEEKS WITH PA AND 6-8 WEEKS WITH DR [facility] Your physician has requested that you have an echocardiogram. Echocardiography is a painless test that uses sound waves to create images of your heart. It provides your doctor with information about the size and shape of your heart and how well your heart's chambers and valves are working. This procedure takes approximately one hour. There are no restrictions for this procedure. DX MURMUR Your physician has recommended you make the following change in your medication: FUROSEMIDE 80 MG TWICE DAILY  Your physician recommends that you return for lab work in: BMET 2-3 WEEKS  WITH PA APPT

## 2011-04-21 NOTE — Assessment & Plan Note (Signed)
Increase lasix and continue aldactone.  Hopefully prednisone made thinds worse.  Murmur of AS/AR louder  Check echo

## 2011-04-21 NOTE — Assessment & Plan Note (Signed)
Despite age she has not fallen and is still a candidate for coumadin

## 2011-04-21 NOTE — Assessment & Plan Note (Signed)
Cholesterol is at goal.  Continue current dose of statin and diet Rx.  No myalgias or side effects.  F/U  LFT's in 6 months. Lab Results  Component Value Date   LDLCALC 82 08/23/2010             

## 2011-04-21 NOTE — Assessment & Plan Note (Signed)
Well controlled.  Continue current medications and low sodium Dash type diet.    

## 2011-05-04 ENCOUNTER — Telehealth: Payer: Self-pay | Admitting: *Deleted

## 2011-05-04 ENCOUNTER — Other Ambulatory Visit: Payer: Self-pay

## 2011-05-04 ENCOUNTER — Ambulatory Visit (HOSPITAL_COMMUNITY): Payer: PRIVATE HEALTH INSURANCE | Attending: Cardiovascular Disease

## 2011-05-04 DIAGNOSIS — Z79899 Other long term (current) drug therapy: Secondary | ICD-10-CM

## 2011-05-04 DIAGNOSIS — E785 Hyperlipidemia, unspecified: Secondary | ICD-10-CM | POA: Insufficient documentation

## 2011-05-04 DIAGNOSIS — R011 Cardiac murmur, unspecified: Secondary | ICD-10-CM

## 2011-05-04 DIAGNOSIS — I2789 Other specified pulmonary heart diseases: Secondary | ICD-10-CM | POA: Insufficient documentation

## 2011-05-04 DIAGNOSIS — I4891 Unspecified atrial fibrillation: Secondary | ICD-10-CM | POA: Insufficient documentation

## 2011-05-04 DIAGNOSIS — I10 Essential (primary) hypertension: Secondary | ICD-10-CM | POA: Insufficient documentation

## 2011-05-04 NOTE — Telephone Encounter (Signed)
Echo shows high pressure in lungs.  Continue lasix 80 bid and aldactone 25mg   Add zaroxyln 2.5 daily before am lasix dose.  F/U BMET in 3 weeks

## 2011-05-04 NOTE — Telephone Encounter (Signed)
PT HERE TODAY FOR ECHO DAUGHTER ACCOMPANIED PT. the patient  C/O SWELLING UP TO KNEES WITH PAIN NOTED TO WHERE LEGS BEND  ONLY NOTED  THIS AS OF TODAY ENCOURAGED PT TO TAKE TYL FOR PAIN AND TO ELEVATE LEGS AS MUCH AS POSSIBLE ALSO TO TAKE  SECOND DOSE OF FUROSEMIDE IS CURRENTLY TAKING FUROSEMIDE 80 MG BID  HAS F/U WITH SCOTT WEAVER [facility]  ON 05/11/11 DAUGHTER ALSO STATED RECEIVED SCALES WILL START WEIGHING DAILY DAUGHTER AWARE WILL FORWARD TO DR Eden Emms

## 2011-05-05 MED ORDER — METOLAZONE 2.5 MG PO TABS: 2.5000 mg | ORAL_TABLET | Freq: Every day | ORAL | Status: DC

## 2011-05-05 NOTE — Telephone Encounter (Signed)
PT'S DAUGHTER  AWARE OF NEW TX PLAN WILL SEE SCOTT WEAVER PAC AS SCHEDULED NEXT WEEK./CY

## 2011-05-05 NOTE — Telephone Encounter (Signed)
Pt daughter rtn your call

## 2011-05-05 NOTE — Telephone Encounter (Signed)
ALSO PT'S DAUGHTER AWARE OF ECHO RESULTS./CY

## 2011-05-07 ENCOUNTER — Emergency Department (HOSPITAL_COMMUNITY)
Admission: EM | Admit: 2011-05-07 | Discharge: 2011-05-07 | Disposition: A | Discharge status: Home / Self Care | Payer: PRIVATE HEALTH INSURANCE | Attending: Emergency Medicine | Admitting: Emergency Medicine

## 2011-05-07 ENCOUNTER — Encounter (HOSPITAL_COMMUNITY): Payer: Self-pay

## 2011-05-07 ENCOUNTER — Other Ambulatory Visit: Payer: Self-pay

## 2011-05-07 DIAGNOSIS — E876 Hypokalemia: Secondary | ICD-10-CM | POA: Insufficient documentation

## 2011-05-07 DIAGNOSIS — I10 Essential (primary) hypertension: Secondary | ICD-10-CM | POA: Insufficient documentation

## 2011-05-07 DIAGNOSIS — E785 Hyperlipidemia, unspecified: Secondary | ICD-10-CM | POA: Insufficient documentation

## 2011-05-07 DIAGNOSIS — F341 Dysthymic disorder: Secondary | ICD-10-CM | POA: Insufficient documentation

## 2011-05-07 DIAGNOSIS — Z79899 Other long term (current) drug therapy: Secondary | ICD-10-CM | POA: Insufficient documentation

## 2011-05-07 DIAGNOSIS — F0390 Unspecified dementia without behavioral disturbance: Secondary | ICD-10-CM | POA: Insufficient documentation

## 2011-05-07 DIAGNOSIS — R609 Edema, unspecified: Secondary | ICD-10-CM | POA: Insufficient documentation

## 2011-05-07 DIAGNOSIS — I509 Heart failure, unspecified: Secondary | ICD-10-CM | POA: Insufficient documentation

## 2011-05-07 LAB — POCT I-STAT, CHEM 8
Glucose, Bld: 138 mg/dL — ABNORMAL HIGH (ref 70–99)
HCT: 35 % — ABNORMAL LOW (ref 36.0–46.0)
Hemoglobin: 11.9 g/dL — ABNORMAL LOW (ref 12.0–15.0)
Potassium: 2.7 mEq/L — CL (ref 3.5–5.1)
Sodium: 143 mEq/L (ref 135–145)

## 2011-05-07 MED ORDER — POTASSIUM CHLORIDE CRYS ER 20 MEQ PO TBCR
40.0000 meq | EXTENDED_RELEASE_TABLET | Freq: Once | ORAL | Status: AC
  Administered 2011-05-07: 40 meq via ORAL
  Filled 2011-05-07: qty 2

## 2011-05-07 NOTE — ED Notes (Signed)
Patient reports that she has had increased leg swelling x 2 weeks. Has been taking extra lasix per Dr. Eden Emms and started zaroxolyn yesterday as well with no relief. Reports that legs fell tight and painful.

## 2011-05-07 NOTE — Discharge Instructions (Signed)
Edema Edema is a buildup of fluids. It is most common in the feet, ankles, and legs. This happens more as a person ages. It may affect one or both legs. HOME CARE   Raise (elevate) the legs or ankles above the level of the heart while lying down.   Avoid sitting or standing still for a long time.   Exercise the legs to help the puffiness (swelling) go down.   A low-salt diet may help lessen the puffiness.   Only take medicine as told by your doctor.  GET HELP RIGHT AWAY IF:   You develop shortness of breath or chest pain.   You cannot breathe when you lie down.   You have more puffiness that does not go away with treatment.   You develop pain or redness in the areas that are puffy.   You have a temperature by mouth above 102 F (38.9 C), not controlled by medicine.   You gain 3 lb/1.4 kg or more in 1 day or 5 lb/2.3 kg in a week.  MAKE SURE YOU:   Understand these instructions.   Will watch your condition.   Will get help right away if you are not doing well or get worse.  Document Released: 07/20/2007 Document Revised: 01/20/2011 Document Reviewed: 07/20/2007 Exit[employer] Patient Information 2012 [employer]. 

## 2011-05-07 NOTE — ED Provider Notes (Signed)
History     CSN: [id number]  Arrival date & time 05/07/11  1243   First MD Initiated Contact with Patient 05/07/11 1412      Chief Complaint  Patient presents with  . Leg Swelling    (Consider location/radiation/quality/duration/timing/severity/associated sxs/prior treatment) HPI Complains of bilateral leg swelling and pain at insteps for 2 weeks. Pain is minimal at present. Patient seen by Dr. Eden Emms for same complaint, Lasix dose was doubled and she was started on Zaroxolyn yesterday, without relief. She denies shortness of breath denies fever denies trauma nothing makes symptoms better or worse discomfort is minimal at present no other complaint. No other associated symptoms. Past Medical History  Diagnosis Date  . CHF (congestive heart failure)   . Atrial fibrillation     paroxismal  . Hyperlipidemia   . Hypertension   . Anxiety and depression   . Dementia   . Allergic rhinitis     Past Surgical History  Procedure Date  . Abdominal hysterectomy     Family History  Problem Relation Age of Onset  . Colon cancer Neg Hx   . Breast cancer Neg Hx   . Diabetes Neg Hx   . Coronary artery disease Neg Hx     History  Substance Use Topics  . Smoking status: Never Smoker   . Smokeless tobacco: Not on file  . Alcohol Use: 0.0 oz/week    OB History    Grav Para Term Preterm Abortions TAB SAB Ect Mult Living                  Review of Systems  Constitutional: Negative.   HENT: Negative.   Respiratory: Negative.   Cardiovascular: Positive for leg swelling. Negative for chest pain and palpitations.  Gastrointestinal: Negative.   Psychiatric/Behavioral: Negative.   All other systems reviewed and are negative.    Allergies  Review of patient's allergies indicates no known allergies.  Home Medications   Current Outpatient Rx  Name Route Sig Dispense Refill  . CALCIUM-VITAMIN D PO Oral Take 1 tablet by mouth daily.     Marland Kitchen CARVEDILOL 6.25 MG PO TABS Oral Take 1  tablet (6.25 mg total) by mouth 2 (two) times daily. 60 tablet 11  . DIOVAN 160 MG PO TABS  take 1 tablet by mouth once daily 90 tablet 1  . DONEPEZIL HCL 10 MG PO TABS Oral Take 10 mg by mouth at bedtime.      Marland Kitchen DOXAZOSIN MESYLATE 8 MG PO TABS Oral Take 1 tablet (8 mg total) by mouth at bedtime. 90 tablet 1  . FELODIPINE ER 2.5 MG PO TB24 Oral Take 2.5 mg by mouth daily.      . FUROSEMIDE 40 MG PO TABS Oral Take 2 tablets (80 mg total) by mouth 2 (two) times daily. 60 tablet 6  . LORATADINE 5 MG PO CHEW Oral Chew 5 mg by mouth daily.      Marland Kitchen METOLAZONE 2.5 MG PO TABS Oral Take 1 tablet (2.5 mg total) by mouth daily. 30 tablet 6    TAKE 30 MIN BEFORE AM DOSE OF FUROSEMIDE./CY  . NAMENDA 5 MG PO TABS  take 1 tablet by mouth twice a day 180 tablet 1  . POLYETHYL GLYCOL-PROPYL GLYCOL 0.4-0.3 % OP SOLN Ophthalmic Apply 1 drop to eye daily.     Marland Kitchen SPIRONOLACTONE 25 MG PO TABS  take 1 tablet by mouth once daily 90 tablet 1  . WARFARIN SODIUM 2.5 MG PO TABS  BP 156/66  Pulse 84  Resp 16  SpO2 100%  Physical Exam  Nursing note and vitals reviewed. Constitutional: She appears well-developed and well-nourished.  HENT:  Head: Normocephalic and atraumatic.  Eyes: Conjunctivae are normal. Pupils are equal, round, and reactive to light.  Neck: Neck supple. No tracheal deviation present. No thyromegaly present.  Cardiovascular: Normal rate.   No murmur heard.      Irregular  Pulmonary/Chest: Effort normal and breath sounds normal.  Abdominal: Soft. Bowel sounds are normal. She exhibits no distension. There is no tenderness.  Musculoskeletal: She exhibits edema. She exhibits no tenderness.       2+ pretibial pitting edema bilaterally, all 4 extremities neurovascularly intact  Neurological: She is alert. Coordination normal.  Skin: Skin is warm and dry. No rash noted.  Psychiatric: She has a normal mood and affect.    Date: 05/07/2011  Rate: 70  Rhythm: atrial fibrillation  QRS Axis:  normal  Intervals: normal  ST/T Wave abnormalities: nonspecific ST/T changes  Conduction Disutrbances:none  Narrative Interpretation:   Old EKG Reviewed: changes noted Ventricular trigeminy present previous EKG from8/30/12 showed PVCs, otherwise unchanged  ED Course  Procedures (including critical care time)  Labs Reviewed - No data to display No results found.  3:30 PM he is resting comfortable No diagnosis found.    MDM  Renal insufficiency is chronic patient's serum creatinine was 1.6 on 10/14/2010 Plan continue present therapy Potassium chloride 40 mg by mouth prior to discharge. Peripheral edema is felt secondary to right heart failure Keep scheduled appointment with Dr. Eden Emms on 05/12/2011  Diagnosis #1 peripheral edema #2 chronic renal insufficiency #3 hypokalemia        Doug Sou, MD 05/07/11 1535

## 2011-05-09 ENCOUNTER — Telehealth: Payer: Self-pay | Admitting: Cardiovascular Disease

## 2011-05-09 NOTE — Telephone Encounter (Signed)
New Msg: Pt daughter calling wanting to speak with nurse/MD regarding pt current health condition. Pt  L hand is swollen and L leg is swollen as well. Pt daughter would like to speak with MD to be advised as to how to proceed. Please return pt daughter call to discuss further.

## 2011-05-09 NOTE — Telephone Encounter (Signed)
PT'S DAUGHTER  CALLED AGAIN C/O EDEMA TO HAND AND LEG PER PT  PAIN IN LEGS DUE TO SWELLING .  PT WAS IN ER ON SAT   WAS GIVEN KCL,  LABS, CXR, AND EKG WERE DONE AS WELL. DAUGHTER AWARE WILL FORWARD TO  DR Eden Emms FOR REVIEW .Zack Seal

## 2011-05-10 NOTE — Telephone Encounter (Signed)
Patient's daughter called, stating she has not seen patient this morning.States she has talked to her on phone and she sounded good.States she will be seeing her this morning and will call back if her swelling has not improved.States they will keep appointment on Thursday 05/12/11 with Dr.Nishan.Advised to call back if needed.

## 2011-05-10 NOTE — Telephone Encounter (Signed)
Patient was called, stated swelling was a little better this morning.Advised still waiting on advice from Dr.Nishan.

## 2011-05-11 ENCOUNTER — Ambulatory Visit: Payer: PRIVATE HEALTH INSURANCE | Admitting: Physician Assistant

## 2011-05-11 ENCOUNTER — Other Ambulatory Visit: Payer: PRIVATE HEALTH INSURANCE

## 2011-05-12 ENCOUNTER — Ambulatory Visit (INDEPENDENT_AMBULATORY_CARE_PROVIDER_SITE_OTHER): Payer: PRIVATE HEALTH INSURANCE | Admitting: Pharmacist

## 2011-05-12 ENCOUNTER — Encounter: Payer: PRIVATE HEALTH INSURANCE | Admitting: Physician Assistant

## 2011-05-12 ENCOUNTER — Encounter: Payer: Self-pay | Admitting: Physician Assistant

## 2011-05-12 ENCOUNTER — Other Ambulatory Visit: Payer: PRIVATE HEALTH INSURANCE

## 2011-05-12 DIAGNOSIS — I4891 Unspecified atrial fibrillation: Secondary | ICD-10-CM

## 2011-05-13 NOTE — Progress Notes (Signed)
This encounter was created in error - please disregard.

## 2011-05-19 ENCOUNTER — Other Ambulatory Visit: Payer: Self-pay | Admitting: Internal Medicine

## 2011-05-19 NOTE — Telephone Encounter (Signed)
Refill done.  

## 2011-05-23 ENCOUNTER — Encounter: Payer: Self-pay | Admitting: Cardiovascular Disease

## 2011-05-23 ENCOUNTER — Other Ambulatory Visit: Payer: Self-pay | Admitting: *Deleted

## 2011-05-23 ENCOUNTER — Ambulatory Visit (INDEPENDENT_AMBULATORY_CARE_PROVIDER_SITE_OTHER): Payer: PRIVATE HEALTH INSURANCE | Admitting: Cardiovascular Disease

## 2011-05-23 VITALS — BP 120/66 | HR 80 | Ht 62.0 in | Wt 138.1 lb

## 2011-05-23 DIAGNOSIS — Z79899 Other long term (current) drug therapy: Secondary | ICD-10-CM

## 2011-05-23 DIAGNOSIS — Z7901 Long term (current) use of anticoagulants: Secondary | ICD-10-CM

## 2011-05-23 DIAGNOSIS — I272 Pulmonary hypertension, unspecified: Secondary | ICD-10-CM

## 2011-05-23 DIAGNOSIS — I509 Heart failure, unspecified: Secondary | ICD-10-CM

## 2011-05-23 DIAGNOSIS — I2789 Other specified pulmonary heart diseases: Secondary | ICD-10-CM

## 2011-05-23 LAB — BASIC METABOLIC PANEL
BUN: 51 mg/dL — ABNORMAL HIGH (ref 6–23)
GFR: 31.77 mL/min — ABNORMAL LOW (ref >60.00)
Potassium: 2.8 mEq/L — CL (ref 3.5–5.1)

## 2011-05-23 MED ORDER — SILDENAFIL CITRATE 20 MG PO TABS: 20.0000 mg | ORAL_TABLET | Freq: Three times a day (TID) | ORAL | Status: DC

## 2011-05-23 MED ORDER — POTASSIUM CHLORIDE CRYS ER 20 MEQ PO TBCR: EXTENDED_RELEASE_TABLET | ORAL | Status: DC

## 2011-05-23 NOTE — Assessment & Plan Note (Signed)
F/U clinic  Since she has been on Rx coumadin have not done CT to r/o PE

## 2011-05-23 NOTE — Assessment & Plan Note (Signed)
Right sided failure with edema.  Start revatio if not cost prohibitive. Continue current diuretics.  BMET today  Arleady on coumadin for PAF

## 2011-05-23 NOTE — Assessment & Plan Note (Signed)
Cholesterol is at goal.  Continue current dose of statin and diet Rx.  No myalgias or side effects.  F/U  LFT's in 6 months. Lab Results  Component Value Date   LDLCALC 82 08/23/2010             

## 2011-05-23 NOTE — Progress Notes (Signed)
Sara Carlson is seen today for f/U of afib, anticoagulation, MR and pulmonary hypertension. Despite her age she has been a reasonable coumadin candidate still living indep. and with no falls. Her INR's have bee Rx. She has had mild to moderate MR in the past but elevated PA pressures by echo disproportionately high at mid 80s. She has had lower extremity edema likely from pulmonary hypertension and has responded well to Lasix and aldactone. She is at assisted living [name] and [name] takes good care of her. She loves her Snickers candy but has to limit it because of gout.  Discussed starting vasodilator to see if we can improve her dyspnea and make it possible to contorl Carlson sided failure with less diuretics  Weight is down about 30 lbs and she complains of "being poor" meaning she thinks she is getting two skinny  Complains of skin changes on coumadin   ROS: Denies fever, malais, weight loss, blurry vision, decreased visual acuity, cough, sputum, SOB, hemoptysis, pleuritic pain, palpitaitons, heartburn, abdominal pain, melena, lower extremity edema, claudication, or rash.  All other systems reviewed and negative  General: Affect appropriate Elderly black female HEENT: normal Neck supple with no adenopathy JVP elevated  no bruits no thyromegaly Lungs clear with no wheezing and good diaphragmatic motion Heart:  S1/S2 no murmur, no rub, gallop or click RV heave PMI normal Abdomen: benighn, BS positve, no tenderness, no AAA no bruit.  No HSM or HJR Distal pulses intact with no bruits Plus one bilateral  Edema much improved Neuro non-focal Skin warm and dry No muscular weakness   Current Outpatient Prescriptions  Medication Sig Dispense Refill  . CALCIUM-VITAMIN D PO Take 1 tablet by mouth daily.       . carvedilol (COREG) 6.25 MG tablet Take 1 tablet (6.25 mg total) by mouth 2 (two) times daily.  60 tablet  11  . DIOVAN 160 MG tablet take 1 tablet by mouth once  daily  90 tablet  1  . donepezil (ARICEPT) 10 MG tablet Take 10 mg by mouth at bedtime.        Marland Kitchen doxazosin (CARDURA) 8 MG tablet take 1 tablet by mouth at bedtime  90 tablet  1  . furosemide (LASIX) 40 MG tablet Take 2 tablets (80 mg total) by mouth 2 (two) times daily.  60 tablet  6  . loratadine (CLARITIN) 5 MG chewable tablet Chew 5 mg by mouth daily.        . metolazone (ZAROXOLYN) 2.5 MG tablet Take 1 tablet (2.5 mg total) by mouth daily.  30 tablet  6  . NAMENDA 5 MG tablet take 1 tablet by mouth twice a day  180 tablet  1  . Polyethyl Glycol-Propyl Glycol (SYSTANE) 0.4-0.3 % SOLN Apply 1 drop to eye daily.       Marland Kitchen spironolactone (ALDACTONE) 25 MG tablet take 1 tablet by mouth once daily  90 tablet  1  . warfarin (COUMADIN) 2.5 MG tablet         Allergies  Review of patient's allergies indicates no known allergies.  Electrocardiogram:  Assessment and Plan

## 2011-05-23 NOTE — Assessment & Plan Note (Signed)
Well controlled.  Continue current medications and low sodium Dash type diet.    

## 2011-05-23 NOTE — Patient Instructions (Addendum)
Your physician recommends that you schedule a follow-up appointment in: 3 MONTHS WITH DR Eden Emms Your physician recommends that you return for lab work in: TODAY BMET  DX V58.69 Your physician has recommended you make the following change in your medication: START REVATIO 20 MG  1 TAB THREE TIMES A DAY

## 2011-05-24 ENCOUNTER — Telehealth: Payer: Self-pay | Admitting: Cardiovascular Disease

## 2011-05-24 DIAGNOSIS — Z79899 Other long term (current) drug therapy: Secondary | ICD-10-CM

## 2011-05-24 NOTE — Telephone Encounter (Signed)
New Problem:    Ptient's daughter called in because she has had an emergency where she needs to leave town and wanted to ask what the dosages of her mother's medication were per a call she received yesterday.  Please call back.

## 2011-05-24 NOTE — Telephone Encounter (Signed)
Pt's daughter aware of lab results  Med changes and the need for repeat  Lab work./cy

## 2011-05-26 ENCOUNTER — Other Ambulatory Visit: Payer: PRIVATE HEALTH INSURANCE

## 2011-06-03 ENCOUNTER — Other Ambulatory Visit: Payer: Self-pay | Admitting: Cardiovascular Disease

## 2011-06-06 ENCOUNTER — Ambulatory Visit (INDEPENDENT_AMBULATORY_CARE_PROVIDER_SITE_OTHER): Payer: PRIVATE HEALTH INSURANCE | Admitting: *Deleted

## 2011-06-06 ENCOUNTER — Ambulatory Visit: Payer: PRIVATE HEALTH INSURANCE | Admitting: Cardiovascular Disease

## 2011-06-06 ENCOUNTER — Other Ambulatory Visit (INDEPENDENT_AMBULATORY_CARE_PROVIDER_SITE_OTHER): Payer: PRIVATE HEALTH INSURANCE

## 2011-06-06 DIAGNOSIS — R609 Edema, unspecified: Secondary | ICD-10-CM

## 2011-06-06 DIAGNOSIS — I4891 Unspecified atrial fibrillation: Secondary | ICD-10-CM

## 2011-06-06 DIAGNOSIS — Z79899 Other long term (current) drug therapy: Secondary | ICD-10-CM

## 2011-06-06 LAB — POCT INR: INR: 2.7

## 2011-06-07 ENCOUNTER — Encounter: Payer: Self-pay | Admitting: *Deleted

## 2011-06-07 ENCOUNTER — Other Ambulatory Visit: Payer: Self-pay | Admitting: *Deleted

## 2011-06-07 DIAGNOSIS — I509 Heart failure, unspecified: Secondary | ICD-10-CM

## 2011-06-07 LAB — BASIC METABOLIC PANEL
Chloride: 106 mEq/L (ref 96–112)
Creatinine, Ser: 2.1 mg/dL — ABNORMAL HIGH (ref 0.4–1.2)
Potassium: 4.8 mEq/L (ref 3.5–5.1)

## 2011-06-16 ENCOUNTER — Ambulatory Visit (INDEPENDENT_AMBULATORY_CARE_PROVIDER_SITE_OTHER): Payer: PRIVATE HEALTH INSURANCE | Admitting: Cardiovascular Disease

## 2011-06-16 ENCOUNTER — Encounter: Payer: Self-pay | Admitting: Cardiovascular Disease

## 2011-06-16 VITALS — BP 129/82 | HR 72 | Wt 142.0 lb

## 2011-06-16 DIAGNOSIS — I2789 Other specified pulmonary heart diseases: Secondary | ICD-10-CM

## 2011-06-16 DIAGNOSIS — Z79899 Other long term (current) drug therapy: Secondary | ICD-10-CM

## 2011-06-16 DIAGNOSIS — I272 Pulmonary hypertension, unspecified: Secondary | ICD-10-CM

## 2011-06-16 NOTE — Progress Notes (Signed)
Patient ID: Sara Carlson, female   DOB: 07-20-1922, 78 y.o.   MRN: [id number] Sara Carlson is seen today for f/U of afib, anticoagulation, MR and pulmonary hypertension. Despite her age she has been a reasonable coumadin candidate still living indep. and with no falls. Her INR's have bee Rx. She has had mild to moderate MR in the past but elevated PA pressures by echo disproportionately high at mid 80s. She has had lower extremity edema likely from pulmonary hypertension and has responded well to Lasix and aldactone. She is at assisted living [name] and [name] takes good care of her. She loves her Snickers candy but has to limit it because of gout.   Started on revatio last visit and seems to be tolerating and doing well.  Weight up 4lbs as she was too dry last visit and she feels stronger  ROS: Denies fever, malais, weight loss, blurry vision, decreased visual acuity, cough, sputum, SOB, hemoptysis, pleuritic pain, palpitaitons, heartburn, abdominal pain, melena, lower extremity edema, claudication, or rash.  All other systems reviewed and negative  General: Affect appropriate Healthy:  appears stated age HEENT: normal Neck supple with no adenopathy JVP normal no bruits no thyromegaly Lungs clear with no wheezing and good diaphragmatic motion Heart:  S1/S2 no murmur, no rub, gallop or click PMI normal Abdomen: benighn, BS positve, no tenderness, no AAA no bruit.  No HSM or HJR Distal pulses intact with no bruits No edema Neuro non-focal Skin warm and dry No muscular weakness   Current Outpatient Prescriptions  Medication Sig Dispense Refill  . CALCIUM-VITAMIN D PO Take 1 tablet by mouth daily.       . carvedilol (COREG) 6.25 MG tablet Take 1 tablet (6.25 mg total) by mouth 2 (two) times daily.  60 tablet  11  . COUMADIN 2.5 MG tablet USE AS DIRECTED BY ANTICOAGULATION CLINIC.  50 tablet  3  . DIOVAN 160 MG tablet take 1 tablet by mouth once daily  90 tablet  1  .  donepezil (ARICEPT) 10 MG tablet Take 10 mg by mouth at bedtime.        Marland Kitchen doxazosin (CARDURA) 8 MG tablet take 1 tablet by mouth at bedtime  90 tablet  1  . furosemide (LASIX) 40 MG tablet Take 40 mg by mouth 2 (two) times daily.      Marland Kitchen loratadine (CLARITIN) 5 MG chewable tablet Chew 5 mg by mouth daily.        . metolazone (ZAROXOLYN) 2.5 MG tablet Take 1 tablet (2.5 mg total) by mouth daily.  30 tablet  6  . NAMENDA 5 MG tablet take 1 tablet by mouth twice a day  180 tablet  1  . Polyethyl Glycol-Propyl Glycol (SYSTANE) 0.4-0.3 % SOLN Apply 1 drop to eye daily.       . potassium chloride SA (K-DUR,KLOR-CON) 20 MEQ tablet Take 2 tonight and then as directed by Dr Eden Emms  60 tablet  1  . sildenafil (REVATIO) 20 MG tablet Take 1 tablet (20 mg total) by mouth 3 (three) times daily.  90 tablet  6  . spironolactone (ALDACTONE) 25 MG tablet take 1 tablet by mouth once daily  90 tablet  1  . DISCONTD: furosemide (LASIX) 40 MG tablet Take 2 tablets (80 mg total) by mouth 2 (two) times daily.  60 tablet  6  . DISCONTD: warfarin (COUMADIN) 2.5 MG tablet         Allergies  Review of patient's allergies  indicates no known allergies.  Electrocardiogram:  Assessment and Plan

## 2011-06-16 NOTE — Assessment & Plan Note (Signed)
Stabilized on current diuretic regiman and additoin of revatio.  F/U BMET in 3 weeks

## 2011-06-16 NOTE — Assessment & Plan Note (Signed)
Good rate control and anticoagulation  Coumadin clinic today  

## 2011-06-16 NOTE — Patient Instructions (Signed)
Your physician recommends that you schedule a follow-up appointment in: 3 MONTHS WITH DR [facility] Your physician recommends that you continue on your current medications as directed. Please refer to the Current Medication list given to you today. Your physician recommends that you return for lab work in: 3 WEEKS   BMET  DX V58.69

## 2011-06-16 NOTE — Assessment & Plan Note (Signed)
Cholesterol is at goal.  Continue current dose of statin and diet Rx.  No myalgias or side effects.  F/U  LFT's in 6 months. Lab Results  Component Value Date   LDLCALC 82 08/23/2010             

## 2011-06-16 NOTE — Assessment & Plan Note (Signed)
Well controlled.  Continue current medications and low sodium Dash type diet.    

## 2011-06-24 ENCOUNTER — Other Ambulatory Visit: Payer: Self-pay | Admitting: Internal Medicine

## 2011-06-24 NOTE — Telephone Encounter (Signed)
Refill done.  

## 2011-07-04 ENCOUNTER — Ambulatory Visit (INDEPENDENT_AMBULATORY_CARE_PROVIDER_SITE_OTHER): Payer: PRIVATE HEALTH INSURANCE

## 2011-07-04 ENCOUNTER — Other Ambulatory Visit (INDEPENDENT_AMBULATORY_CARE_PROVIDER_SITE_OTHER): Payer: PRIVATE HEALTH INSURANCE

## 2011-07-04 DIAGNOSIS — I4891 Unspecified atrial fibrillation: Secondary | ICD-10-CM

## 2011-07-04 DIAGNOSIS — Z79899 Other long term (current) drug therapy: Secondary | ICD-10-CM

## 2011-07-04 LAB — BASIC METABOLIC PANEL
CO2: 20 mEq/L (ref 19–32)
Calcium: 9 mg/dL (ref 8.4–10.5)
Chloride: 112 mEq/L (ref 96–112)
Sodium: 139 mEq/L (ref 135–145)

## 2011-07-04 LAB — POCT INR: INR: 2

## 2011-07-05 ENCOUNTER — Other Ambulatory Visit: Payer: PRIVATE HEALTH INSURANCE

## 2011-07-26 ENCOUNTER — Telehealth: Payer: Self-pay | Admitting: Cardiovascular Disease

## 2011-07-26 NOTE — Telephone Encounter (Signed)
Spoke with pt dtr, for the last couple days the pt has had swelling in her left hand. The edema is significant in the left hand and wrist, there is also a little swelling in the right leg. The dtr has been out of town and the pt has missed a couple doses of her meds but they are back on track today. The pt denies SOB. She c/o not having an appetite but has eaten lunch today. The pt is not c/o trouble breathing when lying flat, just that her hand hurts. She is taking tylenol for the pain. Pt meds confirmed with dtr. dtr aware dr Eden Emms is gone for today and will be back in the office tomorrow. The pts dtr would like to wait and have dr Eden Emms make recommendations. Will forward for dr nishan's review

## 2011-07-26 NOTE — Telephone Encounter (Signed)
New Problem:    Patient's daughter called in because her mother's left hand is swollen with a lot of fluid and would like to know how to proceed.  Please call back.

## 2011-07-27 ENCOUNTER — Ambulatory Visit (INDEPENDENT_AMBULATORY_CARE_PROVIDER_SITE_OTHER): Payer: PRIVATE HEALTH INSURANCE | Admitting: Internal Medicine

## 2011-07-27 VITALS — BP 134/68 | HR 64 | Temp 98.3°F | Wt 141.0 lb

## 2011-07-27 DIAGNOSIS — M109 Gout, unspecified: Secondary | ICD-10-CM

## 2011-07-27 DIAGNOSIS — D649 Anemia, unspecified: Secondary | ICD-10-CM

## 2011-07-27 DIAGNOSIS — I10 Essential (primary) hypertension: Secondary | ICD-10-CM

## 2011-07-27 DIAGNOSIS — F341 Dysthymic disorder: Secondary | ICD-10-CM

## 2011-07-27 DIAGNOSIS — F0390 Unspecified dementia without behavioral disturbance: Secondary | ICD-10-CM

## 2011-07-27 LAB — CBC WITH DIFFERENTIAL/PLATELET
Basophils Absolute: 0 10*3/uL (ref 0.0–0.1)
Eosinophils Relative: 0.5 % (ref 0.0–5.0)
Lymphocytes Relative: 24.1 % (ref 12.0–46.0)
Monocytes Relative: 15.2 % — ABNORMAL HIGH (ref 3.0–12.0)
Neutrophils Relative %: 59.8 % (ref 43.0–77.0)
Platelets: 191 10*3/uL (ref 150.0–400.0)
RDW: 21.1 % — ABNORMAL HIGH (ref 11.5–14.6)
WBC: 5.9 10*3/uL (ref 4.5–10.5)

## 2011-07-27 LAB — VITAMIN B12: Vitamin B-12: 536 pg/mL (ref 211–911)

## 2011-07-27 LAB — AST: AST: 18 U/L (ref 0–37)

## 2011-07-27 LAB — URIC ACID: Uric Acid, Serum: 10.7 mg/dL — ABNORMAL HIGH (ref 2.4–7.0)

## 2011-07-27 LAB — ALT: ALT: 7 U/L (ref 0–35)

## 2011-07-27 LAB — FOLATE: Folate: 6 ng/mL (ref >5.9)

## 2011-07-27 MED ORDER — FEBUXOSTAT 40 MG PO TABS: 40.0000 mg | ORAL_TABLET | Freq: Every day | ORAL | Status: DC

## 2011-07-27 MED ORDER — PREDNISONE 10 MG PO TABS: ORAL_TABLET | ORAL | Status: DC

## 2011-07-27 NOTE — Telephone Encounter (Signed)
Spoke with pt dtr, aware of dr Fabio Bering recommendations

## 2011-07-27 NOTE — Patient Instructions (Addendum)
Please schedule a visit with Ms. Sara Carlson, she's a new counselor  that works in this  location.

## 2011-07-27 NOTE — Assessment & Plan Note (Signed)
Gradual deterioration over time according to the patient's daughter, she seems to be doing very well today. Continue with same medicines. Will check a B12 and folic acid levels.

## 2011-07-27 NOTE — Assessment & Plan Note (Signed)
History of gout, has episodes of in December, few months ago and 2 days ago. She would like to take something daily instead of when necessary medicines. Plan:  Prednisone for acute treatment Start uloric 40 mg daily. Labs

## 2011-07-27 NOTE — Assessment & Plan Note (Signed)
Well-controlled, no change 

## 2011-07-27 NOTE — Telephone Encounter (Signed)
Continue normal dose of diuretic

## 2011-07-27 NOTE — Assessment & Plan Note (Signed)
We had a long conversation about depression with the patient, essentially she is depressed because she lived in [place] all her life and now she lives in [place]. She would like to go back to [place] however logistically that is very difficult. Patient is counseled today. We talk about medication, she declines to take medicines "I just need to go back to s [place]" With about counseling and she is willing to receive counseling. See instructions.

## 2011-07-27 NOTE — Progress Notes (Signed)
  Subjective:    Patient ID: Sara [place], female    DOB: Apr 29, 1922, 78 y.o.   MRN: [id number]  HPI Routine visit, here with her daughter. 2 days ago developed pain, redness and swelling at the left hand. Dementia, daughter reports that her memory is gradually getting worse, often times the patient misplaced or lost a number of things. Hypertension, good medication compliance, not ambulatory BPs. The patient reports that she is feeling depressed, symptoms due to moving to [place], she misses her friends and church in [place].   Past Medical History:  Congestive heart failure  A. Fib, paroxismal  Hyperlipidemia  Hypertension  anxiety-depression  dementia  ALLERGIC RHINITIS  Gout   Past Surgical History:  Hysterectomy, no oophorectomy   Family History:  colon ca--no  breast ca--no  DM--no  CAD--no   Social History:  Moved from [place] to [place] Feb. 2008 ; 1 son ([place]) , 1 daughter ([place]) lives in her appartment "independent living"  ADLs: independent, see HPI  doesn't drive  Daughter [name] is a friend of Dr. Eden Emms and works at [employer] day care.  no tobacco  ETOH-- wine sometimes    Review of Systems good medication compliance, the patient's daughter places meds in  a pillbox. No chest pain or shortness of breath No lower extremity edema. She has depression, denies anxiety or suicidal thoughts. She has gout at the left wrist, she had an episode in December and another one a few months ago. Would like to explore medication to preventive gout medicine. Denies any recent injury at the left wrist      Objective:   Physical Exam  General -- alert, well-developed, and well-nourished.    Lungs -- normal respiratory effort, no intercostal retractions, no accessory muscle use, and normal breath sounds.   Heart-- irregularly irregular rate, has a systolic murmur   Extremities--left wrist w/  swelling, tenderness, slightly warm. No deformities. Neurologic-- alert &  oriented to self, place, month, did not recall what year is this. She seems quite coherent today. Psych-- Cognition and judgment appear intact. Alert and cooperative with normal attention span and concentration.  not anxious appearing and not depressed appearing.       Assessment & Plan:

## 2011-07-28 ENCOUNTER — Telehealth: Payer: Self-pay | Admitting: *Deleted

## 2011-07-28 ENCOUNTER — Encounter: Payer: Self-pay | Admitting: Internal Medicine

## 2011-07-28 NOTE — Telephone Encounter (Signed)
Preferred med is allopurinol. Please advise if change in med is appropriate or Is PA needed.

## 2011-07-29 NOTE — Telephone Encounter (Signed)
I prefer uloric, proceed with a PA

## 2011-07-29 NOTE — Telephone Encounter (Signed)
Called and requesting PA form awaiting fax.

## 2011-08-01 ENCOUNTER — Ambulatory Visit (INDEPENDENT_AMBULATORY_CARE_PROVIDER_SITE_OTHER): Payer: PRIVATE HEALTH INSURANCE

## 2011-08-01 DIAGNOSIS — I4891 Unspecified atrial fibrillation: Secondary | ICD-10-CM

## 2011-08-01 LAB — POCT INR: INR: 2.7

## 2011-08-02 ENCOUNTER — Telehealth: Payer: Self-pay | Admitting: Internal Medicine

## 2011-08-02 NOTE — Telephone Encounter (Signed)
called regarding additional labs dr Drue Novel needed, patient stated she would have her daughter call back to schedule the appointment as this would be her transportation, once scheduled let Lillia Abed Know

## 2011-08-02 NOTE — Telephone Encounter (Signed)
PA placed on ledge to complete question 2 to provide specific why Pt is unable to take allopurinol. Please advise

## 2011-08-02 NOTE — Addendum Note (Signed)
Addended by: Edwena Felty T on: 08/02/2011 11:51 AM   Modules accepted: Orders

## 2011-08-03 ENCOUNTER — Other Ambulatory Visit (INDEPENDENT_AMBULATORY_CARE_PROVIDER_SITE_OTHER): Payer: PRIVATE HEALTH INSURANCE

## 2011-08-03 DIAGNOSIS — I4891 Unspecified atrial fibrillation: Secondary | ICD-10-CM

## 2011-08-03 DIAGNOSIS — D649 Anemia, unspecified: Secondary | ICD-10-CM

## 2011-08-03 DIAGNOSIS — I509 Heart failure, unspecified: Secondary | ICD-10-CM

## 2011-08-03 LAB — IRON: Iron: 45 ug/dL (ref 42–145)

## 2011-08-03 LAB — FERRITIN: Ferritin: 65.3 ng/mL (ref 10.0–291.0)

## 2011-08-03 LAB — BASIC METABOLIC PANEL
CO2: 23 mEq/L (ref 19–32)
Chloride: 111 mEq/L (ref 96–112)
Creatinine, Ser: 1.4 mg/dL — ABNORMAL HIGH (ref 0.4–1.2)
Potassium: 4 mEq/L (ref 3.5–5.1)
Sodium: 141 mEq/L (ref 135–145)

## 2011-08-03 NOTE — Telephone Encounter (Signed)
Patient scheduled coming this wk

## 2011-08-04 NOTE — Telephone Encounter (Signed)
Prior Auth approved through 02-14-12 under your medicare Part D benefits, approval letter scan to chart, pharmacy faxed.

## 2011-08-12 ENCOUNTER — Telehealth: Payer: Self-pay | Admitting: *Deleted

## 2011-08-12 NOTE — Telephone Encounter (Signed)
Spoke with Cyndia Bent at [phone]. The patient is having loose stools with no fever, chills, melena for several days, the only new medication is uloric  but the symptoms begun  before that medication was initiated. She has been taking Namenda and Aricept for a while Plan: Metamucil one tablet twice a day, office visit next week if not improving.

## 2011-08-12 NOTE — Telephone Encounter (Signed)
Pt daughter Rhea Pink called in to advise that MD Drue Novel advised during last OV if pt starts to note diarrhea while taking new combo of Namenda and aricept to please let MD Drue Novel know, pt daughter notes this has occurred several times a week after 2 weeks when pt started the combonation on 06-24-11, please note and advise

## 2011-08-12 NOTE — Telephone Encounter (Signed)
I need to talk w/ Jackelyn Poling , what is her phone # ??

## 2011-08-12 NOTE — Telephone Encounter (Signed)
Per chart [id number]

## 2011-08-16 ENCOUNTER — Other Ambulatory Visit: Payer: Self-pay | Admitting: Internal Medicine

## 2011-08-16 NOTE — Telephone Encounter (Signed)
Refill done.  

## 2011-08-23 ENCOUNTER — Other Ambulatory Visit: Payer: Self-pay | Admitting: Internal Medicine

## 2011-08-23 NOTE — Telephone Encounter (Signed)
Refill done.  

## 2011-09-13 ENCOUNTER — Telehealth: Payer: Self-pay | Admitting: Internal Medicine

## 2011-09-13 DIAGNOSIS — F0390 Unspecified dementia without behavioral disturbance: Secondary | ICD-10-CM

## 2011-09-13 NOTE — Telephone Encounter (Signed)
Please advise 

## 2011-09-13 NOTE — Telephone Encounter (Signed)
Yes, please arrange

## 2011-09-13 NOTE — Telephone Encounter (Signed)
Pt. Daughter called & stated she would like a referral for  [facility] to come out & help with moms dementia CB# [phone]

## 2011-09-15 ENCOUNTER — Ambulatory Visit (INDEPENDENT_AMBULATORY_CARE_PROVIDER_SITE_OTHER): Payer: PRIVATE HEALTH INSURANCE

## 2011-09-15 DIAGNOSIS — I4891 Unspecified atrial fibrillation: Secondary | ICD-10-CM

## 2011-09-15 LAB — POCT INR: INR: 1.8

## 2011-09-15 NOTE — Telephone Encounter (Signed)
Spoke with [facility] & the pt's insurance will not cover their services. I contacted the pt's daughter & she has asked that we check with [facility].

## 2011-09-20 ENCOUNTER — Ambulatory Visit: Payer: PRIVATE HEALTH INSURANCE | Admitting: Cardiovascular Disease

## 2011-09-20 ENCOUNTER — Other Ambulatory Visit: Payer: Self-pay | Admitting: Cardiovascular Disease

## 2011-09-20 NOTE — Telephone Encounter (Signed)
Fax Received. Refill Completed. Sara Carlson (R.M.A)   

## 2011-09-23 NOTE — Telephone Encounter (Signed)
The referral has been entered, notified pt's daughter.

## 2011-09-23 NOTE — Telephone Encounter (Signed)
Spoke with pt's daughter to let her know [facility] will be in contact on Tuesday.

## 2011-09-23 NOTE — Telephone Encounter (Signed)
Please call back with update, can call [phone] - 1st then [phone] (cell) Would like an update today if possible

## 2011-10-03 ENCOUNTER — Other Ambulatory Visit: Payer: Self-pay | Admitting: Cardiovascular Disease

## 2011-10-07 DIAGNOSIS — I509 Heart failure, unspecified: Secondary | ICD-10-CM

## 2011-10-07 DIAGNOSIS — I4891 Unspecified atrial fibrillation: Secondary | ICD-10-CM

## 2011-10-07 DIAGNOSIS — F0390 Unspecified dementia without behavioral disturbance: Secondary | ICD-10-CM

## 2011-10-07 DIAGNOSIS — I10 Essential (primary) hypertension: Secondary | ICD-10-CM

## 2011-10-13 ENCOUNTER — Ambulatory Visit: Payer: Self-pay | Admitting: Cardiovascular Disease

## 2011-10-13 DIAGNOSIS — I4891 Unspecified atrial fibrillation: Secondary | ICD-10-CM

## 2011-10-13 LAB — POCT INR: INR: 1.9

## 2011-11-03 ENCOUNTER — Other Ambulatory Visit: Payer: Self-pay | Admitting: Internal Medicine

## 2011-11-03 ENCOUNTER — Ambulatory Visit (INDEPENDENT_AMBULATORY_CARE_PROVIDER_SITE_OTHER): Payer: PRIVATE HEALTH INSURANCE | Admitting: *Deleted

## 2011-11-03 DIAGNOSIS — I4891 Unspecified atrial fibrillation: Secondary | ICD-10-CM

## 2011-11-14 ENCOUNTER — Other Ambulatory Visit: Payer: Self-pay | Admitting: Internal Medicine

## 2011-11-14 NOTE — Telephone Encounter (Signed)
Refill done.  

## 2011-11-17 ENCOUNTER — Telehealth: Payer: Self-pay | Admitting: Internal Medicine

## 2011-11-17 NOTE — Telephone Encounter (Signed)
I have contacted Melissa @ [facility].

## 2011-11-17 NOTE — Telephone Encounter (Signed)
I spoke with Sara Carlson, patient's dementia is getting worse. We tried to get [employer] to help  but that didn't work. We recently sent some papers to get her a CNA to visit her few hours a day. Sara Carlson is not not aware of any fever, chills, dysuria, new medicines or head injury. Sara Carlson will schedule a visit to see me next week. In the long-term, she is working on getting her in a  assisted living facility. Sara Carlson,  please be sure we send papers to [facility] to get a CNA to help her.

## 2011-11-17 NOTE — Telephone Encounter (Signed)
as per conversation with ms. miller mom is getting worse , requested to speak with Dr.Paz, stated she is in great need of help. It appears we have tried [facility] but pt does not qualify and we have also sent for review to [facility].-Pt on phone with dr.paz now

## 2011-11-18 ENCOUNTER — Ambulatory Visit (INDEPENDENT_AMBULATORY_CARE_PROVIDER_SITE_OTHER): Payer: PRIVATE HEALTH INSURANCE | Admitting: Internal Medicine

## 2011-11-18 ENCOUNTER — Encounter: Payer: Self-pay | Admitting: Internal Medicine

## 2011-11-18 ENCOUNTER — Ambulatory Visit: Payer: PRIVATE HEALTH INSURANCE | Admitting: Cardiovascular Disease

## 2011-11-18 VITALS — BP 134/78 | HR 69 | Temp 98.2°F | Wt 131.0 lb

## 2011-11-18 DIAGNOSIS — I10 Essential (primary) hypertension: Secondary | ICD-10-CM

## 2011-11-18 DIAGNOSIS — F341 Dysthymic disorder: Secondary | ICD-10-CM

## 2011-11-18 DIAGNOSIS — N189 Chronic kidney disease, unspecified: Secondary | ICD-10-CM

## 2011-11-18 DIAGNOSIS — Z23 Encounter for immunization: Secondary | ICD-10-CM

## 2011-11-18 DIAGNOSIS — M109 Gout, unspecified: Secondary | ICD-10-CM

## 2011-11-18 DIAGNOSIS — F0390 Unspecified dementia without behavioral disturbance: Secondary | ICD-10-CM

## 2011-11-18 LAB — BASIC METABOLIC PANEL
BUN: 102 mg/dL (ref 6–23)
CO2: 24 mEq/L (ref 19–32)
Calcium: 10 mg/dL (ref 8.4–10.5)
GFR: 22.37 mL/min — ABNORMAL LOW (ref >60.00)
Glucose, Bld: 116 mg/dL — ABNORMAL HIGH (ref 70–99)
Potassium: 4.8 mEq/L (ref 3.5–5.1)

## 2011-11-18 LAB — CBC WITH DIFFERENTIAL/PLATELET
Basophils Absolute: 0 10*3/uL (ref 0.0–0.1)
Eosinophils Absolute: 0.1 10*3/uL (ref 0.0–0.7)
HCT: 36.2 % (ref 36.0–46.0)
Lymphs Abs: 2.5 10*3/uL (ref 0.7–4.0)
MCHC: 32.6 g/dL (ref 30.0–36.0)
Monocytes Absolute: 0.6 10*3/uL (ref 0.1–1.0)
Monocytes Relative: 9.5 % (ref 3.0–12.0)
Neutro Abs: 3.6 10*3/uL (ref 1.4–7.7)
Platelets: 286 10*3/uL (ref 150.0–400.0)
RDW: 14.6 % (ref 11.5–14.6)

## 2011-11-18 NOTE — Assessment & Plan Note (Signed)
Uric acid elevated, she has no episodes of gout. Recommend observation.

## 2011-11-18 NOTE — Assessment & Plan Note (Signed)
We again discussed this issue, she again told me is not ready to take any medications

## 2011-11-18 NOTE — Telephone Encounter (Signed)
[facility] is unable to help. Papers faxed to [facility] per request.

## 2011-11-18 NOTE — Assessment & Plan Note (Addendum)
Dementia gradually getting worse according to the  patient's daughter, MMSE 12 today, moderate dementia. Good compliance with Aricept and Namenda Recent B12 normal Plan: We are trying to get her some help. See phone note from yesterday.

## 2011-11-18 NOTE — Progress Notes (Signed)
  Subjective:    Patient ID: Sara Carlson, female    DOB: Nov 04, 1922, 78 y.o.   MRN: [id number]  HPI Acute visit Please see phone note from yesterday, she is here because the patient's daughter thinks her memory is getting worse.  Past Medical History:   Congestive heart failure   A. Fib, paroxismal   Hyperlipidemia   Hypertension   anxiety-depression   dementia   ALLERGIC RHINITIS   Gout   Past Surgical History:   Hysterectomy, no oophorectomy   Family History:   colon ca--no   breast ca--no   DM--no   CAD--no   Social History:   Moved from [place] to [place] Feb. 2008 ; 1 son ([place]) , 1 daughter ([place]) lives in her appartment "independent living"   ADLs: independent, see HPI   doesn't drive   Daughter [name] is a friend of Dr. Eden Emms and works at [employer] day care.   no tobacco   ETOH-- wine sometimes    Review of Systems Patient denies headaches, nausea, vomiting, she occasionally has diarrhea but no blood in the stools. No dysuria or gross hematuria As far as anxiety and depression, she states that she still  feels that way, see assessment and plan.     Objective:   Physical Exam Vital signs stable MMSE scored 12, moderate dementia       Assessment & Plan:  Today , I spent more than 25 min with the patient, >50% of the time counseling, and doing a detailed exam (MMSE) as well as coordinating her care (FMLA)

## 2011-11-18 NOTE — Assessment & Plan Note (Signed)
Seems to be well-controlled, check a BMP. Last hemoglobin was slightly low, Iron normal. Recheck a CBC.

## 2011-11-18 NOTE — Assessment & Plan Note (Addendum)
Addendum, BMP come back, creatinine elevated Patient has some degree of renal insufficiency, today creatinine is definitely higher than baseline, 2.6 and has a elevated BUN. Chart reviewed, is no recent renal imaging. Plan: Ultrasound of the kidney No NSAIDs Drink plenty of fluids. Decrease Lasix 40 mg from twice a day to once a day Recheck -- 1 month Se "labs"

## 2011-11-22 ENCOUNTER — Ambulatory Visit (INDEPENDENT_AMBULATORY_CARE_PROVIDER_SITE_OTHER): Payer: PRIVATE HEALTH INSURANCE | Admitting: *Deleted

## 2011-11-22 DIAGNOSIS — I4891 Unspecified atrial fibrillation: Secondary | ICD-10-CM

## 2011-11-22 LAB — POCT INR: INR: 2.2

## 2011-11-23 ENCOUNTER — Ambulatory Visit
Admission: RE | Admit: 2011-11-23 | Discharge: 2011-11-23 | Disposition: A | Discharge status: Home / Self Care | Payer: PRIVATE HEALTH INSURANCE | Source: Ambulatory Visit | Attending: Internal Medicine | Admitting: Internal Medicine

## 2011-11-23 DIAGNOSIS — N189 Chronic kidney disease, unspecified: Secondary | ICD-10-CM

## 2011-11-25 ENCOUNTER — Telehealth: Payer: Self-pay | Admitting: Internal Medicine

## 2011-11-25 NOTE — Telephone Encounter (Signed)
Paperwork from [place] that you faxed off didn't have the date on the forms (daughter says this was the paperwork that also didn't have the medical notes attached). She needs the paperwork re-faxed to [facility] with the date put on. I didn't see this paperwork that she was talking about- I don't see that it came through me so I wouldn't have copies I hope you do. Let me know if I can help. Daughter is pretty upset. amy

## 2011-11-25 NOTE — Telephone Encounter (Signed)
Spoke with pt's daughter to verify that everything i have already faxed over was the correct paperwork & she said yes. She gave me a phone number to call [facility] to see what else it is that they need.

## 2011-11-25 NOTE — Telephone Encounter (Signed)
[employer] & spoke with Stanton Kid[facility]. She advised me what exactly need to be faxed over. I re-faxed form to [phone] with correct dates.

## 2011-12-06 ENCOUNTER — Other Ambulatory Visit: Payer: Self-pay | Admitting: Internal Medicine

## 2011-12-06 ENCOUNTER — Ambulatory Visit (INDEPENDENT_AMBULATORY_CARE_PROVIDER_SITE_OTHER): Payer: PRIVATE HEALTH INSURANCE | Admitting: *Deleted

## 2011-12-06 DIAGNOSIS — I4891 Unspecified atrial fibrillation: Secondary | ICD-10-CM

## 2011-12-06 MED ORDER — FUROSEMIDE 40 MG PO TABS: 40.0000 mg | ORAL_TABLET | Freq: Every day | ORAL | Status: AC

## 2011-12-06 NOTE — Telephone Encounter (Signed)
Refill done.  

## 2011-12-06 NOTE — Telephone Encounter (Signed)
refill  Furosemide (Tab) 40 MG Take 40 mg by mouth daily #90 -- last fill not listed --last ov 10.4.13 fmla forms

## 2011-12-20 ENCOUNTER — Ambulatory Visit (INDEPENDENT_AMBULATORY_CARE_PROVIDER_SITE_OTHER): Payer: PRIVATE HEALTH INSURANCE | Admitting: Cardiovascular Disease

## 2011-12-20 ENCOUNTER — Ambulatory Visit (INDEPENDENT_AMBULATORY_CARE_PROVIDER_SITE_OTHER): Payer: PRIVATE HEALTH INSURANCE | Admitting: Pharmacist

## 2011-12-20 VITALS — BP 130/80 | HR 70 | Wt 138.0 lb

## 2011-12-20 DIAGNOSIS — I10 Essential (primary) hypertension: Secondary | ICD-10-CM

## 2011-12-20 DIAGNOSIS — I4891 Unspecified atrial fibrillation: Secondary | ICD-10-CM

## 2011-12-20 DIAGNOSIS — E785 Hyperlipidemia, unspecified: Secondary | ICD-10-CM

## 2011-12-20 DIAGNOSIS — I509 Heart failure, unspecified: Secondary | ICD-10-CM

## 2011-12-20 NOTE — Assessment & Plan Note (Signed)
Doing well Responded nicely to diuretic and revatio.  Not a candidiate for further aggressive Rx given age and dementia

## 2011-12-20 NOTE — Assessment & Plan Note (Signed)
Well controlled.  Continue current medications and low sodium Dash type diet.    

## 2011-12-20 NOTE — Patient Instructions (Signed)
Your physician wants you to follow-up in:  6 MONTHS WITH DR NISHAN  You will receive a reminder letter in the mail two months in advance. If you don't receive a letter, please call our office to schedule the follow-up appointment. Your physician recommends that you continue on your current medications as directed. Please refer to the Current Medication list given to you today. 

## 2011-12-20 NOTE — Assessment & Plan Note (Signed)
Seems to be in NSR today  NO anticoagulation given frailty and age

## 2011-12-20 NOTE — Progress Notes (Signed)
Patient ID: Sara Carlson, female   DOB: 1922/12/10, 78 y.o.   MRN: [id number] Sara Carlson is seen today for f/U of afib, anticoagulation, MR and pulmonary hypertension. Despite her age she has been a reasonable coumadin candidate still living indep. and with no falls. Her INR's have bee Rx. She has had mild to moderate MR in the past but elevated PA pressures by echo disproportionately high at mid 80s. She has had lower extremity edema likely from pulmonary hypertension and has responded well to Lasix and aldactone. She is at assisted living [name] and [name] takes good care of her. She loves her Snickers candy but has to limit it because of gout.   Started on revatio last visit and seems to be tolerating and doing well. Weight up 4lbs as she was too dry last visit and she feels stronger  Echo 05/04/11 Study Conclusions  - Left ventricle: Inferior wall hypokinesis The cavity size was mildly dilated. The estimated ejection fraction was 40%. - Mitral valve: Moderate regurgitation. - Left atrium: The atrium was moderately dilated. - Right ventricle: The cavity size was mildly dilated. - Right atrium: The atrium was severely dilated. - Atrial septum: No defect or patent foramen ovale was identified. - Tricuspid valve: Severe regurgitation. - Pulmonary arteries: PA peak pressure: 65mm Hg (S). - Impressions: Severe TR with signs of pulmonary hypetension Impressions:    ROS: Denies fever, malais, weight loss, blurry vision, decreased visual acuity, cough, sputum, SOB, hemoptysis, pleuritic pain, palpitaitons, heartburn, abdominal pain, melena, lower extremity edema, claudication, or rash.  All other systems reviewed and negative  General: Affect appropriate Healthy:  appears stated age HEENT: normal Neck supple with no adenopathy JVP elevated l no bruits no thyromegaly Lungs clear with no wheezing and good diaphragmatic motion Heart:  S1/S2 no murmur, no rub, gallop or  click PMI normal Abdomen: benighn, BS positve, no tenderness, no AAA no bruit.  No HSM or HJR Distal pulses intact with no bruits Plus one bilateral  edema Neuro non-focal Skin warm and dry No muscular weakness   Current Outpatient Prescriptions  Medication Sig Dispense Refill  . CALCIUM-VITAMIN D PO Take 1 tablet by mouth daily.       . carvedilol (COREG) 6.25 MG tablet Take 1 tablet (6.25 mg total) by mouth 2 (two) times daily.  60 tablet  11  . COUMADIN 2.5 MG tablet USE AS DIRECTED BY ANTICOAGULATION CLINIC.  50 tablet  3  . DIOVAN 160 MG tablet take 1 tablet by mouth once daily  90 tablet  2  . donepezil (ARICEPT) 10 MG tablet take 1 tablet by mouth once daily  30 tablet  2  . doxazosin (CARDURA) 8 MG tablet take 1 tablet by mouth at bedtime  90 tablet  1  . febuxostat (ULORIC) 40 MG tablet Take 1 tablet (40 mg total) by mouth daily.  30 tablet  6  . furosemide (LASIX) 40 MG tablet Take 1 tablet (40 mg total) by mouth daily.  90 tablet  1  . KLOR-CON M20 20 MEQ tablet take 2 tablets by mouth TONIGHT THEN AS DIRECTED BY DR Alden Feagan  60 tablet  5  . loratadine (CLARITIN) 5 MG chewable tablet Chew 5 mg by mouth daily.        . metolazone (ZAROXOLYN) 2.5 MG tablet Take 1 tablet (2.5 mg total) by mouth daily.  30 tablet  6  . NAMENDA 5 MG tablet take 1 tablet by mouth twice a day  180 tablet  2  . Polyethyl Glycol-Propyl Glycol (SYSTANE) 0.4-0.3 % SOLN Apply 1 drop to eye daily.       . sildenafil (REVATIO) 20 MG tablet Take 1 tablet (20 mg total) by mouth 3 (three) times daily.  90 tablet  6  . spironolactone (ALDACTONE) 25 MG tablet take 1 tablet by mouth once daily  90 tablet  2    Allergies  Review of patient's allergies indicates no known allergies.  Electrocardiogram:  Assessment and Plan

## 2011-12-20 NOTE — Assessment & Plan Note (Signed)
Cholesterol is at goal.  Continue current dose of statin and diet Rx.  No myalgias or side effects.  F/U  LFT's in 6 months. Lab Results  Component Value Date   LDLCALC 82 08/23/2010             

## 2011-12-25 ENCOUNTER — Telehealth: Payer: Self-pay | Admitting: Internal Medicine

## 2011-12-25 DIAGNOSIS — N189 Chronic kidney disease, unspecified: Secondary | ICD-10-CM

## 2011-12-25 NOTE — Telephone Encounter (Signed)
Please call pt's daughter, needs BMP dx CRI

## 2011-12-27 IMAGING — CT CT HEAD W/O CM
1 series · 16 of 30 positions shown, 20 images · non-contrast
Comparison: 09/21/2007

CLINICAL DATA: Headache, dizziness and weakness.

CT HEAD WITHOUT CONTRAST
TECHNIQUE: Contiguous axial images were obtained from the base of
the skull through the vertex without contrast.

[Series 2: head_seq 4.5 h37s st · axial · 0.43mm/px · z∈[-167,-41]mm · 16 of 32 slices shown, 20 images]
[im 2/32  brain]
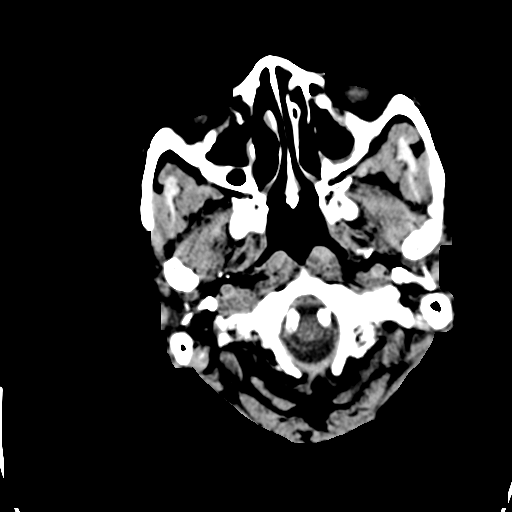
[im 2/32  bone]
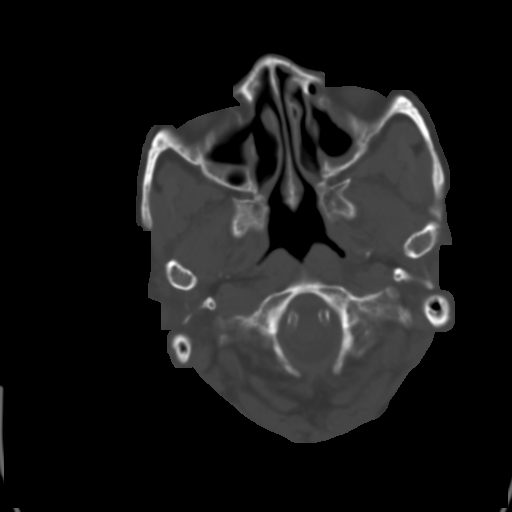
[im 4/32  brain]
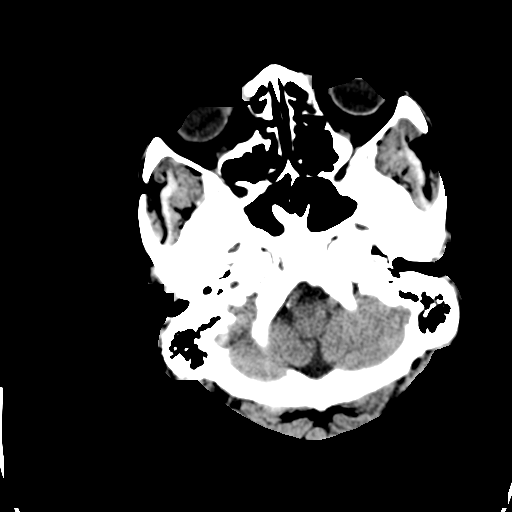
[im 6/32  brain]
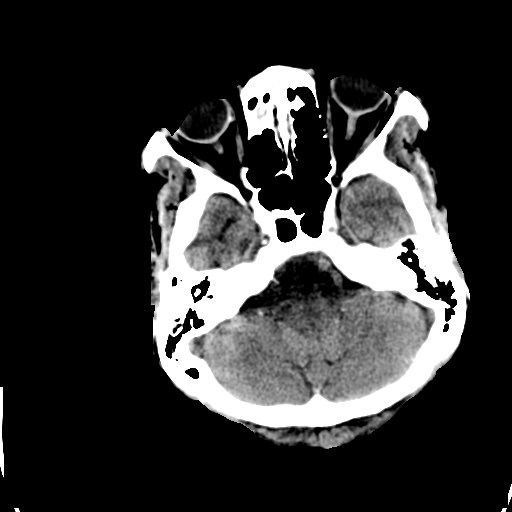
[im 8/32  brain]
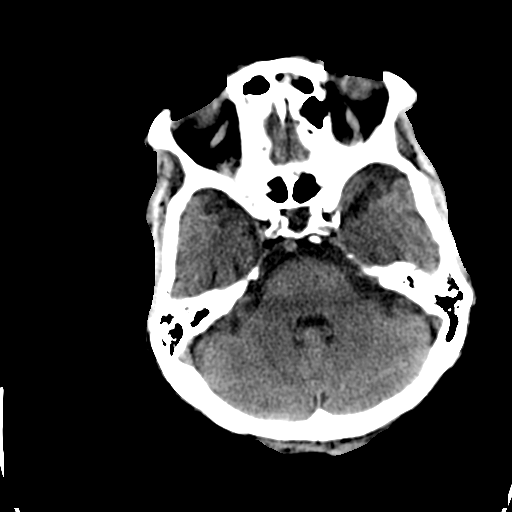
[im 9/32  brain]
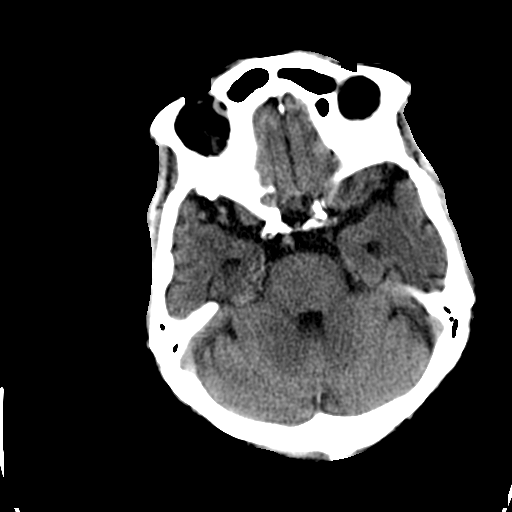
[im 9/32  bone]
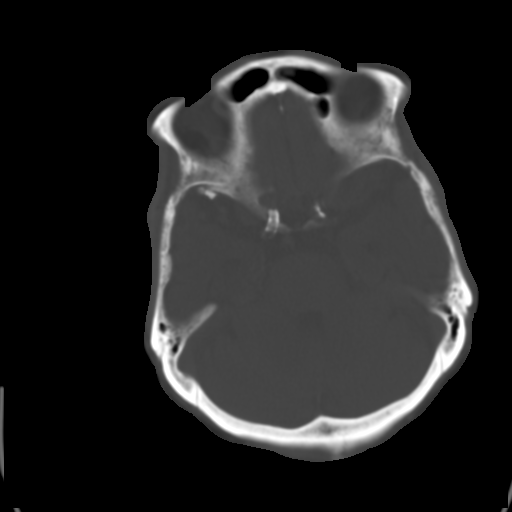
[im 11/32  brain]
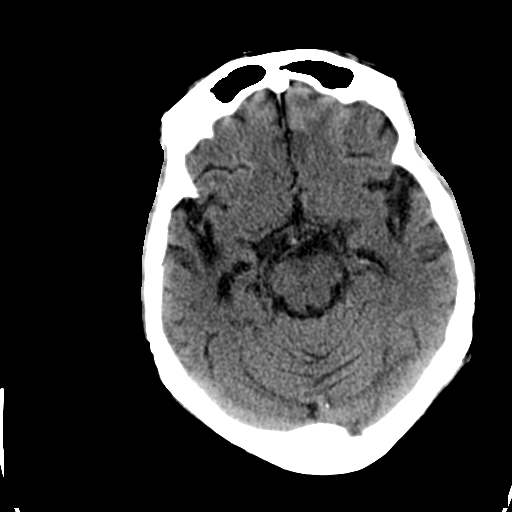
[im 13/32  brain]
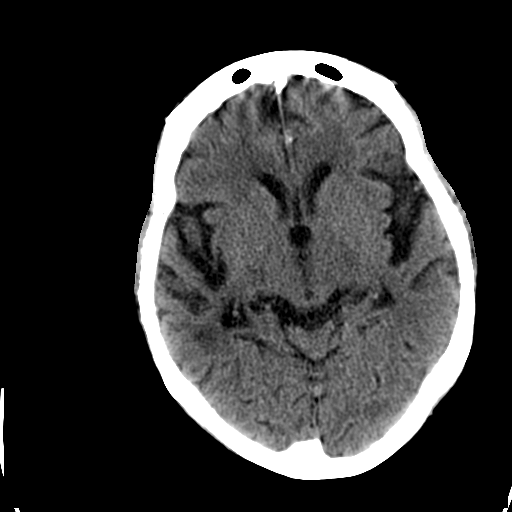
[im 15/32  brain]
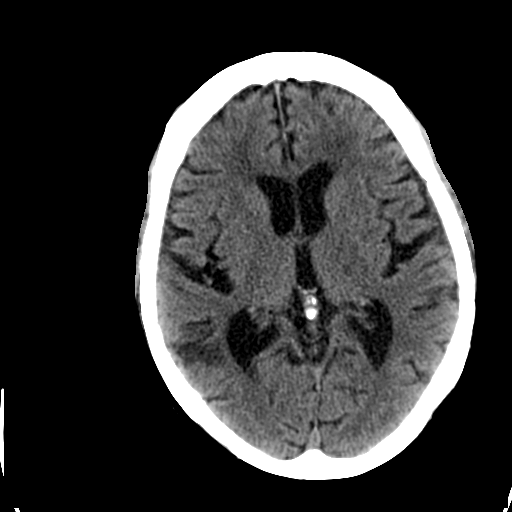
[im 17/32  brain]
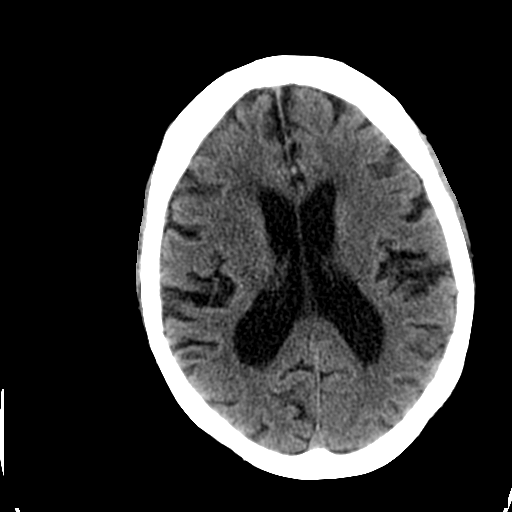
[im 17/32  bone]
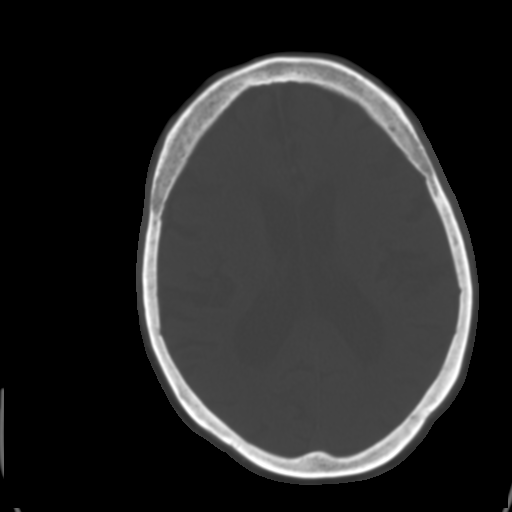
[im 19/32  brain]
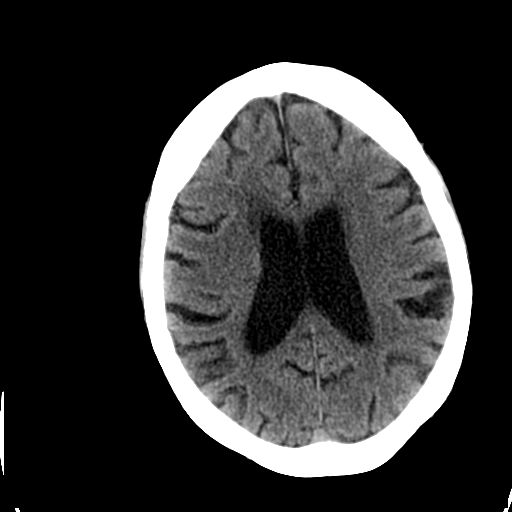
[im 21/32  brain]
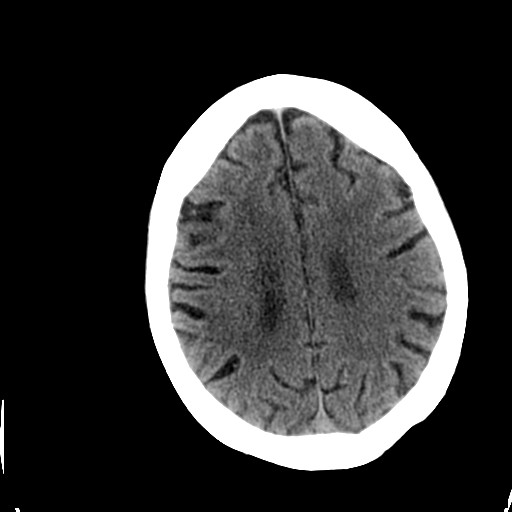
[im 23/32  brain]
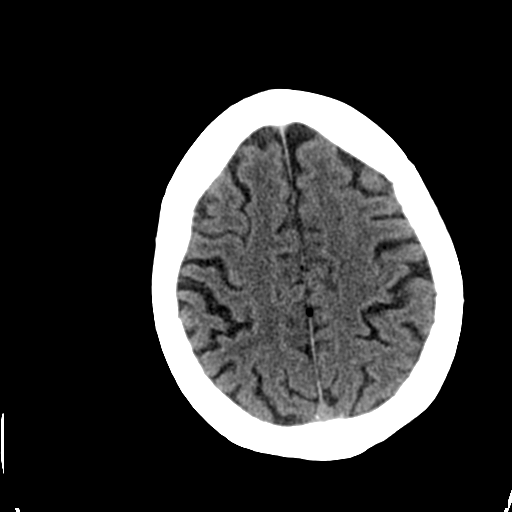
[im 24/32  brain]
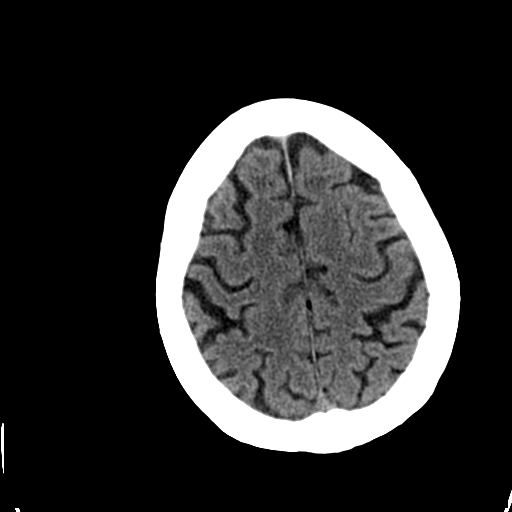
[im 24/32  bone]
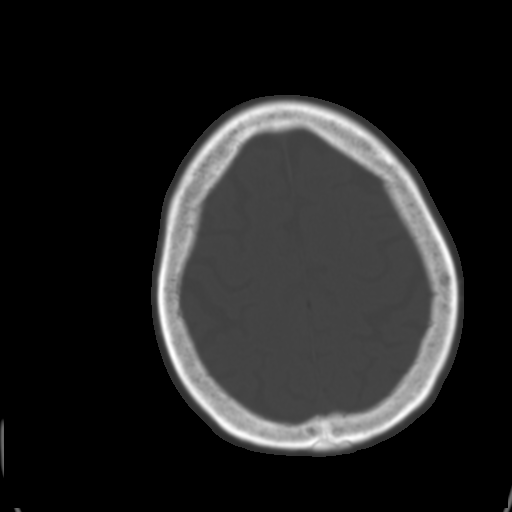
[im 26/32  brain]
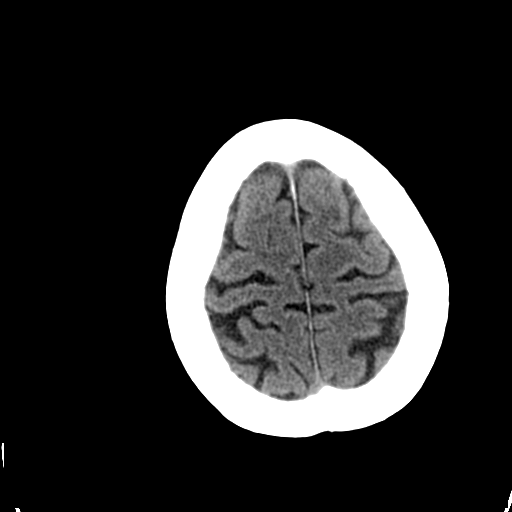
[im 28/32  brain]
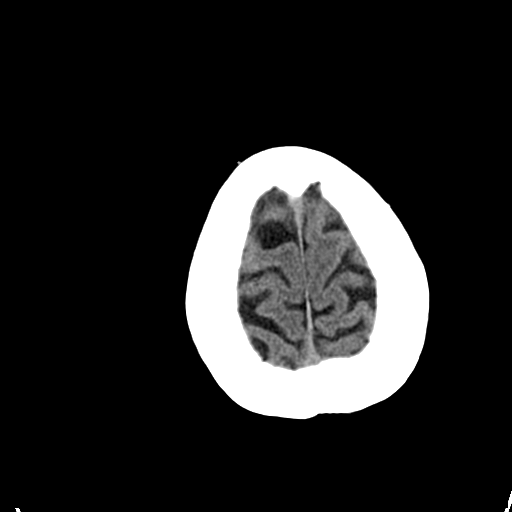
[im 30/32  brain]
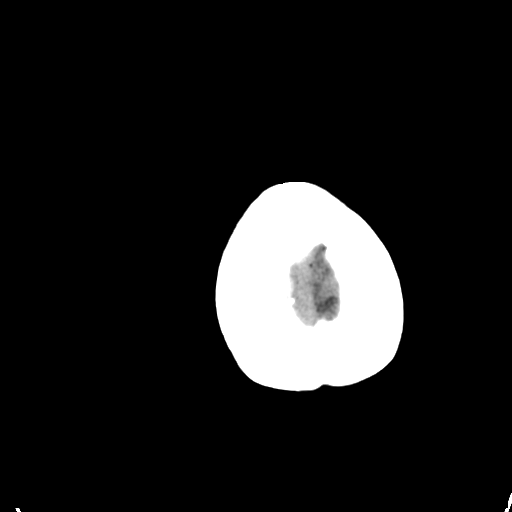

[16 of 30 positions shown; findings below may reference images not displayed]

FINDINGS: Stable mild chronic small vessel disease.  No evidence of
acute hemorrhage, mass effect, mass lesion, abnormal edema,
infarction, hydrocephalus or extra-axial fluid.  Chronic maxillary
sinusitis extending into a few ethmoid air cells and the left
nasoethmoid region.  The skull is unremarkable.
IMPRESSION: No acute findings.  Stable chronic small vessel disease and
sinusitis.

## 2011-12-27 NOTE — Telephone Encounter (Signed)
Discussed with pt's daughter. Orders entered.

## 2011-12-29 ENCOUNTER — Other Ambulatory Visit (INDEPENDENT_AMBULATORY_CARE_PROVIDER_SITE_OTHER): Payer: PRIVATE HEALTH INSURANCE

## 2011-12-29 DIAGNOSIS — N189 Chronic kidney disease, unspecified: Secondary | ICD-10-CM

## 2011-12-30 ENCOUNTER — Encounter: Payer: Self-pay | Admitting: *Deleted

## 2011-12-30 LAB — BASIC METABOLIC PANEL
BUN: 60 mg/dL — ABNORMAL HIGH (ref 6–23)
CO2: 23 mEq/L (ref 19–32)
Chloride: 113 mEq/L — ABNORMAL HIGH (ref 96–112)
Creatinine, Ser: 1.9 mg/dL — ABNORMAL HIGH (ref 0.4–1.2)
Glucose, Bld: 115 mg/dL — ABNORMAL HIGH (ref 70–99)
Potassium: 5.2 mEq/L — ABNORMAL HIGH (ref 3.5–5.1)

## 2012-01-10 ENCOUNTER — Telehealth: Payer: Self-pay | Admitting: *Deleted

## 2012-01-10 NOTE — Telephone Encounter (Signed)
[facility] called & stated that they have reviewed the pt & they would like for her to stop taking ARB if she hasn't already. Please advise.

## 2012-01-10 NOTE — Telephone Encounter (Signed)
Discussed with pt's daughter

## 2012-01-10 NOTE — Telephone Encounter (Signed)
Advise patient, stop taking Diovan until she sees nephrology, monitor her BP, if it goes over 140/85, let me know

## 2012-01-17 ENCOUNTER — Ambulatory Visit (INDEPENDENT_AMBULATORY_CARE_PROVIDER_SITE_OTHER): Payer: PRIVATE HEALTH INSURANCE | Admitting: *Deleted

## 2012-01-17 DIAGNOSIS — I4891 Unspecified atrial fibrillation: Secondary | ICD-10-CM

## 2012-01-26 ENCOUNTER — Other Ambulatory Visit: Payer: Self-pay | Admitting: *Deleted

## 2012-01-26 MED ORDER — METOLAZONE 2.5 MG PO TABS: 2.5000 mg | ORAL_TABLET | Freq: Every day | ORAL | Status: DC

## 2012-01-30 ENCOUNTER — Other Ambulatory Visit: Payer: Self-pay | Admitting: Internal Medicine

## 2012-01-30 NOTE — Telephone Encounter (Signed)
Refill done.  

## 2012-02-06 ENCOUNTER — Ambulatory Visit (INDEPENDENT_AMBULATORY_CARE_PROVIDER_SITE_OTHER): Payer: PRIVATE HEALTH INSURANCE | Admitting: *Deleted

## 2012-02-06 DIAGNOSIS — I4891 Unspecified atrial fibrillation: Secondary | ICD-10-CM

## 2012-02-11 ENCOUNTER — Other Ambulatory Visit: Payer: Self-pay | Admitting: Internal Medicine

## 2012-02-13 ENCOUNTER — Other Ambulatory Visit: Payer: Self-pay

## 2012-02-13 MED ORDER — WARFARIN SODIUM 2.5 MG PO TABS: ORAL_TABLET | ORAL | Status: DC

## 2012-02-27 ENCOUNTER — Ambulatory Visit (INDEPENDENT_AMBULATORY_CARE_PROVIDER_SITE_OTHER): Payer: PRIVATE HEALTH INSURANCE | Admitting: Internal Medicine

## 2012-02-27 VITALS — BP 138/74 | HR 81 | Temp 98.1°F | Wt 130.0 lb

## 2012-02-27 DIAGNOSIS — F0391 Unspecified dementia with behavioral disturbance: Secondary | ICD-10-CM

## 2012-02-27 DIAGNOSIS — F0390 Unspecified dementia without behavioral disturbance: Secondary | ICD-10-CM

## 2012-02-27 DIAGNOSIS — N189 Chronic kidney disease, unspecified: Secondary | ICD-10-CM

## 2012-02-27 LAB — POCT URINALYSIS DIPSTICK
Blood, UA: NEGATIVE
Glucose, UA: NEGATIVE
Nitrite, UA: NEGATIVE
Protein, UA: NEGATIVE
Spec Grav, UA: <=1.005
Urobilinogen, UA: 0.2

## 2012-02-27 MED ORDER — QUETIAPINE FUMARATE 25 MG PO TABS: 50.0000 mg | ORAL_TABLET | Freq: Every day | ORAL | Status: DC

## 2012-02-27 NOTE — Progress Notes (Signed)
  Subjective:    Patient ID: Sara Carlson, female    DOB: 1923-02-11, 79 y.o.   MRN: [id number]  HPI Acute visit Here with [name], they brought a list of concerns: Dementia is getting progressively worse, she has the following symptoms: agitation, prophanity use, change in personality, inappropriate behavior in front of the family. Moody and withdrawn at times, distrust of people. She wonders at night in [place]. Not eating well.  I asked the patient how she's doing, she said that she is doing okay. Admits,  as usual,  that she has some anxiety and depression but no suicidal ideas. Denies any dysuria or gross hematuria. Having bowel movements daily.  Then I asked her daughter, Shayden has no been complaining about any nausea, vomiting, abdominal pain or urinary symptoms. No known falls or head injury. No fever or chills Mild cough for the last few days.   Past Medical History:   Congestive heart failure   A. Fib, paroxismal   Hyperlipidemia   Hypertension   anxiety-depression   dementia   ALLERGIC RHINITIS   Gout   Past Surgical History:   Hysterectomy, no oophorectomy   Family History:   colon ca--no   breast ca--no   DM--no   CAD--no   Social History:   Moved from [place] to [place] Feb. 2008 ; 1 son ([place]) , 1 daughter ([place]) lives in her appartment "independent living"   ADLs: independent, see HPI   doesn't drive   Daughter Sara Carlson is a friend of Dr. Eden Emms and works at [employer] day care.   no tobacco   ETOH-- wine sometimes    Review of Systems As far as hypertension, they discontinued Diovan as instructed , BP remains okay.    Objective:   Physical Exam General -- alert, well-developed, and well-nourished.     Lungs -- normal respiratory effort, no intercostal retractions, no accessory muscle use, and normal breath sounds.    Heart-- irregularly irregular rate, has a systolic murmur   Psych--   not anxious appearing and not depressed appearing.       Assessment & Plan:  Today , I spent more than 28  min with the patient, >50% of the time counseling the patient and her daughter

## 2012-02-27 NOTE — Assessment & Plan Note (Addendum)
Creatinine was noted to be > baseline  10- 2013, Diovan  was discontinued, Lasix dose decreased. Followup BMP showed slightly better creatinine. BP remains well controlled. It was referred to nephrology but that never happened. Ultrasound  11-2011 showed medical renal disease, no obstruction Plan: BMP Consider referred to nephrology.

## 2012-02-27 NOTE — Patient Instructions (Addendum)
Start taking Seroquel one tablet at bedtime, after 10 days take 2 tablets at bedtime. Please come back in 6 weeks

## 2012-02-27 NOTE — Assessment & Plan Note (Addendum)
Dementia with last MMSE 12 (11-2011) , now with behavioral issues including wandering, agitation. No history of recent head injury, review of systems negative for constipation, UTI. Not on anticholinergics. We discussed treatment options including: Neurology referral, Seroquel, "The 36 hour day" for the caregiver, brain imaging. Eventually we agree on : Will check a UA to rule out a UTI. low dose of Seroquel  Family we'll also have to consider more appropriate care setting, she is currently living by herself.

## 2012-02-28 ENCOUNTER — Encounter: Payer: Self-pay | Admitting: Internal Medicine

## 2012-02-28 LAB — BASIC METABOLIC PANEL
Calcium: 9.4 mg/dL (ref 8.4–10.5)
GFR: 21.87 mL/min — ABNORMAL LOW (ref >60.00)
Potassium: 5.1 mEq/L (ref 3.5–5.1)
Sodium: 139 mEq/L (ref 135–145)

## 2012-02-28 LAB — URINE CULTURE
Colony Count: NO GROWTH
Organism ID, Bacteria: NO GROWTH

## 2012-02-28 LAB — TSH: TSH: 1.24 u[IU]/mL (ref 0.35–5.50)

## 2012-02-29 ENCOUNTER — Telehealth: Payer: Self-pay | Admitting: *Deleted

## 2012-02-29 NOTE — Telephone Encounter (Signed)
Left detailed msg on vmail.  

## 2012-02-29 NOTE — Telephone Encounter (Signed)
Pt daughter called back wanting to know if Pt still needs to continue with other med (aricept, namenda). Please advise    Left message to call office

## 2012-02-29 NOTE — Telephone Encounter (Signed)
Yes, needs to continue  taking Aricept and Namenda

## 2012-03-01 NOTE — Addendum Note (Signed)
Addended by: Edwena Felty T on: 03/01/2012 12:16 PM   Modules accepted: Orders

## 2012-03-05 ENCOUNTER — Ambulatory Visit (INDEPENDENT_AMBULATORY_CARE_PROVIDER_SITE_OTHER): Payer: PRIVATE HEALTH INSURANCE | Admitting: *Deleted

## 2012-03-05 DIAGNOSIS — I4891 Unspecified atrial fibrillation: Secondary | ICD-10-CM

## 2012-03-06 ENCOUNTER — Other Ambulatory Visit: Payer: Self-pay | Admitting: Internal Medicine

## 2012-03-25 ENCOUNTER — Other Ambulatory Visit: Payer: Self-pay | Admitting: Internal Medicine

## 2012-03-26 ENCOUNTER — Ambulatory Visit (INDEPENDENT_AMBULATORY_CARE_PROVIDER_SITE_OTHER): Payer: PRIVATE HEALTH INSURANCE | Admitting: *Deleted

## 2012-03-26 DIAGNOSIS — I4891 Unspecified atrial fibrillation: Secondary | ICD-10-CM

## 2012-03-26 NOTE — Telephone Encounter (Signed)
Spoke with pt's daughter & she stated the pt has been taking this med.  Refill done.

## 2012-03-26 NOTE — Telephone Encounter (Signed)
Please call the patient's daughter clarify: Has she been taking uloric all along? If she has, then is okay to refill.  Please note the uloric is not a "prn" medication for gout

## 2012-03-26 NOTE — Telephone Encounter (Signed)
i do not see on pt's active med list. OK to refill?

## 2012-04-02 ENCOUNTER — Emergency Department (HOSPITAL_COMMUNITY)
Admission: EM | Admit: 2012-04-02 | Discharge: 2012-04-02 | Disposition: A | Discharge status: Home / Self Care | Payer: PRIVATE HEALTH INSURANCE | Source: Home / Self Care

## 2012-04-03 ENCOUNTER — Encounter: Payer: Self-pay | Admitting: Internal Medicine

## 2012-04-03 ENCOUNTER — Ambulatory Visit (INDEPENDENT_AMBULATORY_CARE_PROVIDER_SITE_OTHER): Payer: PRIVATE HEALTH INSURANCE | Admitting: Internal Medicine

## 2012-04-03 VITALS — BP 132/84 | HR 72 | Temp 98.1°F | Wt 131.0 lb

## 2012-04-03 DIAGNOSIS — M109 Gout, unspecified: Secondary | ICD-10-CM

## 2012-04-03 MED ORDER — PREDNISONE 10 MG PO TABS: ORAL_TABLET | ORAL | Status: DC

## 2012-04-03 NOTE — Progress Notes (Signed)
  Subjective:    Patient ID: Sara Carlson, female    DOB: 1922-06-20, 79 y.o.   MRN: [id number]  HPI Acute visit Sudden onset of right knee swelling 2 days ago, denies any injury. Medication list is reviewed, no new medicines added to her list recently. Pt believes symptoms are related to gout. Has moderate pain  Past Medical History:   Congestive heart failure   A. Fib, paroxismal   Hyperlipidemia   Hypertension   anxiety-depression   dementia   ALLERGIC RHINITIS   Gout   Past Surgical History:   Hysterectomy, no oophorectomy   Family History:   colon ca--no   breast ca--no   DM--no   CAD--no   Social History:   Moved from [place] to [place] Feb. 2008 ; 1 son ([place]) , 1 daughter ([place]) lives in her appartment "independent living"   ADLs: independent, see HPI   doesn't drive   Daughter [name] is a friend of Dr. Eden Emms and works at [employer] day care.   no tobacco   ETOH-- wine sometimes    Review of Systems Denies any fever or chills. No actual ankle edema or calf swelling.     Objective:   Physical Exam  General -- alert, well-developed  Extremities--  no pretibial edema bilaterally Both knees with severe DJD deformities. Right knee is swollen, particularly at the medial aspect. popliteal area without mass, only mild swelling. Is hard to say if there is actually an effusion. Knee is not warm or red. Neurologic-- alert , strength normal in all extremities. Psych--   not anxious appearing and not depressed appearing.        Assessment & Plan:

## 2012-04-03 NOTE — Patient Instructions (Addendum)
rest Ice  twice a day Continue with regular medications Prednisone for a few days Call anytime if this gets worse, you have fever, or if you are not improving in the next 3 or 4 days.

## 2012-04-03 NOTE — Assessment & Plan Note (Signed)
Right knee pain, Patient presents with right knee pain and swelling, she is afebrile, most likely is a gout exacerbation or a DJD flare up. We discussed  refer to orthopedic surgery for possibly an aspiration but in the past  she has responded well to prednisone, consequently the plan is: ice, rest, prednisone for a few days. See instructions

## 2012-04-09 ENCOUNTER — Telehealth: Payer: Self-pay | Admitting: Cardiovascular Disease

## 2012-04-09 NOTE — Telephone Encounter (Signed)
Pt's dtr calling re medicare  No longer covering one of her meds and needs to see if it can be changed if she still needs to take it,  revatio

## 2012-04-09 NOTE — Telephone Encounter (Signed)
MEDICARE WILL NO LONGER FILL REVATIO  NEED AND ALTERNATE MED  WILL FORWARD  TO DR Eden Emms FOR REVIEW .Sara Carlson

## 2012-04-09 NOTE — Telephone Encounter (Signed)
Can try to take viagra if they will cover that  25 bid

## 2012-04-10 ENCOUNTER — Telehealth: Payer: Self-pay

## 2012-04-10 NOTE — Telephone Encounter (Signed)
PER PHARMACY  AND INS REPRESENTATIVE  VIAGRA NOT COVERED  WILL TRY AND GET PRIOR AUTH FOR GEN REVATIO  AWAITING FORM  TO BE FAXED  TO OFFICE .Zack Seal

## 2012-04-10 NOTE — Telephone Encounter (Signed)
[facility] was calling to say that  pt was approved for her med till 02 2015- the pharmacist stated a paper will be mailed to [place] with this information

## 2012-04-12 ENCOUNTER — Ambulatory Visit (INDEPENDENT_AMBULATORY_CARE_PROVIDER_SITE_OTHER): Payer: PRIVATE HEALTH INSURANCE

## 2012-04-12 DIAGNOSIS — I4891 Unspecified atrial fibrillation: Secondary | ICD-10-CM

## 2012-04-12 LAB — POCT INR: INR: 2.7

## 2012-04-24 ENCOUNTER — Other Ambulatory Visit: Payer: Self-pay | Admitting: *Deleted

## 2012-04-24 MED ORDER — SILDENAFIL CITRATE 20 MG PO TABS: 20.0000 mg | ORAL_TABLET | Freq: Three times a day (TID) | ORAL | Status: DC

## 2012-04-24 NOTE — Telephone Encounter (Signed)
SEE OTHER PHONE NOTE./CY 

## 2012-04-24 NOTE — Telephone Encounter (Signed)
APPEARS  GEN REVATIO HAS BEEN APPROVED  SEE NOTE  PT'S DAUGHTER AWARE /CY

## 2012-05-03 ENCOUNTER — Ambulatory Visit (INDEPENDENT_AMBULATORY_CARE_PROVIDER_SITE_OTHER): Payer: PRIVATE HEALTH INSURANCE

## 2012-05-03 DIAGNOSIS — I4891 Unspecified atrial fibrillation: Secondary | ICD-10-CM

## 2012-05-05 ENCOUNTER — Other Ambulatory Visit: Payer: Self-pay | Admitting: Internal Medicine

## 2012-05-07 NOTE — Telephone Encounter (Signed)
Refill done.  

## 2012-05-15 ENCOUNTER — Other Ambulatory Visit: Payer: Self-pay | Admitting: Internal Medicine

## 2012-05-15 NOTE — Telephone Encounter (Signed)
Call daughter, med is not listed, has she been taking it all along? Also was referred to nephrology, has she been seen?

## 2012-05-15 NOTE — Telephone Encounter (Signed)
Med not listed on pt's med list. OK to refill?

## 2012-05-16 ENCOUNTER — Telehealth: Payer: Self-pay | Admitting: *Deleted

## 2012-05-16 NOTE — Telephone Encounter (Signed)
Error

## 2012-05-16 NOTE — Telephone Encounter (Signed)
Spoke with pt's daughter. Pt is no longer taking diovan.  Refill denied.

## 2012-05-16 NOTE — Telephone Encounter (Signed)
Left msg for pt's daughter to return call.

## 2012-05-21 ENCOUNTER — Ambulatory Visit (INDEPENDENT_AMBULATORY_CARE_PROVIDER_SITE_OTHER): Payer: PRIVATE HEALTH INSURANCE

## 2012-05-21 DIAGNOSIS — I4891 Unspecified atrial fibrillation: Secondary | ICD-10-CM

## 2012-05-30 ENCOUNTER — Other Ambulatory Visit: Payer: Self-pay | Admitting: Internal Medicine

## 2012-05-30 NOTE — Telephone Encounter (Signed)
Refill done.  

## 2012-06-04 ENCOUNTER — Ambulatory Visit (INDEPENDENT_AMBULATORY_CARE_PROVIDER_SITE_OTHER): Payer: PRIVATE HEALTH INSURANCE | Admitting: Pharmacist

## 2012-06-04 ENCOUNTER — Encounter: Payer: Self-pay | Admitting: Pharmacist

## 2012-06-04 DIAGNOSIS — I4891 Unspecified atrial fibrillation: Secondary | ICD-10-CM

## 2012-06-05 ENCOUNTER — Telehealth: Payer: Self-pay | Admitting: Internal Medicine

## 2012-06-05 NOTE — Telephone Encounter (Signed)
Recommend to increase seroquel from 50 mg each bedtime to 50 mg twice a day
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
## 2012-06-05 NOTE — Telephone Encounter (Signed)
Pt daughter called and stated that her mother is acting out at independent living. Wanted to know what could dr Drue Novel do for her mother is it something in her medicine that is making her act out and if so can we adjust medication or give her something else. Also pt daughter wanted to know about if we had  FL2 forms here? thanks

## 2012-06-05 NOTE — Telephone Encounter (Signed)
Patient's daughter called requesting to speak with Dr. Leta Jungling assistant. She states it is in reference to the patient's health changes and an FL2 form.

## 2012-06-05 NOTE — Telephone Encounter (Signed)
Duplicate phone note.

## 2012-06-05 NOTE — Telephone Encounter (Signed)
Please advise 

## 2012-06-06 NOTE — Telephone Encounter (Signed)
Discussed with pt's daughter

## 2012-06-06 NOTE — Telephone Encounter (Signed)
Pt's daughter returned your call

## 2012-06-08 ENCOUNTER — Telehealth: Payer: Self-pay | Admitting: Internal Medicine

## 2012-06-08 NOTE — Telephone Encounter (Signed)
Patient's daughter states the patient needs an FL2 form completed. She would like to know if pt needs an OV or if she can drop it off to be completed.

## 2012-06-08 NOTE — Telephone Encounter (Signed)
Please advise 

## 2012-06-08 NOTE — Telephone Encounter (Signed)
Spoke to pt's daughter, she said she will drop the form off.

## 2012-06-08 NOTE — Telephone Encounter (Signed)
i have seen her within 3 months, thus ok to drop the form, we may need to call her and get some info

## 2012-06-26 ENCOUNTER — Ambulatory Visit (INDEPENDENT_AMBULATORY_CARE_PROVIDER_SITE_OTHER): Payer: PRIVATE HEALTH INSURANCE | Admitting: Internal Medicine

## 2012-06-26 VITALS — BP 126/82 | HR 79 | Temp 98.8°F | Wt 134.0 lb

## 2012-06-26 DIAGNOSIS — N189 Chronic kidney disease, unspecified: Secondary | ICD-10-CM

## 2012-06-26 DIAGNOSIS — I10 Essential (primary) hypertension: Secondary | ICD-10-CM

## 2012-06-26 DIAGNOSIS — M109 Gout, unspecified: Secondary | ICD-10-CM

## 2012-06-26 DIAGNOSIS — F22 Delusional disorders: Secondary | ICD-10-CM

## 2012-06-26 DIAGNOSIS — F0391 Unspecified dementia with behavioral disturbance: Secondary | ICD-10-CM

## 2012-06-26 LAB — CBC WITH DIFFERENTIAL/PLATELET
Basophils Relative: 0.7 % (ref 0.0–3.0)
Eosinophils Absolute: 0.1 10*3/uL (ref 0.0–0.7)
Eosinophils Relative: 2.3 % (ref 0.0–5.0)
HCT: 33.7 % — ABNORMAL LOW (ref 36.0–46.0)
Lymphs Abs: 1.7 10*3/uL (ref 0.7–4.0)
MCHC: 33.4 g/dL (ref 30.0–36.0)
MCV: 94.5 fl (ref 78.0–100.0)
Monocytes Absolute: 0.8 10*3/uL (ref 0.1–1.0)
Neutrophils Relative %: 52.2 % (ref 43.0–77.0)
Platelets: 229 10*3/uL (ref 150.0–400.0)
RBC: 3.56 Mil/uL — ABNORMAL LOW (ref 3.87–5.11)
WBC: 5.5 10*3/uL (ref 4.5–10.5)

## 2012-06-26 LAB — COMPREHENSIVE METABOLIC PANEL
AST: 21 U/L (ref 0–37)
Albumin: 3.9 g/dL (ref 3.5–5.2)
Alkaline Phosphatase: 64 U/L (ref 39–117)
Glucose, Bld: 84 mg/dL (ref 70–99)
Potassium: 4.7 mEq/L (ref 3.5–5.1)
Sodium: 144 mEq/L (ref 135–145)
Total Bilirubin: 0.6 mg/dL (ref 0.3–1.2)
Total Protein: 7.2 g/dL (ref 6.0–8.3)

## 2012-06-26 NOTE — Assessment & Plan Note (Signed)
Well-controlled, no change 

## 2012-06-26 NOTE — Assessment & Plan Note (Addendum)
See history of present illness, because recent behavior issues we increased the Seroquel but she is more confused. Urinalysis showed no infection, review of systems is negative for pain or constipation. Discuss the issue with patient's daughter, We agreed to decrease Seroquel back to 25 mg each bedtime. If behavioral issues continue, we'll have to try a different direction; Other medications are available, most of them would increase her risk of falls and somnolence. [name] aware.  Avoid daily Claritin which is in her med list, she takes it very seldom. Labs to rule out hepatic disease or worsening kidney function. Other options: ? memory unit. ?referral to Dr. Donell Beers, a geriatric psychiatrist. ? discontinue Namenda or Aricept to decrease confusion.
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
## 2012-06-26 NOTE — Assessment & Plan Note (Signed)
Was referred to nephrology, declined to go. Check a CMP

## 2012-06-26 NOTE — Assessment & Plan Note (Signed)
Checked a uric acid

## 2012-06-27 ENCOUNTER — Encounter: Payer: Self-pay | Admitting: Internal Medicine

## 2012-06-29 ENCOUNTER — Ambulatory Visit (INDEPENDENT_AMBULATORY_CARE_PROVIDER_SITE_OTHER): Payer: PRIVATE HEALTH INSURANCE | Admitting: Pharmacist

## 2012-06-29 DIAGNOSIS — I4891 Unspecified atrial fibrillation: Secondary | ICD-10-CM

## 2012-06-29 LAB — POCT INR: INR: 1.8

## 2012-07-16 ENCOUNTER — Ambulatory Visit (INDEPENDENT_AMBULATORY_CARE_PROVIDER_SITE_OTHER): Payer: PRIVATE HEALTH INSURANCE | Admitting: Pharmacist

## 2012-07-16 DIAGNOSIS — I4891 Unspecified atrial fibrillation: Secondary | ICD-10-CM

## 2012-07-23 ENCOUNTER — Telehealth: Payer: Self-pay | Admitting: Internal Medicine

## 2012-07-23 DIAGNOSIS — F0391 Unspecified dementia with behavioral disturbance: Secondary | ICD-10-CM

## 2012-07-23 NOTE — Telephone Encounter (Signed)
Discuss with patient daughter 

## 2012-07-23 NOTE — Telephone Encounter (Signed)
Left Pt daughter detail VM that referral had been placed and that someone will be contacting her with appt info. Pt daughter to return call with any additional question or concerns.

## 2012-07-23 NOTE — Telephone Encounter (Signed)
I am not opposed, please arrange the referral to neurology, DX dementia. Send my last 3 office visit notes

## 2012-07-23 NOTE — Telephone Encounter (Signed)
Patient's daughter states the nurse from [facility] has advised that pt see a neurologist. She would like a referral.

## 2012-08-10 ENCOUNTER — Ambulatory Visit (INDEPENDENT_AMBULATORY_CARE_PROVIDER_SITE_OTHER): Payer: PRIVATE HEALTH INSURANCE | Admitting: *Deleted

## 2012-08-10 DIAGNOSIS — I4891 Unspecified atrial fibrillation: Secondary | ICD-10-CM

## 2012-08-13 ENCOUNTER — Telehealth: Payer: Self-pay | Admitting: *Deleted

## 2012-08-13 MED ORDER — WARFARIN SODIUM 2.5 MG PO TABS: ORAL_TABLET | ORAL | Status: DC

## 2012-08-13 NOTE — Telephone Encounter (Signed)
Pt needs a refill on coumadin

## 2012-08-28 ENCOUNTER — Ambulatory Visit (INDEPENDENT_AMBULATORY_CARE_PROVIDER_SITE_OTHER): Payer: PRIVATE HEALTH INSURANCE | Admitting: Neurology

## 2012-08-28 ENCOUNTER — Encounter: Payer: Self-pay | Admitting: Neurology

## 2012-08-28 VITALS — BP 158/78 | HR 70 | Temp 97.9°F | Wt 137.0 lb

## 2012-08-28 DIAGNOSIS — F0391 Unspecified dementia with behavioral disturbance: Secondary | ICD-10-CM

## 2012-08-28 NOTE — Patient Instructions (Addendum)
1.  We will continue the Namenda for now, but we will stop the Aricept. 2.  Continue the seroquel at the current dose. 3.  Important to exercise.  Take regular walks (with your cane). 4.  You need to exercise your mind as well.  I recommend getting a book of brain teasers. 5.  Stay social and engage with people.  Follow up in 3 months.

## 2012-08-28 NOTE — Progress Notes (Addendum)
NEUROLOGY CONSULTATION NOTE  Sara Carlson MRN: [id number] DOB: 12-31-1922  Referring provider: Dr. Drue Novel Primary care provider: Dr. Drue Novel  Reason for consult:  Dementia  HISTORY OF PRESENT ILLNESS: Sara Carlson is a 79 y.o. female with history of paroxysmal a-fib, CHF, hypertension and depression who presents for evaluation of worsening confusion in a patient with known dementia.  She is accompanied by her daughter.  History mostly obtained by her daughter as patient is a poor historian.  She has had problems with memory for several years. Her daughter first noticed forgetfulness about 4 years ago. She would misplace objects repeatedly, such as her purse. She went off in repeat questions over again. She was started on Aricept and later Namenda. Since this time, she's had a progression of symptoms. She does have delusions, such as thinking it is currently the past, or that her car was stolen. Sometimes she will get her granddaughters mixed up, but doesn't really have any problems recognizing close friends or family. She move to [place] from [place] approximately 6 years ago. She is not happy living and [place] and wishes to move back to [place]. However, she does not have any significant depression.  She did not experience any hallucinations. A couple of months ago, she was experiencing episodes of confusion and agitation. She reportedly did not have evidence of UTI.  Dr. Drue Novel reduced her dose of Seroquel from 50 mg to 25 mg. He also discontinued Claritin, although she hadn't been using it much. Since that time, there has been improvement.  She does live by herself. On a couple of occasions, while walking in the hallway of her building, she couldn't find her apartment door since they all look the same. She has left the stove on and on at least one occasion.  She does not drive. She is able to perform her chores and keep her home reasonably neat.  Overall, her appetite seems fairly good. She is  able to dress and clean herself. She does talk to friends and family regularly over the phone. She doesn't take regular walks.  She does not have any tremor or abnormal movements.  She does not have transient alteration in alertness.  Labs and images personally reviewed: CT Brain (10/14/10): chronic small vessel ischemic changes but no acute abnormalities  CBC (06/26/12): WBC 5.5, Hgb 11.3, Hct 33.7, PLT 229 BMP (06/26/12): Na 144, K 4.7, Cl 109, CO2 27, BUN 40, Cr 1.6, Gluc 84 TSH (02/27/12): 1.24 B12 (07/27/11): 536 Folate (07/27/11): 6.0  PAST MEDICAL HISTORY: Past Medical History  Diagnosis Date  . CHF (congestive heart failure)   . Atrial fibrillation     paroxismal  . Hyperlipidemia   . Hypertension   . Anxiety and depression   . Dementia   . Allergic rhinitis   . Gout     PAST SURGICAL HISTORY: Past Surgical History  Procedure Laterality Date  . Abdominal hysterectomy      no oophorectomy    MEDICATIONS: Current Outpatient Prescriptions on File Prior to Visit  Medication Sig Dispense Refill  . CALCIUM-VITAMIN D PO Take 1 tablet by mouth daily.       . carvedilol (COREG) 6.25 MG tablet take 1 tablet by mouth twice a day  60 tablet  11  . donepezil (ARICEPT) 10 MG tablet take 1 tablet by mouth once daily  30 tablet  5  . doxazosin (CARDURA) 8 MG tablet take 1 tablet by mouth at bedtime  90 tablet  1  .  FIBER FORMULA PO Take by mouth as needed.      . furosemide (LASIX) 40 MG tablet Take 1 tablet (40 mg total) by mouth daily.  90 tablet  1  . loratadine (CLARITIN) 5 MG chewable tablet Chew 5 mg by mouth daily.        . metolazone (ZAROXOLYN) 2.5 MG tablet Take 1 tablet (2.5 mg total) by mouth daily.  30 tablet  6  . QUEtiapine (SEROQUEL) 25 MG tablet Take 25 mg by mouth at bedtime.      . sildenafil (REVATIO) 20 MG tablet Take 1 tablet (20 mg total) by mouth 3 (three) times daily.  90 tablet  11  . spironolactone (ALDACTONE) 25 MG tablet       . ULORIC 40 MG tablet take 1  tablet by mouth once daily  30 tablet  6  . warfarin (COUMADIN) 2.5 MG tablet Take as directed by anticoagulation clinic  60 tablet  3   No current facility-administered medications on file prior to visit.    ALLERGIES: No Known Allergies  FAMILY HISTORY: Family History  Problem Relation Age of Onset  . Colon cancer Neg Hx   . Breast cancer Neg Hx   . Diabetes Neg Hx   . Coronary artery disease Neg Hx     SOCIAL HISTORY: History   Social History  . Marital Status: Single    Spouse Name: N/A    Number of Children: N/A  . Years of Education: N/A   Occupational History  . Not on file.   Social History Main Topics  . Smoking status: Never Smoker   . Smokeless tobacco: Never Used  . Alcohol Use: 0.0 oz/week     Comment: occasionally  . Drug Use: No  . Sexually Active: Not on file   Other Topics Concern  . Not on file   Social History Narrative   Moved from [place] to [place] feb 2008   Lives in her apartment "independent living"   Doesn't drive   Daughter [name] is a friend of Dr.Nishan and works at [employer]          REVIEW OF SYSTEMS: Constitutional: No fevers, chills, or sweats, no generalized fatigue, change in appetite Eyes: No visual changes, double vision, eye pain Ear, nose and throat: No hearing loss, ear pain, nasal congestion, sore throat Cardiovascular: No chest pain, palpitations Respiratory:  No shortness of breath at rest or with exertion, wheezes GastrointestinaI: No nausea, vomiting, diarrhea, abdominal pain, fecal incontinence Genitourinary:  No dysuria, urinary retention or frequency Musculoskeletal:  No neck pain, back pain Integumentary: No rash, pruritus, skin lesions Neurological: as above Psychiatric: No depression, insomnia, anxiety Endocrine: No palpitations, fatigue, diaphoresis, mood swings, change in appetite, change in weight, increased thirst Hematologic/Lymphatic:  No anemia, purpura, petechiae. Allergic/Immunologic: no  itchy/runny eyes, nasal congestion, recent allergic reactions, rashes  PHYSICAL EXAM: Filed Vitals:   08/28/12 1009  BP: 158/78  Pulse: 70  Temp: 97.9 F (36.6 C)   General: No acute distress Head:  Normocephalic/atraumatic Neck: supple, no paraspinal tenderness, full range of motion Back: No paraspinal tenderness Heart: irregular rhythm Lungs: Clear to auscultation bilaterally. Vascular: No carotid bruits. Neurological Exam: Mental status: alert and oriented to person, city and state but not time.  Speech fluent and not dysarthric.  Able to name, repeat, write and read.  Unable to spell WORLD backwards, unable to recall 3 words after a couple of minutes, difficulty copying intersecting pentagons correctly, struggled and misplaced placing  the numbers correctly in a clock.   MMSE 12/30. Cranial nerves: CN I: not tested CN II: pupils equal, round and reactive to light, visual fields intact, fundi unremarkable. CN III, IV, VI:  full range of motion, no nystagmus, no ptosis CN V: facial sensation intact CN VII: upper and lower face symmetric CN VIII: hearing intact CN IX, X: gag intact, uvula midline CN XI: sternocleidomastoid and trapezius muscles intact CN XII: tongue midline Bulk & Tone: normal, no fasciculations. Motor: 5/5 throughout Sensation: temperature and vibration intacft Deep Tendon Reflexes: 1+ throughout except absent in ankles Finger to nose testing: without dysmetria Gait: mildly wide-based gait but good stride, able to turn around. Romberg negative.  IMPRESSION & PLAN: LEANE LORING is a 79 y.o. female with dementia, affecting both memory and visuospatial/executive functioning.  Likely primarily amnestic-type (Alzheimer's dementia), given that initial symptoms were forgetfulness.  Recent episodes of confusion likely secondary to medications, such as Seroquel.  There is concern for polypharmacy and correct management. 1.  I would discontinue Aricept, as it is  likely no longer effective at this point. 2.  Continue Namenda and Seroquel at current dose. 3.  Stressed importance of physical exercise.  Take regular walks (with cane). 4.  I recommend getting a book of brain teasers. 5.  Stay social and engage with people. 6.  Follow up in 3 months.  Thank you for allowing me to take part in the care of this patient.  Shon Millet, DO  CC: Willow Ora, MD

## 2012-08-29 ENCOUNTER — Other Ambulatory Visit: Payer: Self-pay | Admitting: Internal Medicine

## 2012-08-30 NOTE — Telephone Encounter (Signed)
Refill done.  

## 2012-08-30 NOTE — Telephone Encounter (Signed)
To my knowledge she is taking seroquel 25 mg one by mouth each bedtime. Okay to refill 6 months.

## 2012-08-30 NOTE — Telephone Encounter (Signed)
Would like to verify correct dosing of Seroquel. On med list pt. Takes 25mg  at bedtime however, refill request is for 50mg  at bedtime. Please advise.

## 2012-08-31 ENCOUNTER — Ambulatory Visit (INDEPENDENT_AMBULATORY_CARE_PROVIDER_SITE_OTHER): Payer: PRIVATE HEALTH INSURANCE | Admitting: *Deleted

## 2012-08-31 DIAGNOSIS — I4891 Unspecified atrial fibrillation: Secondary | ICD-10-CM

## 2012-09-01 ENCOUNTER — Other Ambulatory Visit: Payer: Self-pay | Admitting: Cardiology

## 2012-09-04 ENCOUNTER — Telehealth: Payer: Self-pay | Admitting: Internal Medicine

## 2012-09-04 NOTE — Telephone Encounter (Signed)
Spoke with patient's daughter, Sara Carlson. She states that the neurologist reccommended more home health services but asked that her primary care MD arrange. Pt daughter has contacted [facility] (home health agency) as well as medicaid to find out what forms need to be filled out. Forms printed from the  Internet per daughters request. Required sections are A, B, D, E. Information given to Dr. Drue Novel for review.

## 2012-09-04 NOTE — Telephone Encounter (Signed)
Need to see orders

## 2012-09-04 NOTE — Telephone Encounter (Signed)
Patient's daughter called stating they need dr Drue Novel to sign orders allowing home health to come more often. CB# [phone]

## 2012-09-05 NOTE — Telephone Encounter (Signed)
I really don't understand the  Paper work, neurology note reviewed, no mention of any specific referral. I will defer this to neurology

## 2012-09-06 NOTE — Telephone Encounter (Signed)
Spoke with patient's daughter she will contact neurology for paperwork. Will close encounter.

## 2012-09-14 ENCOUNTER — Emergency Department (HOSPITAL_COMMUNITY): Payer: PRIVATE HEALTH INSURANCE

## 2012-09-14 ENCOUNTER — Emergency Department (HOSPITAL_COMMUNITY)
Admission: EM | Admit: 2012-09-14 | Discharge: 2012-09-14 | Disposition: A | Discharge status: Home / Self Care | Payer: PRIVATE HEALTH INSURANCE | Attending: Emergency Medicine | Admitting: Emergency Medicine

## 2012-09-14 ENCOUNTER — Encounter (HOSPITAL_COMMUNITY): Payer: Self-pay | Admitting: Emergency Medicine

## 2012-09-14 DIAGNOSIS — F341 Dysthymic disorder: Secondary | ICD-10-CM | POA: Insufficient documentation

## 2012-09-14 DIAGNOSIS — W19XXXA Unspecified fall, initial encounter: Secondary | ICD-10-CM | POA: Diagnosis not present

## 2012-09-14 DIAGNOSIS — G309 Alzheimer's disease, unspecified: Secondary | ICD-10-CM | POA: Insufficient documentation

## 2012-09-14 DIAGNOSIS — IMO0002 Reserved for concepts with insufficient information to code with codable children: Secondary | ICD-10-CM | POA: Diagnosis not present

## 2012-09-14 DIAGNOSIS — S6000XA Contusion of unspecified finger without damage to nail, initial encounter: Secondary | ICD-10-CM | POA: Insufficient documentation

## 2012-09-14 DIAGNOSIS — I4891 Unspecified atrial fibrillation: Secondary | ICD-10-CM | POA: Diagnosis not present

## 2012-09-14 DIAGNOSIS — F0280 Dementia in other diseases classified elsewhere without behavioral disturbance: Secondary | ICD-10-CM | POA: Diagnosis not present

## 2012-09-14 DIAGNOSIS — I509 Heart failure, unspecified: Secondary | ICD-10-CM | POA: Diagnosis not present

## 2012-09-14 DIAGNOSIS — I10 Essential (primary) hypertension: Secondary | ICD-10-CM | POA: Insufficient documentation

## 2012-09-14 DIAGNOSIS — S0003XA Contusion of scalp, initial encounter: Secondary | ICD-10-CM

## 2012-09-14 DIAGNOSIS — Z7901 Long term (current) use of anticoagulants: Secondary | ICD-10-CM | POA: Insufficient documentation

## 2012-09-14 DIAGNOSIS — S1093XA Contusion of unspecified part of neck, initial encounter: Secondary | ICD-10-CM | POA: Insufficient documentation

## 2012-09-14 DIAGNOSIS — Y939 Activity, unspecified: Secondary | ICD-10-CM | POA: Insufficient documentation

## 2012-09-14 DIAGNOSIS — Y92009 Unspecified place in unspecified non-institutional (private) residence as the place of occurrence of the external cause: Secondary | ICD-10-CM | POA: Diagnosis not present

## 2012-09-14 DIAGNOSIS — Z862 Personal history of diseases of the blood and blood-forming organs and certain disorders involving the immune mechanism: Secondary | ICD-10-CM | POA: Insufficient documentation

## 2012-09-14 DIAGNOSIS — M109 Gout, unspecified: Secondary | ICD-10-CM | POA: Insufficient documentation

## 2012-09-14 DIAGNOSIS — S0990XA Unspecified injury of head, initial encounter: Secondary | ICD-10-CM | POA: Diagnosis present

## 2012-09-14 DIAGNOSIS — Z8639 Personal history of other endocrine, nutritional and metabolic disease: Secondary | ICD-10-CM | POA: Insufficient documentation

## 2012-09-14 DIAGNOSIS — Z79899 Other long term (current) drug therapy: Secondary | ICD-10-CM | POA: Insufficient documentation

## 2012-09-14 LAB — PROTIME-INR
INR: 2.98 — ABNORMAL HIGH (ref 0.00–1.49)
Prothrombin Time: 29.9 seconds — ABNORMAL HIGH (ref 11.6–15.2)

## 2012-09-14 LAB — BASIC METABOLIC PANEL
Calcium: 9.5 mg/dL (ref 8.4–10.5)
Creatinine, Ser: 2.22 mg/dL — ABNORMAL HIGH (ref 0.50–1.10)
GFR calc non Af Amer: 19 mL/min — ABNORMAL LOW (ref >90)
Sodium: 139 mEq/L (ref 135–145)

## 2012-09-14 LAB — CBC WITH DIFFERENTIAL/PLATELET
Basophils Absolute: 0 10*3/uL (ref 0.0–0.1)
Basophils Relative: 1 % (ref 0–1)
Eosinophils Absolute: 0.1 10*3/uL (ref 0.0–0.7)
Eosinophils Relative: 2 % (ref 0–5)
HCT: 31.5 % — ABNORMAL LOW (ref 36.0–46.0)
Lymphocytes Relative: 34 % (ref 12–46)
MCHC: 33 g/dL (ref 30.0–36.0)
MCV: 91.3 fL (ref 78.0–100.0)
Monocytes Absolute: 0.5 10*3/uL (ref 0.1–1.0)
Platelets: 224 10*3/uL (ref 150–400)
RDW: 14.7 % (ref 11.5–15.5)
WBC: 4.6 10*3/uL (ref 4.0–10.5)

## 2012-09-14 LAB — POCT I-STAT, CHEM 8
Calcium, Ion: 1.17 mmol/L (ref 1.13–1.30)
HCT: 32 % — ABNORMAL LOW (ref 36.0–46.0)
TCO2: 21 mmol/L (ref 0–100)

## 2012-09-14 LAB — URINALYSIS, ROUTINE W REFLEX MICROSCOPIC
Ketones, ur: NEGATIVE mg/dL
Leukocytes, UA: NEGATIVE
Nitrite: NEGATIVE
Protein, ur: NEGATIVE mg/dL
Urobilinogen, UA: 0.2 mg/dL (ref 0.0–1.0)

## 2012-09-14 NOTE — Progress Notes (Signed)
EDCM spoke to patient and family at bedside.  Patient's daughter Sara Carlson at bedside.  As per patient's daughter, patient live in an independent living facilty called [facility]. Patient's daughter reports the patient's pcp is Dr. Drue Novel.  Patient has a cane and walker at home , but has per patient's daughter, she does not use the walker.  Patient's daughter also reports that the patient has a a nursing aide come to the patient's home every day from the agency [facility].  EDCM provided alist of home health agencies in [place].  EDCM explained a visiting RN, PT, OT aide and social worker would come to patient's home.  A social worker is ordered if the patient requests to be placed into a SNF or rehab facility.  EDCM asked patient's daugheter if she would like to set up home health services at this time.  As per patient's daughter, they will think about it and decide at another tme.  Provided patient's daughter name and phone number of [facility].  Patient and family thankful for resources.  No further needs at this time.

## 2012-09-14 NOTE — ED Notes (Addendum)
Pt brought in by daughter, pt reports she fell while in the bathroom but "doesn't remember If I lost consciousness." Pt is on coumadin but denies any blurred vision, headaches, weakness or pain. Pt has 2 cm x .5 cm abrasion on her left eyebrow. Pt normally walks with a cane and lives by herself. Pt ambulatory upon arrival to the ED. Pt AxO x4 and NAD noted at this time.

## 2012-09-14 NOTE — ED Provider Notes (Addendum)
CSN: [id number]     Arrival date & time 09/14/12  1558 History  This chart was scribed for Sara Sprout, MD by Greggory Stallion, ED Scribe. This patient was seen in room WA01/WA01 and the patient's care was started at 4:01 PM.   No chief complaint on file.  The history is provided by the patient and a relative. No language interpreter was used.    HPI Comments: Sara Carlson is a 79 y.o. female who presents to the Emergency Department complaining of a fall that happened earlier today. Pt states her right thumb hurts and she has a contusion on her forehead. She thinks she tripped over something and is not sure of LOC. Pt's daughter states she has Alzheimer's. She states this is the first fall she has had. Pt's daughter states she has been walking normally. She states pt's coumadin levels were checked one week ago and were 2.2. Pt denies HA, hip pain, wrist pain, elbow pain, knee pain and neck pain as associated symptoms.   Past Medical History  Diagnosis Date  . CHF (congestive heart failure)   . Atrial fibrillation     paroxismal  . Hyperlipidemia   . Hypertension   . Anxiety and depression   . Dementia   . Allergic rhinitis   . Gout    Past Surgical History  Procedure Laterality Date  . Abdominal hysterectomy      no oophorectomy   Family History  Problem Relation Age of Onset  . Colon cancer Neg Hx   . Breast cancer Neg Hx   . Diabetes Neg Hx   . Coronary artery disease Neg Hx   . Ataxia Neg Hx   . Chorea Neg Hx   . Mental retardation Neg Hx   . Migraines Neg Hx   . Multiple sclerosis Neg Hx   . Neurofibromatosis Neg Hx   . Neuropathy Neg Hx   . Parkinsonism Neg Hx   . Seizures Neg Hx    History  Substance Use Topics  . Smoking status: Never Smoker   . Smokeless tobacco: Never Used  . Alcohol Use: 0.0 oz/week     Comment: occasionally   OB History   Grav Para Term Preterm Abortions TAB SAB Ect Mult Living                 Review of Systems  A complete 10  system review of systems was obtained and all systems are negative except as noted in the HPI and PMH.   Allergies  Review of patient's allergies indicates no known allergies.  Home Medications   Current Outpatient Rx  Name  Route  Sig  Dispense  Refill  . CALCIUM-VITAMIN D PO   Oral   Take 1 tablet by mouth daily.          . carvedilol (COREG) 6.25 MG tablet      take 1 tablet by mouth twice a day   60 tablet   11   . donepezil (ARICEPT) 10 MG tablet      take 1 tablet by mouth once daily   30 tablet   5   . doxazosin (CARDURA) 8 MG tablet      take 1 tablet by mouth at bedtime   90 tablet   1   . FIBER FORMULA PO   Oral   Take by mouth as needed.         . furosemide (LASIX) 40 MG tablet   Oral  Take 1 tablet (40 mg total) by mouth daily.   90 tablet   1   . loratadine (CLARITIN) 5 MG chewable tablet   Oral   Chew 5 mg by mouth daily.           . metolazone (ZAROXOLYN) 2.5 MG tablet      take 1 tablet by mouth once daily   30 tablet   6     TAKE 30 MIN BEFORE AM DOSE OF FUROSEMIDE./CY   . QUEtiapine (SEROQUEL) 25 MG tablet   Oral   Take 25 mg by mouth at bedtime.         Marland Kitchen QUEtiapine (SEROQUEL) 25 MG tablet   Oral   Take 1 tablet (25 mg total) by mouth at bedtime.   90 tablet   1   . sildenafil (REVATIO) 20 MG tablet   Oral   Take 1 tablet (20 mg total) by mouth 3 (three) times daily.   90 tablet   11   . spironolactone (ALDACTONE) 25 MG tablet               . ULORIC 40 MG tablet      take 1 tablet by mouth once daily   30 tablet   6   . warfarin (COUMADIN) 2.5 MG tablet      Take as directed by anticoagulation clinic   60 tablet   3     60 tabs is 30 day supply    BP 121/78  Pulse 65  Temp(Src) 98 F (36.7 C) (Oral)  Resp 20  SpO2 100%  Physical Exam  Nursing note and vitals reviewed. Constitutional: She is oriented to person, place, and time. She appears well-developed and well-nourished. No distress.   HENT:  Head: Normocephalic and atraumatic.  Right Ear: Tympanic membrane and external ear normal.  Left Ear: Tympanic membrane and external ear normal.  Eyes: Conjunctivae and EOM are normal. Pupils are equal, round, and reactive to light.  Neck: Normal range of motion. Neck supple. No tracheal deviation present.  Cardiovascular: Normal rate and regular rhythm.  Exam reveals no gallop and no friction rub.   No murmur heard. Pulmonary/Chest: Effort normal and breath sounds normal. No respiratory distress. She has no wheezes. She has no rales.  Abdominal: There is no tenderness.  Musculoskeletal: Normal range of motion.       Right shoulder: Normal.       Left shoulder: Normal.       Right hip: Normal.       Left hip: Normal.       Right knee: Normal.       Left knee: Normal.  Right thumb is ecchymotic, swollen, and tender. Normal tendon function.   Neurological: She is alert and oriented to person, place, and time.  Disoriented to time. Dementia.   Skin: Skin is warm and dry. No rash noted.  Contusion and abrasion over left forehead.   Psychiatric: She has a normal mood and affect. Her behavior is normal.    ED Course   Procedures (including critical care time)  DIAGNOSTIC STUDIES: Oxygen Saturation is 100% on RA, normal by my interpretation.    COORDINATION OF CARE: 4:11 PM-Discussed treatment plan which includes xrays with pt at bedside and pt agreed to plan.   Labs Reviewed - No data to display Ct Head Wo Contrast  09/14/2012   *RADIOLOGY REPORT*  Clinical Data:  History of fall with head injury.  The patient is on Coumadin.  CT HEAD WITHOUT CONTRAST CT CERVICAL SPINE WITHOUT CONTRAST  Technique:  Multidetector CT imaging of the head and cervical spine was performed following the standard protocol without intravenous contrast.  Multiplanar CT image reconstructions of the cervical spine were also generated.  Comparison:  CT of the head 10/14/2010.  CT of the cervical spine  09/21/2007.  CT HEAD  Findings: There is soft tissue thickening and high attenuation in the left frontal scalp, compatible with a small left frontal scalp contusion and hematoma.  No underlying displaced skull fracture is identified at this time.  Moderate cerebral and mild cerebellar atrophy.  Extensive patchy and confluent areas of decreased attenuation throughout the deep and periventricular white matter of the cerebral hemispheres bilaterally, compatible with advanced chronic microvascular ischemic disease.  Well-defined focus of low attenuation in the left thalamus compatible with an old lacunar infarction, similar to the prior study.  No signs of acute post- traumatic intracranial hemorrhage, no mass, mass effect, hydrocephalus or other abnormal intra or extra-axial fluid collections.  Mastoids are well pneumatized bilaterally.  There is an intermediate attenuation air fluid level in the right maxillary sinus.  Mucosal thickening throughout the frontal, ethmoidal and maxillary sinuses bilaterally is noted, with opacification of some of the frontal ethmoid cells, and complete opacification of the left maxillary sinus with large amounts of intermediate to high attenuation material along the medial aspect of the sinus.  IMPRESSION: 1.  Left frontal scalp hematoma without evidence of underlying displaced skull fracture or acute intracranial abnormalities. 2.  Possible small volume of hemosinus in the right maxillary sinus.  Alternatively, this may simply represent some proteinaceous secretions related to sinusitis. 3.  Moderate cerebral and mild cerebellar atrophy with extensive chronic microvascular ischemic changes and old lacunar infarction, as above. 4.  Unusual appearance of the left maxillary sinus.  This may simply represent a mucocele with inspissated secretions, but the possibility of an underlying sinus neoplasm is not excluded. Clinical correlation is recommended, with consideration for further  evaluation with nonemergent dedicated sinus CT scan with and without contrast if indicated.  CT CERVICAL SPINE  Findings: No acute displaced fractures of the cervical spine.  Mild straightening of normal cervical lordosis is presumably related to underlying degenerative disc disease and is chronic (similar to the prior study).  Mild multilevel degenerative disc disease, most severe at C4-C5 and C5-C6.  At C4-C5 there is a 2 mm of anterolisthesis of C4 upon C5.  No prevertebral soft tissue swelling to suggest acute injury in this region.  Mild multilevel facet arthropathy.  Retropharyngeal course of the common carotid arteries bilaterally with extensive atherosclerotic disease. Heterogeneous appearance of the thyroid gland redemonstrated with multiple small nodules, increased in size compared to the prior study, most notably a 1.9 x 1.5 cm nodule with heterogeneous attenuation in the right lobe of the gland.  IMPRESSION: 1.  No evidence of significant acute traumatic injury to the cervical spine. 2.  Mild multilevel degenerative disc disease and cervical spondylosis, as above, including 2 mm of anterolisthesis of C4 upon C5 which is favored to be chronic. 3.  Enlarged and heterogeneous appearing thyroid gland with multiple nodules, as above.   Original Report Authenticated By: Trudie Reed, M.D.   Ct Cervical Spine Wo Contrast  09/14/2012   *RADIOLOGY REPORT*  Clinical Data:  History of fall with head injury.  The patient is on Coumadin.  CT HEAD WITHOUT CONTRAST CT CERVICAL SPINE WITHOUT CONTRAST  Technique:  Multidetector CT imaging of the head and  cervical spine was performed following the standard protocol without intravenous contrast.  Multiplanar CT image reconstructions of the cervical spine were also generated.  Comparison:  CT of the head 10/14/2010.  CT of the cervical spine 09/21/2007.  CT HEAD  Findings: There is soft tissue thickening and high attenuation in the left frontal scalp, compatible with a  small left frontal scalp contusion and hematoma.  No underlying displaced skull fracture is identified at this time.  Moderate cerebral and mild cerebellar atrophy.  Extensive patchy and confluent areas of decreased attenuation throughout the deep and periventricular white matter of the cerebral hemispheres bilaterally, compatible with advanced chronic microvascular ischemic disease.  Well-defined focus of low attenuation in the left thalamus compatible with an old lacunar infarction, similar to the prior study.  No signs of acute post- traumatic intracranial hemorrhage, no mass, mass effect, hydrocephalus or other abnormal intra or extra-axial fluid collections.  Mastoids are well pneumatized bilaterally.  There is an intermediate attenuation air fluid level in the right maxillary sinus.  Mucosal thickening throughout the frontal, ethmoidal and maxillary sinuses bilaterally is noted, with opacification of some of the frontal ethmoid cells, and complete opacification of the left maxillary sinus with large amounts of intermediate to high attenuation material along the medial aspect of the sinus.  IMPRESSION: 1.  Left frontal scalp hematoma without evidence of underlying displaced skull fracture or acute intracranial abnormalities. 2.  Possible small volume of hemosinus in the right maxillary sinus.  Alternatively, this may simply represent some proteinaceous secretions related to sinusitis. 3.  Moderate cerebral and mild cerebellar atrophy with extensive chronic microvascular ischemic changes and old lacunar infarction, as above. 4.  Unusual appearance of the left maxillary sinus.  This may simply represent a mucocele with inspissated secretions, but the possibility of an underlying sinus neoplasm is not excluded. Clinical correlation is recommended, with consideration for further evaluation with nonemergent dedicated sinus CT scan with and without contrast if indicated.  CT CERVICAL SPINE  Findings: No acute  displaced fractures of the cervical spine.  Mild straightening of normal cervical lordosis is presumably related to underlying degenerative disc disease and is chronic (similar to the prior study).  Mild multilevel degenerative disc disease, most severe at C4-C5 and C5-C6.  At C4-C5 there is a 2 mm of anterolisthesis of C4 upon C5.  No prevertebral soft tissue swelling to suggest acute injury in this region.  Mild multilevel facet arthropathy.  Retropharyngeal course of the common carotid arteries bilaterally with extensive atherosclerotic disease. Heterogeneous appearance of the thyroid gland redemonstrated with multiple small nodules, increased in size compared to the prior study, most notably a 1.9 x 1.5 cm nodule with heterogeneous attenuation in the right lobe of the gland.  IMPRESSION: 1.  No evidence of significant acute traumatic injury to the cervical spine. 2.  Mild multilevel degenerative disc disease and cervical spondylosis, as above, including 2 mm of anterolisthesis of C4 upon C5 which is favored to be chronic. 3.  Enlarged and heterogeneous appearing thyroid gland with multiple nodules, as above.   Original Report Authenticated By: Trudie Reed, M.D.   Dg Hand Complete Right  09/14/2012   *RADIOLOGY REPORT*  Clinical Data: Status post fall with pain in the right  RIGHT HAND - COMPLETE 3+ VIEW  Comparison: None.  Findings: There are curvilinear lucencies identified in the distal aspect of the first proximal phalanx and proximal aspect of the first distal phalanx consistent with nondisplaced fractures.  There is no dislocation.  IMPRESSION:  Fracture of the thumb as described.   Original Report Authenticated By: Sherian Rein, M.D.   1. Fall at home, initial encounter   2. Scalp contusion, initial encounter     MDM   Patient most likely with mechanical fall. However she has dementia of the fall was unwitnessed and she can't remember if she had LOC or syncope however seems most likely that it  was mechanical. Patient continues to say that she was bending over and thinks that's what caused her to fall. Patient has been ambulatory since the fall without difficulty per the daughter. She does have contusion over her left forehead and swelling and ecchymosis to her right thumb. Otherwise new signs concerning for hip fractures, knee injury or shoulder injury. Her right wrist without any pain and full range of motion. Patient is on Coumadin last level was checked last week and it was 2.2.  Head and C-spine CT pending. Plain film of the hands pending. CBC, i-STAT, INR, UA pending to ensure no other cause for her fall today. Vital signs are normal.   6:24 PM CT and plain films neg for acute issues.  CT with sinus abnormalities and when speaking with pt she does have sinus problems and will f/u with PCP for outpt CT for further eval.  Pt's Cr.2.3 today but looking back appears to go from 1.6-2.6 for the last 5 years.  Will also have pt f/u for the Cr recheck.  Pt is ambulatory and o/w well appearing.  Will d/c home.    I personally performed the services described in this documentation, which was scribed in my presence.  The recorded information has been reviewed and considered.   Sara Sprout, MD 09/14/12 1851  Sara Sprout, MD 09/14/12 [phone]

## 2012-09-19 ENCOUNTER — Other Ambulatory Visit: Payer: Self-pay

## 2012-09-28 ENCOUNTER — Ambulatory Visit (INDEPENDENT_AMBULATORY_CARE_PROVIDER_SITE_OTHER): Payer: PRIVATE HEALTH INSURANCE | Admitting: *Deleted

## 2012-09-28 ENCOUNTER — Ambulatory Visit (INDEPENDENT_AMBULATORY_CARE_PROVIDER_SITE_OTHER): Payer: PRIVATE HEALTH INSURANCE | Admitting: Cardiovascular Disease

## 2012-09-28 ENCOUNTER — Encounter: Payer: Self-pay | Admitting: Cardiovascular Disease

## 2012-09-28 VITALS — BP 118/60 | HR 45 | Ht 62.0 in | Wt 133.1 lb

## 2012-09-28 DIAGNOSIS — I4891 Unspecified atrial fibrillation: Secondary | ICD-10-CM

## 2012-09-28 DIAGNOSIS — I10 Essential (primary) hypertension: Secondary | ICD-10-CM

## 2012-09-28 DIAGNOSIS — I509 Heart failure, unspecified: Secondary | ICD-10-CM

## 2012-09-28 LAB — POCT INR: INR: 2.6

## 2012-09-28 NOTE — Progress Notes (Signed)
Patient ID: Sara Carlson, female   DOB: [birth year], 79 y.o.   MRN: [id number] Sara Carlson is seen today for f/U of afib, anticoagulation, MR and pulmonary hypertension. Despite her age she has been a reasonable coumadin candidate still living indep. and with no falls. Her INR's have bee Rx. She has had mild to moderate MR in the past but elevated PA pressures by echo disproportionately high at mid 80s. She has had lower extremity edema likely from pulmonary hypertension and has responded well to Lasix and aldactone. She is at assisted living [name] and [name] takes good care of her. She loves her Snickers candy but has to limit it because of gout.  Started on revatio last visit and seems to be tolerating and doing well. Weight up 4lbs as she was too dry last visit and she feels stronger  Echo 05/04/11  Study Conclusions  - Left ventricle: Inferior wall hypokinesis The cavity size was mildly dilated. The estimated ejection fraction was 40%. - Mitral valve: Moderate regurgitation. - Left atrium: The atrium was moderately dilated. - Carlson ventricle: The cavity size was mildly dilated. - Carlson atrium: The atrium was severely dilated. - Atrial septum: No defect or patent foramen ovale was identified. - Tricuspid valve: Severe regurgitation. - Pulmonary arteries: PA peak pressure: 65mm Hg (S). - Impressions: Severe TR with signs of pulmonary hypetension Impressions:  She seems to be failing  Tired looking Hair not well kept.  Fell 2 weeks ago and hit head CT negative.  Will need to stop coumadin if this recurrs  ROS: Denies fever, malais, weight loss, blurry vision, decreased visual acuity, cough, sputum, SOB, hemoptysis, pleuritic pain, palpitaitons, heartburn, abdominal pain, melena, lower extremity edema, claudication, or rash.  All other systems reviewed and negative  General: Affect appropriate Elderly Drawn black female HEENT: normal Neck supple with no  adenopathy JVP normal no bruits no thyromegaly Lungs clear with no wheezing and good diaphragmatic motion Heart:  S1/S2 systolic murmur, no rub, gallop or click PMI normal Abdomen: benighn, BS positve, no tenderness, no AAA no bruit.  No HSM or HJR Distal pulses intact with no bruits No edema Neuro non-focal Skin warm and dry No muscular weakness   Current Outpatient Prescriptions  Medication Sig Dispense Refill  . acetaminophen (TYLENOL) 500 MG tablet Take 1,000 mg by mouth every 6 (six) hours as needed for pain.      . carvedilol (COREG) 6.25 MG tablet take 1 tablet by mouth twice a day  60 tablet  11  . doxazosin (CARDURA) 8 MG tablet take 1 tablet by mouth at bedtime  90 tablet  1  . furosemide (LASIX) 40 MG tablet Take 1 tablet (40 mg total) by mouth daily.  90 tablet  1  . metolazone (ZAROXOLYN) 2.5 MG tablet take 1 tablet by mouth once daily  30 tablet  6  . QUEtiapine (SEROQUEL) 25 MG tablet Take 25 mg by mouth at bedtime.      . sildenafil (REVATIO) 20 MG tablet Take 1 tablet (20 mg total) by mouth 3 (three) times daily.  90 tablet  11  . spironolactone (ALDACTONE) 25 MG tablet Take 12.5 mg by mouth every morning.       Marland Kitchen ULORIC 40 MG tablet take 1 tablet by mouth once daily  30 tablet  6  . warfarin (COUMADIN) 2.5 MG tablet Take 2.5-5 mg by mouth daily. Take 1 tablet on Tuesday. Take 2 tablets all other days.  No current facility-administered medications for this visit.    Allergies  Review of patient's allergies indicates no known allergies.  Electrocardiogram:  afib low voltage poor R wave progression nonspecific ST/ [place] change rate 76  Assessment and Plan

## 2012-09-28 NOTE — Assessment & Plan Note (Signed)
Euvolemic to dry  Edema gone Much improved on diuretics

## 2012-09-28 NOTE — Patient Instructions (Signed)
Your physician recommends that you schedule a follow-up appointment in:  3 MONTHS WITH  DR NISHAN  Your physician recommends that you continue on your current medications as directed. Please refer to the Current Medication list given to you today.  

## 2012-09-28 NOTE — Assessment & Plan Note (Signed)
Good rate contorl INR Rx 2.6 today If she has recurent falls will need to stop anticoagulation

## 2012-09-28 NOTE — Assessment & Plan Note (Signed)
Well controlled.  Continue current medications and low sodium Dash type diet.    

## 2012-09-28 NOTE — Assessment & Plan Note (Signed)
Stabel continue vasodilators

## 2012-10-17 ENCOUNTER — Telehealth: Payer: Self-pay | Admitting: *Deleted

## 2012-10-17 DIAGNOSIS — E875 Hyperkalemia: Secondary | ICD-10-CM

## 2012-10-17 MED ORDER — POTASSIUM CHLORIDE CRYS ER 20 MEQ PO TBCR: 20.0000 meq | EXTENDED_RELEASE_TABLET | Freq: Every day | ORAL | Status: AC

## 2012-10-17 NOTE — Telephone Encounter (Signed)
SPOKEN WITH DAUGHTER PT HAS BEEN TAKING  K-DUR  20 MEQ  1/2 TAB  BID  JUST RECENTLY RAN OUT REFILL SENT VIA EPIC  AND WILL CHECK BMET  ON  10-31-12 AT  9:45 AM./CY

## 2012-10-17 NOTE — Telephone Encounter (Signed)
LMTCB  RE  PHARMACY REQUESTING  K REFILL  LAST  K VALUE WAS  5.6  ON 8-1-14NOT SURE PT  IS STILL TAKING  WILL DISCUSS WITH  DAUGHTER./CY

## 2012-10-31 ENCOUNTER — Other Ambulatory Visit (INDEPENDENT_AMBULATORY_CARE_PROVIDER_SITE_OTHER): Payer: PRIVATE HEALTH INSURANCE

## 2012-10-31 ENCOUNTER — Ambulatory Visit (INDEPENDENT_AMBULATORY_CARE_PROVIDER_SITE_OTHER): Payer: PRIVATE HEALTH INSURANCE | Admitting: Pharmacist

## 2012-10-31 DIAGNOSIS — I4891 Unspecified atrial fibrillation: Secondary | ICD-10-CM

## 2012-10-31 DIAGNOSIS — E875 Hyperkalemia: Secondary | ICD-10-CM

## 2012-10-31 LAB — BASIC METABOLIC PANEL
BUN: 64 mg/dL — ABNORMAL HIGH (ref 6–23)
CO2: 28 mEq/L (ref 19–32)
Calcium: 9.6 mg/dL (ref 8.4–10.5)
Creatinine, Ser: 2 mg/dL — ABNORMAL HIGH (ref 0.4–1.2)

## 2012-10-31 LAB — POCT INR: INR: 3.3

## 2012-11-01 ENCOUNTER — Other Ambulatory Visit: Payer: Self-pay | Admitting: *Deleted

## 2012-11-01 DIAGNOSIS — Z79899 Other long term (current) drug therapy: Secondary | ICD-10-CM

## 2012-11-03 ENCOUNTER — Other Ambulatory Visit: Payer: Self-pay | Admitting: Internal Medicine

## 2012-11-05 NOTE — Telephone Encounter (Signed)
rx refilled per protocol. DJR  

## 2012-11-11 ENCOUNTER — Emergency Department (HOSPITAL_COMMUNITY)
Admission: EM | Admit: 2012-11-11 | Discharge: 2012-11-12 | Disposition: A | Discharge status: Skilled Nursing Facility | Payer: PRIVATE HEALTH INSURANCE | Attending: Emergency Medicine | Admitting: Emergency Medicine

## 2012-11-11 ENCOUNTER — Emergency Department (HOSPITAL_COMMUNITY): Payer: PRIVATE HEALTH INSURANCE

## 2012-11-11 DIAGNOSIS — Z7901 Long term (current) use of anticoagulants: Secondary | ICD-10-CM | POA: Insufficient documentation

## 2012-11-11 DIAGNOSIS — F411 Generalized anxiety disorder: Secondary | ICD-10-CM | POA: Insufficient documentation

## 2012-11-11 DIAGNOSIS — F3289 Other specified depressive episodes: Secondary | ICD-10-CM | POA: Insufficient documentation

## 2012-11-11 DIAGNOSIS — I4891 Unspecified atrial fibrillation: Secondary | ICD-10-CM | POA: Insufficient documentation

## 2012-11-11 DIAGNOSIS — F0390 Unspecified dementia without behavioral disturbance: Secondary | ICD-10-CM

## 2012-11-11 DIAGNOSIS — Z79899 Other long term (current) drug therapy: Secondary | ICD-10-CM | POA: Insufficient documentation

## 2012-11-11 DIAGNOSIS — R413 Other amnesia: Secondary | ICD-10-CM | POA: Insufficient documentation

## 2012-11-11 DIAGNOSIS — F329 Major depressive disorder, single episode, unspecified: Secondary | ICD-10-CM | POA: Insufficient documentation

## 2012-11-11 DIAGNOSIS — I10 Essential (primary) hypertension: Secondary | ICD-10-CM | POA: Insufficient documentation

## 2012-11-11 DIAGNOSIS — M109 Gout, unspecified: Secondary | ICD-10-CM | POA: Insufficient documentation

## 2012-11-11 DIAGNOSIS — F29 Unspecified psychosis not due to a substance or known physiological condition: Secondary | ICD-10-CM | POA: Insufficient documentation

## 2012-11-11 DIAGNOSIS — I509 Heart failure, unspecified: Secondary | ICD-10-CM | POA: Insufficient documentation

## 2012-11-11 LAB — COMPREHENSIVE METABOLIC PANEL
ALT: 7 U/L (ref 0–35)
AST: 17 U/L (ref 0–37)
Albumin: 3.8 g/dL (ref 3.5–5.2)
Alkaline Phosphatase: 82 U/L (ref 39–117)
Calcium: 10 mg/dL (ref 8.4–10.5)
Glucose, Bld: 112 mg/dL — ABNORMAL HIGH (ref 70–99)
Potassium: 4.3 mEq/L (ref 3.5–5.1)
Sodium: 138 mEq/L (ref 135–145)
Total Protein: 7.5 g/dL (ref 6.0–8.3)

## 2012-11-11 LAB — CBC WITH DIFFERENTIAL/PLATELET
Basophils Absolute: 0 10*3/uL (ref 0.0–0.1)
Eosinophils Absolute: 0.1 10*3/uL (ref 0.0–0.7)
Eosinophils Relative: 2 % (ref 0–5)
Lymphs Abs: 1.9 10*3/uL (ref 0.7–4.0)
MCH: 29.3 pg (ref 26.0–34.0)
Neutrophils Relative %: 55 % (ref 43–77)
Platelets: 280 10*3/uL (ref 150–400)
RBC: 3.89 MIL/uL (ref 3.87–5.11)
RDW: 14.4 % (ref 11.5–15.5)
WBC: 6.4 10*3/uL (ref 4.0–10.5)

## 2012-11-11 LAB — URINALYSIS, ROUTINE W REFLEX MICROSCOPIC
Bilirubin Urine: NEGATIVE
Glucose, UA: NEGATIVE mg/dL
Hgb urine dipstick: NEGATIVE
Ketones, ur: NEGATIVE mg/dL
Nitrite: NEGATIVE
Specific Gravity, Urine: 1.017 (ref 1.005–1.030)
pH: 5 (ref 5.0–8.0)

## 2012-11-11 NOTE — ED Notes (Signed)
Family at bedside. 

## 2012-11-11 NOTE — ED Notes (Signed)
Since approximately 3pm today pt has been acting more delusional, and confused per her norm. States that she does have alzheimer's and dementia. Did not know where she lived. Lives in independent living at [place] and [address]. Pt does not know where she is or what year it is.

## 2012-11-11 NOTE — ED Notes (Signed)
Patient transported to CT 

## 2012-11-12 ENCOUNTER — Encounter (HOSPITAL_COMMUNITY): Payer: Self-pay

## 2012-11-12 LAB — PROTIME-INR: INR: 2.94 — ABNORMAL HIGH (ref 0.00–1.49)

## 2012-11-12 MED ORDER — TUBERCULIN PPD 5 UNIT/0.1ML ID SOLN
5.0000 [IU] | Freq: Once | INTRADERMAL | Status: DC
  Administered 2012-11-12: 5 [IU] via INTRADERMAL
  Filled 2012-11-12: qty 0.1

## 2012-11-12 NOTE — ED Notes (Signed)
TB test performed on patient's left mid fore-arm. Family given instructions for follow-up reading.

## 2012-11-12 NOTE — Progress Notes (Signed)
CSW met with pt and pt daughter. Pt and pt daughter is in agreement for discharge to [facility]. Pt daughter to transport patient to ALF once she returns with pt clothes. Patient daughter to be given discharge packet with fl2, avs, and pasarr number upon discharge to give to [facility] ALF. CSW informed pt RN and EDP of patient discharge once patient daughter returns with patient clothes and to provide transport.   Catha Gosselin, LCSW [phone]  ED CSW .11/12/2012 1442pm

## 2012-11-12 NOTE — Progress Notes (Addendum)
CSW met with pt at bedside, along with pt daughter. Pt has history of dementia and having difficulty at home. Per discussion with daughter, pt has begun to think she is in [place] and needs more assistance at home and supervision. Pt daughter shared that patient needs assistance with bathing, and cooking food. Patient daughter shared she has been beginning to look into placement at [facility] and as been working with admissions. CSW will complete Fl2 and clinical information needed to discuss placement for patient at [facility]. CSW will initiate placement. CSW and pt discussed that we will initiate the placement today however if placement is not availabel today, then we can arrange home health to assist with placement from home. Pt currently has a CNA from [employer] that is provided through [place]. Pt pending pt.    Catha Gosselin, LCSW [phone]  ED CSW .11/12/2012 7:46am   Addendum:  CSW spoke with PT who states patient need supervision but is able to ambulate and transfer independently and agree with placement in ALF. CSW completed fl2 and obtained pasarr number. Pt information being reviewed by [facility] place alf at this time awaiting return call.   Catha Gosselin, LCSW [phone]  ED CSW .11/12/2012 11:49am   Pt received message from [place] at [facility]. Pt room is being prepared. CSW waiting for final call for patient to be discharged.  Catha Gosselin, LCSW [phone]  ED CSW .11/12/2012 13:15pm

## 2012-11-12 NOTE — ED Notes (Signed)
Patient refuses oral temperature measurement.

## 2012-11-12 NOTE — ED Notes (Signed)
Pt will stay for the night and have consult in the am for social work. Family present with pt. [facility] made aware.

## 2012-11-12 NOTE — ED Provider Notes (Addendum)
7:19 AM Awaiting SW consultation for placement vs additional home health support. Labs and vital reviewed. Chronic renal insufficiency at baseline for pt compared to prior BUN/creatinine. Head CT negative. INR 2.9. Will continue home medications while here in the ER. Will continue to observe in ER. Review of records demonstrates hx of dementia. Recent phone calls to PCP to obtain additional home health services. Recent neuro evaluation demonstrates dementia. My suspicion for delirium is low.   ECG interpretation   Date: 11/12/2012  Rate: 95  Rhythm: atrial fibrillation  QRS Axis: normal  Intervals: normal  ST/T Wave abnormalities: normal  Conduction Disutrbances: none  Narrative Interpretation: PVCs  Old EKG Reviewed: No significant changes noted  7:46 AM Seen by Belenda Cruise with SW. Will attempt placement, possible assisted living. PT and OT to evaluate and tx   Lyanne Co, MD 11/12/12 1610  Lyanne Co, MD 11/12/12 [phone]  Lyanne Co, MD 11/12/12 [phone]

## 2012-11-12 NOTE — ED Notes (Signed)
Pt going in and out of bigeminy. MD, Dr. Norlene Campbell aware.  Continue to monitor.

## 2012-11-12 NOTE — ED Provider Notes (Signed)
CSN: [id number]     Arrival date & time 11/11/12  1813 History   First MD Initiated Contact with Patient 11/11/12 1858     Chief Complaint  Patient presents with  . Altered Mental Status   (Consider location/radiation/quality/duration/timing/severity/associated sxs/prior Treatment) HPI Comments: Patient here with daughter who reports that the patient lives by herself (has for the last 7 years) in an independent living apartment.  Daughter reports that over the past week, her mother has become increasingly confused above her norm.  She thinks that she is in [place], [place], and when she has been at her apartment she has become more aggressive because she does not think this is her place.  The daughter is concerned that her mother may wander off and then get lost.  She reports that she is also eating a great deal, thinking that she had not eaten in a while, but believes that she is taking her medication.  She states they have an aide that comes in daily in the morning and assists with ADL's but they are now at the point that they can no longer take care of her.  Patient is a 79 y.o. female presenting with altered mental status. The history is provided by the patient and a relative. The history is limited by the condition of the patient. No language interpreter was used.  Altered Mental Status Presenting symptoms: confusion, disorientation and memory loss   Severity:  Severe Most recent episode:  More than 2 days ago Timing:  Constant Progression:  Worsening Chronicity:  New Context: dementia   Associated symptoms: no abdominal pain, no decreased appetite, no difficulty breathing, no hallucinations, no headaches, no nausea, no slurred speech, no vomiting and no weakness     Past Medical History  Diagnosis Date  . CHF (congestive heart failure)   . Atrial fibrillation     paroxismal  . Hyperlipidemia   . Hypertension   . Anxiety and depression   . Dementia   . Allergic rhinitis   . Gout     Past Surgical History  Procedure Laterality Date  . Abdominal hysterectomy      no oophorectomy   Family History  Problem Relation Age of Onset  . Colon cancer Neg Hx   . Breast cancer Neg Hx   . Diabetes Neg Hx   . Coronary artery disease Neg Hx   . Ataxia Neg Hx   . Chorea Neg Hx   . Mental retardation Neg Hx   . Migraines Neg Hx   . Multiple sclerosis Neg Hx   . Neurofibromatosis Neg Hx   . Neuropathy Neg Hx   . Parkinsonism Neg Hx   . Seizures Neg Hx    History  Substance Use Topics  . Smoking status: Never Smoker   . Smokeless tobacco: Never Used  . Alcohol Use: 0.0 oz/week     Comment: occasionally   OB History   Grav Para Term Preterm Abortions TAB SAB Ect Mult Living                 Review of Systems  Unable to perform ROS: Dementia  Constitutional: Negative for decreased appetite.  Gastrointestinal: Negative for nausea, vomiting and abdominal pain.  Neurological: Negative for weakness and headaches.  Psychiatric/Behavioral: Positive for memory loss and confusion. Negative for hallucinations.    Allergies  Review of patient's allergies indicates no known allergies.  Home Medications   Current Outpatient Rx  Name  Route  Sig  Dispense  Refill  .  acetaminophen (TYLENOL) 500 MG tablet   Oral   Take 1,000 mg by mouth every 6 (six) hours as needed for pain.         . carvedilol (COREG) 6.25 MG tablet      take 1 tablet by mouth twice a day   60 tablet   11   . doxazosin (CARDURA) 8 MG tablet      take 1 tablet by mouth at bedtime   90 tablet   1   . furosemide (LASIX) 40 MG tablet   Oral   Take 1 tablet (40 mg total) by mouth daily.   90 tablet   1   . metolazone (ZAROXOLYN) 2.5 MG tablet      take 1 tablet by mouth once daily   30 tablet   6     TAKE 30 MIN BEFORE AM DOSE OF FUROSEMIDE./CY   . potassium chloride SA (K-DUR,KLOR-CON) 20 MEQ tablet   Oral   Take 1 tablet (20 mEq total) by mouth daily.   90 tablet   3   .  QUEtiapine (SEROQUEL) 25 MG tablet   Oral   Take 25 mg by mouth at bedtime.         Marland Kitchen spironolactone (ALDACTONE) 25 MG tablet   Oral   Take 12.5 mg by mouth every morning.          Marland Kitchen ULORIC 40 MG tablet      take 1 tablet by mouth once daily   30 tablet   6   . warfarin (COUMADIN) 2.5 MG tablet   Oral   Take 2.5-5 mg by mouth daily. Take 1 tablet on Tuesday. Take 2 tablets all other days.          BP 165/83  Pulse 71  Temp(Src) 98.1 F (36.7 C) (Oral)  Resp 16  SpO2 100% Physical Exam  Nursing note and vitals reviewed. Constitutional: She appears well-developed and well-nourished. No distress.  HENT:  Head: Normocephalic and atraumatic.  Right Ear: External ear normal.  Left Ear: External ear normal.  Nose: Nose normal.  Mouth/Throat: Oropharynx is clear and moist. No oropharyngeal exudate.  Eyes: Pupils are equal, round, and reactive to light. No scleral icterus.  Bilateral arcus senilis  Neck: Normal range of motion. Neck supple.  Cardiovascular: Normal rate, regular rhythm and normal heart sounds.  Exam reveals no gallop and no friction rub.   No murmur heard. Pulmonary/Chest: Effort normal and breath sounds normal. No respiratory distress. She has no wheezes. She has no rales. She exhibits no tenderness.  Abdominal: Soft. Bowel sounds are normal. She exhibits no distension. There is no tenderness.  Musculoskeletal: Normal range of motion. She exhibits no edema and no tenderness.  Lymphadenopathy:    She has no cervical adenopathy.  Neurological: She is alert. No cranial nerve deficit. She exhibits normal muscle tone. Coordination normal.  Skin: Skin is warm and dry. No rash noted. No erythema. No pallor.  Psychiatric: She has a normal mood and affect. Her behavior is normal. Judgment and thought content normal.    ED Course  Procedures (including critical care time) Labs Review Labs Reviewed  CBC WITH DIFFERENTIAL - Abnormal; Notable for the following:     Hemoglobin 11.4 (*)    HCT 34.2 (*)    Monocytes Relative 13 (*)    All other components within normal limits  COMPREHENSIVE METABOLIC PANEL - Abnormal; Notable for the following:    Glucose, Bld 112 (*)  BUN 75 (*)    Creatinine, Ser 2.09 (*)    GFR calc non Af Amer 20 (*)    GFR calc Af Amer 23 (*)    All other components within normal limits  URINALYSIS, ROUTINE W REFLEX MICROSCOPIC   Imaging Review Dg Chest 2 View  11/11/2012   CLINICAL DATA:  Confusion. Hypertension. CHF.  EXAM: CHEST  2 VIEW  COMPARISON:  07/16/2007.  FINDINGS: The heart is enlarged but stable. Tortuosity and calcification of the aorta is again demonstrated. The lungs are clear. No edema or effusions. The bony thorax is intact.  IMPRESSION: Stable cardiac enlargement.  No acute pulmonary findings.   Electronically Signed   By: Loralie Champagne M.D.   On: 11/11/2012 20:16   Ct Head Wo Contrast  11/11/2012   CLINICAL DATA:  Altered mental status. Delusional. Confusion.  EXAM: CT HEAD WITHOUT CONTRAST  TECHNIQUE: Contiguous axial images were obtained from the base of the skull through the vertex without intravenous contrast.  COMPARISON:  09/14/2012.  FINDINGS: Chronic paranasal sinus disease is present. This has mixed attenuation in the left maxillary sinus, similar to prior exam. No mass lesion, mass effect, midline shift, hydrocephalus, hemorrhage. No acute territorial cortical ischemia/infarct. Atrophy and chronic ischemic white matter disease is present.Chronic left thalamus lacunar infarct. Posterior right temporal encephalomalacia. Dense intracranial atherosclerosis.  IMPRESSION: Atrophy and chronic ischemic white matter disease without acute intracranial abnormality. Old infarcts. Chronic paranasal sinus disease.   Electronically Signed   By: Andreas Newport M.D.   On: 11/11/2012 19:59   Patient with acute mental status changes - labs here were normal, I have spoken with [facility] with Social Work, she has gotten  information from the patient's daughter and will see her first thing in the morning to begin to work on placement. MDM  Dementia  Patient discussed with Dr. Norlene Campbell who will assume care of the patient.  Daughter remains at bedside and will see Jeanice Lim with Social work in the morning.  Izola Price Marisue Humble, [place] 11/12/12 [phone]

## 2012-11-12 NOTE — Evaluation (Signed)
Physical Therapy Evaluation Patient Details Name: Sara Carlson MRN: [id number] DOB: 1922-08-03 Today's Date: 11/12/2012 Time: 4098-1191 PT Time Calculation (min): 10 min  PT Assessment / Plan / Recommendation History of Present Illness  pt admitted 11/11/12 after fall at home, has H/O Alzheimer's disease. P brought to ED. Now awaiting placement for SNF/ALF.  Clinical Impression  Pt was participatory, steady during ambulation with 1 person and her cane, Intermittent cues for safe use of cane. Pt will benefit from ALF and HHPT to improve functional independence and safety.pt will benefit from PT until admitted to facility.    PT Assessment  Patient needs continued PT services    Follow Up Recommendations   (ALF is appropriate.)    Does the patient have the potential to tolerate intense rehabilitation      Barriers to Discharge Decreased caregiver support      Equipment Recommendations  None recommended by PT    Recommendations for Other Services     Frequency Min 2X/week    Precautions / Restrictions Precautions Precautions: Fall   Pertinent Vitals/Pain No c/o      Mobility  Bed Mobility Bed Mobility: Not assessed Transfers Transfers: Sit to Stand;Stand to Sit Sit to Stand: 5: Supervision;From chair/3-in-1;With upper extremity assist Stand to Sit: 5: Supervision;To chair/3-in-1 Ambulation/Gait Ambulation/Gait Assistance: 4: Min guard Ambulation Distance (Feet): 300 Feet Assistive device: Straight cane Ambulation/Gait Assistance Details: pt steady while ambulating with cane . Pt did not lose her balance at this time. Gait Pattern: Step-through pattern    Exercises     PT Diagnosis: Generalized weakness  PT Problem List: Decreased mobility;Decreased knowledge of use of DME;Decreased safety awareness;Decreased knowledge of precautions;Decreased cognition PT Treatment Interventions: DME instruction;Gait training;Functional mobility training;Therapeutic  activities;Balance training;Therapeutic exercise;Patient/family education     PT Goals(Current goals can be found in the care plan section) Acute Rehab PT Goals PT Goal Formulation: With family Time For Goal Achievement: 11/26/12 Potential to Achieve Goals: Good  Visit Information  Last PT Received On: 11/12/12 Assistance Needed: +1 History of Present Illness: pt admitted 11/11/12 after fall at home, has H/O Alzheimer's disease. P brought to ED. Now awaiting placement for SNF/ALF.       Prior Functioning  Home Living Family/patient expects to be discharged to:: Private residence Living Arrangements: Children Available Help at Discharge: Personal care attendant Type of Home: House Home Access: Stairs to enter Secretary/administrator of Steps: unsure howmany Home Layout: One level Home Equipment: Environmental consultant - 4 wheels;Cane - single point Prior Function Level of Independence: Needs assistance Gait / Transfers Assistance Needed: walks with a cane. ADL's / Homemaking Assistance Needed: has an aide daily for a few hours. Communication Communication: No difficulties    Cognition  Cognition Arousal/Alertness: Awake/alert Behavior During Therapy: WFL for tasks assessed/performed Overall Cognitive Status: History of cognitive impairments - at baseline (pt did state [facility] and in [employer].)    Extremity/Trunk Assessment Upper Extremity Assessment Upper Extremity Assessment: Overall WFL for tasks assessed Lower Extremity Assessment Lower Extremity Assessment: Overall WFL for tasks assessed Cervical / Trunk Assessment Cervical / Trunk Assessment: Normal   Balance Balance Balance Assessed: Yes Dynamic Sitting Balance Dynamic Sitting - Balance Support: No upper extremity supported Dynamic Sitting - Level of Assistance: 5: Stand by assistance Dynamic Sitting - Comments: pt able to don socks in sitting with no assistance, leaned over to pull up her socks.  End of Session PT -  End of Session Activity Tolerance: Patient tolerated treatment well Patient left:  in chair;with family/visitor present Nurse Communication: Mobility status  GP Functional Assessment Tool Used: clinical judgement. Functional Limitation: Mobility: Walking and moving around Mobility: Walking and Moving Around Current Status [phone]): At least 1 percent but less than 20 percent impaired, limited or restricted Mobility: Walking and Moving Around Goal Status [phone]): 0 percent impaired, limited or restricted   Rada Hay 11/12/2012, 10:50 AM Blanchard Kelch PT [phone]

## 2012-11-13 ENCOUNTER — Telehealth: Payer: Self-pay | Admitting: *Deleted

## 2012-11-13 NOTE — Telephone Encounter (Signed)
Received call from patients daughter stating that patient moved into a memory care unit yesterday. Daughter is requesting a letter from PCP stating that patient needs to be in memory care and is no longer able to live in independent living. She needs this so a 30 day notice is not required to move into the memory care unit. Please advise.

## 2012-11-14 ENCOUNTER — Encounter: Payer: Self-pay | Admitting: Internal Medicine

## 2012-11-14 ENCOUNTER — Telehealth: Payer: Self-pay | Admitting: *Deleted

## 2012-11-14 NOTE — Telephone Encounter (Signed)
Letter faxed to [phone]

## 2012-11-14 NOTE — ED Provider Notes (Signed)
Medical screening examination/treatment/procedure(s) were conducted as a shared visit with non-physician practitioner(s) and myself.  I personally evaluated the patient during the encounter Pt here with hx of dementia and worseening to the point where she is no longer safe at home independently.  Daughter feels she needs higher level of care.  Pt is in no distress and labs are stable.  Will discuss with social work.  [name], MD 11/14/12 2032

## 2012-11-14 NOTE — Telephone Encounter (Signed)
Advise patient's daughter, letter is ready.

## 2012-11-15 ENCOUNTER — Telehealth: Payer: Self-pay | Admitting: Internal Medicine

## 2012-11-15 NOTE — Telephone Encounter (Signed)
According to our records she is still on doxazosin 8 mg qd, let them know

## 2012-11-15 NOTE — Telephone Encounter (Signed)
Did not see any notes regarding discontinue of doxazosin. Please advise. DJR

## 2012-11-15 NOTE — Telephone Encounter (Addendum)
Patient daughter called and stated that they just put Sara Carlson in a Memory Care unit. Also they were going over her medications and doxazosin (CARDURA) 8 MG tablet was still on there. Patient daughter states that Dr Drue Novel took her off of that. So the nurses at the Memory Care unit need a phone call stating that she is not taking that medication any longer. Thanks   Facility information [facility] Phone number:[phone]

## 2012-11-16 NOTE — Telephone Encounter (Signed)
Memory care unit notified. DJR

## 2012-11-21 ENCOUNTER — Telehealth: Payer: Self-pay | Admitting: Cardiovascular Disease

## 2012-11-21 NOTE — Telephone Encounter (Signed)
New Problem  Pt states she wants to speak to the nurse about being in memory care and the coumadin clinic. Please advise

## 2012-11-21 NOTE — Telephone Encounter (Signed)
SPOKE WITH  KATHY  AT [facility]  ASSISTED LIVING  PT  WAS  ADMITTED  PER DAUGHTER  AND  FACILITY  CAN  CHECK INR   LABS  ARE DONE  ON TUES    VERBAL ORDER GIVEN FOR PT TO HAVE INR CHECKED ON  TUES 11-27-12  AND WILL  FAX RESULTS ATTN   CHRISITNE PER DR NISHAN WILL FOLLOW INR'S./CY PHONE NUMBER  [phone]

## 2012-11-22 ENCOUNTER — Telehealth: Payer: Self-pay | Admitting: Neurology

## 2012-11-22 NOTE — Telephone Encounter (Signed)
Picked up a call from Ms. Sara Carlson re: Sara Carlson. She has just been placed in the memory care center at [facility]. She wanted to know if they needed to keep the f/u appointment with Dr. Everlena Cooper next week and I told her it was totally her call. She opted to cancel and I told her that if she needed our services in the future to please call. She states she will.

## 2012-11-23 ENCOUNTER — Telehealth: Payer: Self-pay | Admitting: Neurology

## 2012-11-23 NOTE — Telephone Encounter (Signed)
Pt's daughter cancelled pt's follow up appt next week b/c pt is now in memory care / Sherri S.

## 2012-11-27 ENCOUNTER — Encounter: Payer: Self-pay | Admitting: Pharmacist

## 2012-11-27 LAB — PROTIME-INR: INR: 2.1 — AB (ref <=1.1)

## 2012-11-28 ENCOUNTER — Ambulatory Visit: Payer: PRIVATE HEALTH INSURANCE | Admitting: Neurology

## 2012-12-07 ENCOUNTER — Ambulatory Visit (INDEPENDENT_AMBULATORY_CARE_PROVIDER_SITE_OTHER): Payer: PRIVATE HEALTH INSURANCE | Admitting: Pharmacist

## 2012-12-07 DIAGNOSIS — I4891 Unspecified atrial fibrillation: Secondary | ICD-10-CM

## 2012-12-10 ENCOUNTER — Telehealth: Payer: Self-pay | Admitting: Internal Medicine

## 2012-12-10 NOTE — Telephone Encounter (Signed)
Tammy from the patient's living facility called to see if we received a form on Friday that she faxed for social security. She would like [place] to call her back and let her know that we received it.  Ph [phone] Fax [phone]

## 2012-12-18 NOTE — Telephone Encounter (Signed)
Sara Carlson, please look in my ledger, if not found, they need to refax the form

## 2012-12-20 ENCOUNTER — Other Ambulatory Visit: Payer: Self-pay

## 2012-12-25 ENCOUNTER — Telehealth: Payer: Self-pay | Admitting: *Deleted

## 2012-12-25 NOTE — Telephone Encounter (Signed)
12/11/2012  Personal Care Services Request for Services Form dropped of in our office to be completed by [facility].  Billing Form attached and put in Sandra's folder. 12/24/2012  Billing Form returned to my folder without PCS Form attached. 12/25/2012  PCS Form found attached to the back of another Form, not completed.  Re-attached Billing Form and gave to [facility].  bw

## 2012-12-26 NOTE — Telephone Encounter (Signed)
Faxed back. Done DJR

## 2012-12-27 DIAGNOSIS — Z0279 Encounter for issue of other medical certificate: Secondary | ICD-10-CM

## 2012-12-28 ENCOUNTER — Telehealth: Payer: Self-pay | Admitting: Internal Medicine

## 2012-12-28 ENCOUNTER — Ambulatory Visit (INDEPENDENT_AMBULATORY_CARE_PROVIDER_SITE_OTHER): Payer: PRIVATE HEALTH INSURANCE | Admitting: Internal Medicine

## 2012-12-28 DIAGNOSIS — I4891 Unspecified atrial fibrillation: Secondary | ICD-10-CM

## 2012-12-28 MED ORDER — FEBUXOSTAT 40 MG PO TABS: ORAL_TABLET | ORAL | Status: AC

## 2012-12-28 NOTE — Telephone Encounter (Signed)
Nurse from [facility] called and requested a refill for [facility] 40 MG tablet   Pharmacy Rx [facility]  Phone [phone] Fax#[phone]

## 2012-12-28 NOTE — Telephone Encounter (Signed)
Refilled and faxed to pharmacy 

## 2012-12-31 NOTE — Telephone Encounter (Signed)
12/31/2012  Completed PCS form returned to me, faxed to Zenon Mayo at [facility], copy sent to batch, and sent to billing.  bw

## 2013-01-01 ENCOUNTER — Other Ambulatory Visit: Payer: PRIVATE HEALTH INSURANCE

## 2013-01-03 ENCOUNTER — Ambulatory Visit (INDEPENDENT_AMBULATORY_CARE_PROVIDER_SITE_OTHER): Payer: PRIVATE HEALTH INSURANCE | Admitting: Cardiovascular Disease

## 2013-01-03 VITALS — BP 128/70 | HR 72 | Ht 62.0 in | Wt 128.0 lb

## 2013-01-03 DIAGNOSIS — I509 Heart failure, unspecified: Secondary | ICD-10-CM

## 2013-01-03 DIAGNOSIS — E785 Hyperlipidemia, unspecified: Secondary | ICD-10-CM

## 2013-01-03 DIAGNOSIS — I4891 Unspecified atrial fibrillation: Secondary | ICD-10-CM

## 2013-01-03 DIAGNOSIS — I10 Essential (primary) hypertension: Secondary | ICD-10-CM

## 2013-01-03 NOTE — Patient Instructions (Signed)
Your physician wants you to follow-up in:  6 MONTHS WITH DR NISHAN  You will receive a reminder letter in the mail two months in advance. If you don't receive a letter, please call our office to schedule the follow-up appointment. Your physician recommends that you continue on your current medications as directed. Please refer to the Current Medication list given to you today. 

## 2013-01-03 NOTE — Assessment & Plan Note (Signed)
Euvolemic No dyspnea Volume status seems good Discussed low salt diet

## 2013-01-03 NOTE — Assessment & Plan Note (Signed)
Well controlled.  Continue current medications and low sodium Dash type diet.    

## 2013-01-03 NOTE — Progress Notes (Signed)
Patient ID: Sara Carlson, female   DOB: [birth year], 79 y.o.   MRN: [id number] Sara Carlson is seen today for f/U of afib, anticoagulation, MR and pulmonary hypertension. Despite her age she has been a reasonable coumadin candidate still living indep. and with no falls. Her INR's have bee Rx. She has had mild to moderate MR in the past but elevated PA pressures by echo disproportionately high at mid 80s. She has had lower extremity edema likely from pulmonary hypertension and has responded well to Lasix and aldactone. She is at assisted living [name] and [name] takes good care of her. She loves her Snickers candy but has to limit it because of gout.  Started on revatio last visit and seems to be tolerating and doing well. Weight up 4lbs as she was too dry last visit and she feels stronger  Echo 05/04/11  Study Conclusions  - Left ventricle: Inferior wall hypokinesis The cavity size was mildly dilated. The estimated ejection fraction was 40%. - Mitral valve: Moderate regurgitation. - Left atrium: The atrium was moderately dilated. - Carlson ventricle: The cavity size was mildly dilated. - Carlson atrium: The atrium was severely dilated. - Atrial septum: No defect or patent foramen ovale was identified. - Tricuspid valve: Severe regurgitation. - Pulmonary arteries: PA peak pressure: 65mm Hg (S). - Impressions: Severe TR with signs of pulmonary hypetension Impressions:    No at memory care center.  Not falling as much   ROS: Denies fever, malais, weight loss, blurry vision, decreased visual acuity, cough, sputum, SOB, hemoptysis, pleuritic pain, palpitaitons, heartburn, abdominal pain, melena, lower extremity edema, claudication, or rash.  All other systems reviewed and negative  General: Affect appropriate Frail elderly female HEENT: normal Neck supple with no adenopathy JVP normal no bruits no thyromegaly Lungs clear with no wheezing and good diaphragmatic motion Heart:   S1/S2 no murmur, no rub, gallop or click PMI normal Abdomen: benighn, BS positve, no tenderness, no AAA no bruit.  No HSM or HJR Distal pulses intact with no bruits No edema Neuro non-focal Skin warm and dry No muscular weakness   Current Outpatient Prescriptions  Medication Sig Dispense Refill  . acetaminophen (TYLENOL) 500 MG tablet Take 1,000 mg by mouth every 6 (six) hours as needed for pain.      . carvedilol (COREG) 6.25 MG tablet take 1 tablet by mouth twice a day  60 tablet  11  . doxazosin (CARDURA) 8 MG tablet take 1 tablet by mouth at bedtime  90 tablet  1  . febuxostat (ULORIC) 40 MG tablet take 1 tablet by mouth once daily  30 tablet  5  . furosemide (LASIX) 40 MG tablet Take 1 tablet (40 mg total) by mouth daily.  90 tablet  1  . metolazone (ZAROXOLYN) 2.5 MG tablet take 1 tablet by mouth once daily  30 tablet  6  . potassium chloride SA (K-DUR,KLOR-CON) 20 MEQ tablet Take 1 tablet (20 mEq total) by mouth daily.  90 tablet  3  . QUEtiapine (SEROQUEL) 25 MG tablet Take 25 mg by mouth at bedtime.      Marland Kitchen spironolactone (ALDACTONE) 25 MG tablet Take 12.5 mg by mouth every morning.       . warfarin (COUMADIN) 2.5 MG tablet Take 2.5-5 mg by mouth daily. Take 1 tablet on Tuesday. Take 2 tablets all other days.       No current facility-administered medications for this visit.    Allergies  Review of patient's allergies  indicates no known allergies.  Electrocardiogram:  afib nonspecific ST/T wave changes  Assessment and Plan

## 2013-01-03 NOTE — Assessment & Plan Note (Signed)
Cholesterol is at goal.  Continue current dose of statin and diet Rx.  No myalgias or side effects.  F/U  LFT's in 6 months. Lab Results  Component Value Date   LDLCALC 82 08/23/2010

## 2013-01-03 NOTE — Assessment & Plan Note (Signed)
Good rate control Has not fallen but once since last visit Continue coumadin for now

## 2013-01-14 ENCOUNTER — Encounter: Payer: PRIVATE HEALTH INSURANCE | Admitting: Internal Medicine

## 2013-01-14 ENCOUNTER — Encounter (HOSPITAL_COMMUNITY): Payer: Self-pay | Admitting: Emergency Medicine

## 2013-01-14 ENCOUNTER — Telehealth: Payer: Self-pay | Admitting: *Deleted

## 2013-01-14 ENCOUNTER — Emergency Department (HOSPITAL_COMMUNITY)
Admission: EM | Admit: 2013-01-14 | Discharge: 2013-01-14 | Disposition: A | Discharge status: Other Acute Inpatient Hospital | Payer: PRIVATE HEALTH INSURANCE | Attending: Emergency Medicine | Admitting: Emergency Medicine

## 2013-01-14 ENCOUNTER — Emergency Department (HOSPITAL_COMMUNITY): Payer: PRIVATE HEALTH INSURANCE

## 2013-01-14 ENCOUNTER — Telehealth: Payer: Self-pay | Admitting: Cardiovascular Disease

## 2013-01-14 ENCOUNTER — Telehealth: Payer: Self-pay | Admitting: Internal Medicine

## 2013-01-14 DIAGNOSIS — Y9210 Unspecified residential institution as the place of occurrence of the external cause: Secondary | ICD-10-CM | POA: Insufficient documentation

## 2013-01-14 DIAGNOSIS — R5381 Other malaise: Secondary | ICD-10-CM | POA: Insufficient documentation

## 2013-01-14 DIAGNOSIS — I509 Heart failure, unspecified: Secondary | ICD-10-CM | POA: Insufficient documentation

## 2013-01-14 DIAGNOSIS — F411 Generalized anxiety disorder: Secondary | ICD-10-CM | POA: Insufficient documentation

## 2013-01-14 DIAGNOSIS — Z79899 Other long term (current) drug therapy: Secondary | ICD-10-CM | POA: Insufficient documentation

## 2013-01-14 DIAGNOSIS — F0390 Unspecified dementia without behavioral disturbance: Secondary | ICD-10-CM | POA: Insufficient documentation

## 2013-01-14 DIAGNOSIS — F329 Major depressive disorder, single episode, unspecified: Secondary | ICD-10-CM | POA: Insufficient documentation

## 2013-01-14 DIAGNOSIS — S0083XA Contusion of other part of head, initial encounter: Secondary | ICD-10-CM

## 2013-01-14 DIAGNOSIS — Y9301 Activity, walking, marching and hiking: Secondary | ICD-10-CM | POA: Insufficient documentation

## 2013-01-14 DIAGNOSIS — Z7901 Long term (current) use of anticoagulants: Secondary | ICD-10-CM | POA: Insufficient documentation

## 2013-01-14 DIAGNOSIS — M109 Gout, unspecified: Secondary | ICD-10-CM | POA: Insufficient documentation

## 2013-01-14 DIAGNOSIS — W19XXXA Unspecified fall, initial encounter: Secondary | ICD-10-CM | POA: Insufficient documentation

## 2013-01-14 DIAGNOSIS — I10 Essential (primary) hypertension: Secondary | ICD-10-CM | POA: Insufficient documentation

## 2013-01-14 DIAGNOSIS — F29 Unspecified psychosis not due to a substance or known physiological condition: Secondary | ICD-10-CM | POA: Insufficient documentation

## 2013-01-14 DIAGNOSIS — I4891 Unspecified atrial fibrillation: Secondary | ICD-10-CM | POA: Insufficient documentation

## 2013-01-14 DIAGNOSIS — F3289 Other specified depressive episodes: Secondary | ICD-10-CM | POA: Insufficient documentation

## 2013-01-14 DIAGNOSIS — S0003XA Contusion of scalp, initial encounter: Secondary | ICD-10-CM | POA: Insufficient documentation

## 2013-01-14 LAB — BASIC METABOLIC PANEL
CO2: 28 mEq/L (ref 19–32)
Calcium: 9.4 mg/dL (ref 8.4–10.5)
Chloride: 102 mEq/L (ref 96–112)
Creatinine, Ser: 1.61 mg/dL — ABNORMAL HIGH (ref 0.50–1.10)
GFR calc Af Amer: 32 mL/min — ABNORMAL LOW (ref >90)
Glucose, Bld: 81 mg/dL (ref 70–99)
Potassium: 4.4 mEq/L (ref 3.5–5.1)
Sodium: 142 mEq/L (ref 135–145)

## 2013-01-14 LAB — CBC
Hemoglobin: 12.3 g/dL (ref 12.0–15.0)
MCH: 29.4 pg (ref 26.0–34.0)
MCV: 89 fL (ref 78.0–100.0)
RBC: 4.18 MIL/uL (ref 3.87–5.11)

## 2013-01-14 LAB — URINALYSIS, ROUTINE W REFLEX MICROSCOPIC
Glucose, UA: NEGATIVE mg/dL
Hgb urine dipstick: NEGATIVE
Ketones, ur: NEGATIVE mg/dL
Protein, ur: NEGATIVE mg/dL
Specific Gravity, Urine: 1.013 (ref 1.005–1.030)
pH: 6 (ref 5.0–8.0)

## 2013-01-14 LAB — DIFFERENTIAL
Eosinophils Absolute: 0.1 10*3/uL (ref 0.0–0.7)
Eosinophils Relative: 3 % (ref 0–5)
Lymphs Abs: 1.3 10*3/uL (ref 0.7–4.0)
Monocytes Absolute: 0.5 10*3/uL (ref 0.1–1.0)
Monocytes Relative: 14 % — ABNORMAL HIGH (ref 3–12)

## 2013-01-14 LAB — PROTIME-INR: INR: 4.35 — ABNORMAL HIGH (ref 0.00–1.49)

## 2013-01-14 LAB — URINE MICROSCOPIC-ADD ON

## 2013-01-14 NOTE — ED Notes (Signed)
Pt from [facility], c/o un witnessed fall. Per facility, pt was walking to breakfast with walker in hall.Hematoma to left temple, states her left hand hurts a little, pulse intact. Hx of dementia, is at baseline per facility. Pt is on warfarin.

## 2013-01-14 NOTE — ED Notes (Signed)
[facility] contacted for transport 

## 2013-01-14 NOTE — Telephone Encounter (Signed)
PER  DAUGHTER   PT FELL  THIS  AM  IS  PRETTY  BANGED  UP  WILL FORWARD TO DR Eden Emms  FOR REVIEW  RE  STOPPING  COUMADIN .Zack Seal

## 2013-01-14 NOTE — ED Notes (Signed)
ptr has arrived to transport pt

## 2013-01-14 NOTE — Telephone Encounter (Signed)
Stop coumadin

## 2013-01-14 NOTE — ED Provider Notes (Signed)
CSN: [id number]     Arrival date & time 01/14/13  4098 History   First MD Initiated Contact with Patient 01/14/13 [phone]     Chief Complaint  Patient presents with  . Fall    Patient is a 79 y.o. female presenting with fall. The history is provided by the patient, the nursing home and the EMS personnel. History limited by: dementia , confusion.  Fall  Pt does not recall what happened today.  Per reports, she was found on the ground and presumed to have fallen.  Unclear whether she had any loc or a syncopal event.  Pt states she has some pain on the left side of her face but denies any other complaints.  She denies chest pain, shortness of breath, numbness or weakness.  Past Medical History  Diagnosis Date  . CHF (congestive heart failure)   . Atrial fibrillation     paroxismal  . Hyperlipidemia   . Hypertension   . Anxiety and depression   . Dementia   . Allergic rhinitis   . Gout    Past Surgical History  Procedure Laterality Date  . Abdominal hysterectomy      no oophorectomy   Family History  Problem Relation Age of Onset  . Colon cancer Neg Hx   . Breast cancer Neg Hx   . Diabetes Neg Hx   . Coronary artery disease Neg Hx   . Ataxia Neg Hx   . Chorea Neg Hx   . Mental retardation Neg Hx   . Migraines Neg Hx   . Multiple sclerosis Neg Hx   . Neurofibromatosis Neg Hx   . Neuropathy Neg Hx   . Parkinsonism Neg Hx   . Seizures Neg Hx    History  Substance Use Topics  . Smoking status: Never Smoker   . Smokeless tobacco: Never Used  . Alcohol Use: 0.0 oz/week     Comment: occasionally   OB History   Grav Para Term Preterm Abortions TAB SAB Ect Mult Living                 Review of Systems  All other systems reviewed and are negative.    Allergies  Review of patient's allergies indicates no known allergies.  Home Medications   Current Outpatient Rx  Name  Route  Sig  Dispense  Refill  . acetaminophen (TYLENOL) 500 MG tablet   Oral   Take 1,000 mg by  mouth every 6 (six) hours as needed for pain.         . carvedilol (COREG) 6.25 MG tablet      take 1 tablet by mouth twice a day   60 tablet   11   . divalproex (DEPAKOTE SPRINKLE) 125 MG capsule   Oral   Take 125 mg by mouth daily. Takes at 0800         . divalproex (DEPAKOTE SPRINKLE) 125 MG capsule   Oral   Take 250 mg by mouth 2 (two) times daily. Takes at 1200 and 1600         . doxazosin (CARDURA) 8 MG tablet      take 1 tablet by mouth at bedtime   90 tablet   1   . febuxostat (ULORIC) 40 MG tablet      take 1 tablet by mouth once daily   30 tablet   5   . furosemide (LASIX) 40 MG tablet   Oral   Take 1 tablet (40 mg total)  by mouth daily.   90 tablet   1   . LORazepam (ATIVAN) 0.5 MG tablet   Oral   Take 0.5 mg by mouth every 6 (six) hours as needed for anxiety.         . metolazone (ZAROXOLYN) 2.5 MG tablet      take 1 tablet by mouth once daily   30 tablet   6     TAKE 30 MIN BEFORE AM DOSE OF FUROSEMIDE./CY   . potassium chloride SA (K-DUR,KLOR-CON) 20 MEQ tablet   Oral   Take 1 tablet (20 mEq total) by mouth daily.   90 tablet   3   . QUEtiapine (SEROQUEL) 50 MG tablet   Oral   Take 50 mg by mouth at bedtime.         . sertraline (ZOLOFT) 50 MG tablet   Oral   Take 50 mg by mouth daily.         Marland Kitchen spironolactone (ALDACTONE) 25 MG tablet   Oral   Take 12.5 mg by mouth every morning.          . warfarin (COUMADIN) 2.5 MG tablet   Oral   Take 2.5-5 mg by mouth daily. Take 1 tablet on Tuesday. Take 2 tablets all other days.          BP 148/70  Pulse 88  Temp(Src) 97.7 F (36.5 C) (Oral)  Resp 12  SpO2 98% Physical Exam  Nursing note and vitals reviewed. Constitutional: No distress.  HENT:  Head: Normocephalic.  Right Ear: External ear normal.  Left Ear: External ear normal.  hematoma left zygomatic arch region, ttp  Eyes: Conjunctivae are normal. Right eye exhibits no discharge. Left eye exhibits no discharge.  No scleral icterus.  Neck: Neck supple. No tracheal deviation present.  Cardiovascular: Normal rate, regular rhythm and intact distal pulses.   Pulmonary/Chest: Effort normal and breath sounds normal. No stridor. No respiratory distress. She has no wheezes. She has no rales.  Abdominal: Soft. Bowel sounds are normal. She exhibits no distension. There is no tenderness. There is no rebound and no guarding.  Musculoskeletal: She exhibits no edema and no tenderness.       Right hip: Normal. She exhibits normal range of motion and no tenderness.       Left hip: Normal. She exhibits normal range of motion and no tenderness.       Cervical back: Normal.       Thoracic back: Normal.       Lumbar back: Normal.  Neurological: She is alert. She is disoriented (not aware of the date, knows that she is in a hospital this am). No sensory deficit. Cranial nerve deficit:  no facial droop, EOMI, nl speech. She exhibits normal muscle tone. She displays no seizure activity. Coordination normal. GCS eye subscore is 4. GCS verbal subscore is 4. GCS motor subscore is 6.  General weakness but moves all 4 extrem equally   Skin: Skin is warm and dry. No rash noted. She is not diaphoretic.  Psychiatric: She has a normal mood and affect.    ED Course  Procedures (including critical care time) Labs Review Labs Reviewed  CBC - Abnormal; Notable for the following:    WBC 3.9 (*)    All other components within normal limits  DIFFERENTIAL - Abnormal; Notable for the following:    Monocytes Relative 14 (*)    All other components within normal limits  URINALYSIS, ROUTINE W REFLEX MICROSCOPIC - Abnormal;  Notable for the following:    Leukocytes, UA TRACE (*)    All other components within normal limits  BASIC METABOLIC PANEL - Abnormal; Notable for the following:    BUN 62 (*)    Creatinine, Ser 1.61 (*)    GFR calc non Af Amer 27 (*)    GFR calc Af Amer 32 (*)    All other components within normal limits   PROTIME-INR - Abnormal; Notable for the following:    Prothrombin Time 39.9 (*)    INR 4.35 (*)    All other components within normal limits  URINE MICROSCOPIC-ADD ON   Imaging Review Ct Head Wo Contrast  01/14/2013   CLINICAL DATA:  Fall with left facial trauma.  EXAM: CT HEAD WITHOUT CONTRAST  CT MAXILLOFACIAL WITHOUT CONTRAST  CT CERVICAL SPINE WITHOUT CONTRAST  TECHNIQUE: Multidetector CT imaging of the head, cervical spine, and maxillofacial structures were performed using the standard protocol without intravenous contrast. Multiplanar CT image reconstructions of the cervical spine and maxillofacial structures were also generated.  COMPARISON:  09/14/2012  FINDINGS: CT HEAD FINDINGS  Ventricles, cisterns and other CSF spaces are within normal as there is mild age related atrophic change. There is chronic ischemic microvascular disease present. There is no mass, mass effect, shift of midline structures or acute hemorrhage. No evidence of acute infarction. 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 There is moderate calcified atherosclerotic plaque over the cavernous segment of the internal carotid arteries as well as the vertebral arteries. There is evidence of prior sinus surgery and continued chronic sinus inflammatory disease with air-fluid levels over the maxillary sinuses as cannot exclude a acute component to patient's sinus disease.  CT MAXILLOFACIAL FINDINGS  There is focal swelling with small hematoma over the left temporal and lateral facial region. Orbits are normal and symmetric. There is moderate opacification over the paranasal sinuses with subtle air-fluid levels over the maxillary sinuses. These findings were present on the prior exam compatible with chronic sinus inflammatory disease. 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 There is chronic deformity of the medial wall of the right orbit. There is no acute  fracture. Minimal degenerative change of the temporomandibular joints. Remainder the exam is unchanged.  CT CERVICAL SPINE FINDINGS  Vertebral body and heights are within normal. There is mild spondylosis present. There is disc space narrowing at the C5-6 level. Prevertebral soft tissues as well as the atlantoaxial articulation are within normal. There is no acute fracture or subluxation. There is uncovertebral joint spurring and mild facet arthropathy. Neural foraminal narrowing is present at multiple levels. Remainder the exam is unchanged to include moderate heterogeneity with multiple hypodense thyroid nodules likely a goiter.  IMPRESSION: No acute intracranial findings.  No acute cervical spine injury.  Soft tissue swelling/hematoma over the lateral left mid face and temporal region. No acute fracture.  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  Moderate chronic sinus inflammatory disease.  Spondylosis of the cervical spine with disc disease at the C5-6 level  Stable multinodular thyroid likely goiter.   Electronically Signed   By: Elberta Fortis M.D.   On: 01/14/2013 11:03   Ct Cervical Spine Wo Contrast  01/14/2013   CLINICAL DATA:  Fall with left facial trauma.  EXAM: CT HEAD WITHOUT CONTRAST  CT MAXILLOFACIAL WITHOUT CONTRAST  CT CERVICAL SPINE WITHOUT CONTRAST  TECHNIQUE: Multidetector CT imaging  of the head, cervical spine, and maxillofacial structures were performed using the standard protocol without intravenous contrast. Multiplanar CT image reconstructions of the cervical spine and maxillofacial structures were also generated.  COMPARISON:  09/14/2012  FINDINGS: CT HEAD FINDINGS  Ventricles, cisterns and other CSF spaces are within normal as there is mild age related atrophic change. There is chronic ischemic microvascular disease present. There is no mass, mass effect, shift of midline structures or acute  hemorrhage. No evidence of acute infarction. 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 There is moderate calcified atherosclerotic plaque over the cavernous segment of the internal carotid arteries as well as the vertebral arteries. There is evidence of prior sinus surgery and continued chronic sinus inflammatory disease with air-fluid levels over the maxillary sinuses as cannot exclude a acute component to patient's sinus disease.  CT MAXILLOFACIAL FINDINGS  There is focal swelling with small hematoma over the left temporal and lateral facial region. Orbits are normal and symmetric. There is moderate opacification over the paranasal sinuses with subtle air-fluid levels over the maxillary sinuses. These findings were present on the prior exam compatible with chronic sinus inflammatory disease. 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 There is chronic deformity of the medial wall of the right orbit. There is no acute fracture. Minimal degenerative change of the temporomandibular joints. Remainder the exam is unchanged.  CT CERVICAL SPINE FINDINGS  Vertebral body and heights are within normal. There is mild spondylosis present. There is disc space narrowing at the C5-6 level. Prevertebral soft tissues as well as the atlantoaxial articulation are within normal. There is no acute fracture or subluxation. There is uncovertebral joint spurring and mild facet arthropathy. Neural foraminal narrowing is present at multiple levels. Remainder the exam is unchanged to include moderate heterogeneity with multiple hypodense thyroid nodules likely a goiter.  IMPRESSION: No acute intracranial findings.  No acute cervical spine injury.  Soft tissue swelling/hematoma over the lateral left mid face and temporal region. No acute fracture.  Chronic ischemic microvascular disease and age related atrophic change as well as old small right posterior temporal  infarct.  Moderate chronic sinus inflammatory disease.  Spondylosis of the cervical spine with disc disease at the C5-6 level  Stable multinodular thyroid likely goiter.   Electronically Signed   By: Elberta Fortis M.D.   On: 01/14/2013 11:03   Ct Maxillofacial Wo Cm  01/14/2013   CLINICAL DATA:  Fall with left facial trauma.  EXAM: CT HEAD WITHOUT CONTRAST  CT MAXILLOFACIAL WITHOUT CONTRAST  CT CERVICAL SPINE WITHOUT CONTRAST  TECHNIQUE: Multidetector CT imaging of the head, cervical spine, and maxillofacial structures were performed using the standard protocol without intravenous contrast. Multiplanar CT image reconstructions of the cervical spine and maxillofacial structures were also generated.  COMPARISON:  09/14/2012  FINDINGS: CT HEAD FINDINGS  Ventricles, cisterns and other CSF spaces are within normal as there is mild age related atrophic change. There is chronic ischemic microvascular disease present. There is no mass, mass effect, shift of midline structures or acute hemorrhage. No evidence of acute infarction. 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 There is moderate calcified atherosclerotic plaque over the cavernous segment of the internal carotid arteries as well as the vertebral arteries. There is evidence of prior sinus surgery and continued chronic sinus inflammatory disease with air-fluid levels over the maxillary sinuses as cannot exclude a acute component to patient's sinus disease.  CT MAXILLOFACIAL FINDINGS  There  is focal swelling with small hematoma over the left temporal and lateral facial region. Orbits are normal and symmetric. There is moderate opacification over the paranasal sinuses with subtle air-fluid levels over the maxillary sinuses. These findings were present on the prior exam compatible with chronic sinus inflammatory disease. 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 There is chronic deformity of  the medial wall of the right orbit. There is no acute fracture. Minimal degenerative change of the temporomandibular joints. Remainder the exam is unchanged.  CT CERVICAL SPINE FINDINGS  Vertebral body and heights are within normal. There is mild spondylosis present. There is disc space narrowing at the C5-6 level. Prevertebral soft tissues as well as the atlantoaxial articulation are within normal. There is no acute fracture or subluxation. There is uncovertebral joint spurring and mild facet arthropathy. Neural foraminal narrowing is present at multiple levels. Remainder the exam is unchanged to include moderate heterogeneity with multiple hypodense thyroid nodules likely a goiter.  IMPRESSION: No acute intracranial findings.  No acute cervical spine injury.  Soft tissue swelling/hematoma over the lateral left mid face and temporal region. No acute fracture.  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  Moderate chronic sinus inflammatory disease.  Spondylosis of the cervical spine with disc disease at the C5-6 level  Stable multinodular thyroid likely goiter.   Electronically Signed   By: Elberta Fortis M.D.   On: 01/14/2013 11:03    EKG Interpretation    Date/Time:  Monday January 14 2013 10:38:07 EST Ventricular Rate:  70 PR Interval:    QRS Duration: 86 QT Interval:  409 QTC Calculation: 441 R Axis:   97 Text Interpretation:  atrial fibrillation Borderline right axis deviation Borderline low voltage, extremity leads Consider anterior infarct Borderline repolarization abnormality poor quality, redo Confirmed by Aariyana Manz  MD-J, Magenta Schmiesing ([id number]) on 01/14/2013 10:44:56 AM            MDM   1. Facial hematoma, initial encounter    Labs and xrays are reassuring.  Patient has chronic renal insufficiency but this has not significantly changed. The CT scan does not show evidence of facial fracture, cervical spine fracture,  or intracranial bleeding.  Cannot exclude a syncopal episode but she most likely had a mechanical fall.  Patient's INR is supratherapeutic.  I've discussed the findings with the patient and her daughter. Daughter states that she has  decided to have her mother discontinue taking Coumadin. The patient's daughter has already left a message with her cardiologist. I think this is reasonable considering her fall risk.  At this time there does not appear to be any evidence of an acute emergency medical condition and the patient appears stable for discharge with appropriate outpatient follow up.    Celene Kras, MD 01/14/13 [phone]

## 2013-01-14 NOTE — Telephone Encounter (Signed)
New message  Patients daughter called in to ask about stopping the coumadin? Her mother has fell again. Please call and advise.

## 2013-01-14 NOTE — Telephone Encounter (Signed)
Patient's daughter is calling to notify Dr. Drue Novel that Dr. Eden Emms is taking the patient off of her Comadin. She states that Dr. Eden Emms had told them that if the patient fell again he would take her off. They are at the ER now because she fell. No call back requested.

## 2013-01-14 NOTE — Telephone Encounter (Signed)
Thanks.  JP

## 2013-01-14 NOTE — Telephone Encounter (Signed)
12/26/2012 Received fax from [facility], Physician's/Medical Officer's Statement of Patient's Capability to Manage Benefits.  01/14/2013 Faxed completed paperwork to [facility] (fax [phone]).  Copy sent to batch, no charge per PAZ.   bw

## 2013-01-15 ENCOUNTER — Encounter (HOSPITAL_COMMUNITY): Admission: EM | Disposition: E | Payer: Self-pay | Source: Home / Self Care | Attending: Neurosurgery

## 2013-01-15 ENCOUNTER — Emergency Department (HOSPITAL_COMMUNITY): Payer: PRIVATE HEALTH INSURANCE

## 2013-01-15 ENCOUNTER — Inpatient Hospital Stay (HOSPITAL_COMMUNITY): Payer: PRIVATE HEALTH INSURANCE

## 2013-01-15 ENCOUNTER — Emergency Department (HOSPITAL_COMMUNITY): Payer: PRIVATE HEALTH INSURANCE | Admitting: Anesthesiology

## 2013-01-15 ENCOUNTER — Inpatient Hospital Stay (HOSPITAL_COMMUNITY)
Admission: EM | Admit: 2013-01-15 | Discharge: 2013-02-14 | DRG: 025 | Disposition: E | Payer: PRIVATE HEALTH INSURANCE | Attending: Neurosurgery | Admitting: Neurosurgery

## 2013-01-15 ENCOUNTER — Encounter (HOSPITAL_COMMUNITY): Payer: PRIVATE HEALTH INSURANCE | Admitting: Anesthesiology

## 2013-01-15 ENCOUNTER — Encounter (HOSPITAL_COMMUNITY): Payer: Self-pay | Admitting: Emergency Medicine

## 2013-01-15 DIAGNOSIS — F0392 Unspecified dementia, unspecified severity, with psychotic disturbance: Secondary | ICD-10-CM

## 2013-01-15 DIAGNOSIS — R4182 Altered mental status, unspecified: Secondary | ICD-10-CM

## 2013-01-15 DIAGNOSIS — E46 Unspecified protein-calorie malnutrition: Secondary | ICD-10-CM | POA: Diagnosis present

## 2013-01-15 DIAGNOSIS — R791 Abnormal coagulation profile: Secondary | ICD-10-CM | POA: Diagnosis present

## 2013-01-15 DIAGNOSIS — I10 Essential (primary) hypertension: Secondary | ICD-10-CM | POA: Diagnosis present

## 2013-01-15 DIAGNOSIS — I272 Pulmonary hypertension, unspecified: Secondary | ICD-10-CM

## 2013-01-15 DIAGNOSIS — I4891 Unspecified atrial fibrillation: Secondary | ICD-10-CM | POA: Diagnosis present

## 2013-01-15 DIAGNOSIS — N179 Acute kidney failure, unspecified: Secondary | ICD-10-CM | POA: Diagnosis present

## 2013-01-15 DIAGNOSIS — I6200 Nontraumatic subdural hemorrhage, unspecified: Secondary | ICD-10-CM

## 2013-01-15 DIAGNOSIS — G936 Cerebral edema: Secondary | ICD-10-CM | POA: Diagnosis present

## 2013-01-15 DIAGNOSIS — Y9210 Unspecified residential institution as the place of occurrence of the external cause: Secondary | ICD-10-CM | POA: Diagnosis present

## 2013-01-15 DIAGNOSIS — S065XAA Traumatic subdural hemorrhage with loss of consciousness status unknown, initial encounter: Principal | ICD-10-CM | POA: Diagnosis present

## 2013-01-15 DIAGNOSIS — F0390 Unspecified dementia without behavioral disturbance: Secondary | ICD-10-CM | POA: Diagnosis present

## 2013-01-15 DIAGNOSIS — W19XXXA Unspecified fall, initial encounter: Secondary | ICD-10-CM | POA: Diagnosis present

## 2013-01-15 DIAGNOSIS — Z515 Encounter for palliative care: Secondary | ICD-10-CM

## 2013-01-15 DIAGNOSIS — E870 Hyperosmolality and hypernatremia: Secondary | ICD-10-CM | POA: Diagnosis present

## 2013-01-15 DIAGNOSIS — E785 Hyperlipidemia, unspecified: Secondary | ICD-10-CM | POA: Diagnosis present

## 2013-01-15 DIAGNOSIS — R4020 Unspecified coma: Secondary | ICD-10-CM | POA: Diagnosis not present

## 2013-01-15 DIAGNOSIS — S52599A Other fractures of lower end of unspecified radius, initial encounter for closed fracture: Secondary | ICD-10-CM | POA: Diagnosis present

## 2013-01-15 DIAGNOSIS — D649 Anemia, unspecified: Secondary | ICD-10-CM | POA: Diagnosis present

## 2013-01-15 DIAGNOSIS — I509 Heart failure, unspecified: Secondary | ICD-10-CM | POA: Diagnosis present

## 2013-01-15 DIAGNOSIS — S52502A Unspecified fracture of the lower end of left radius, initial encounter for closed fracture: Secondary | ICD-10-CM

## 2013-01-15 DIAGNOSIS — F0391 Unspecified dementia with behavioral disturbance: Secondary | ICD-10-CM

## 2013-01-15 DIAGNOSIS — S065X9A Traumatic subdural hemorrhage with loss of consciousness of unspecified duration, initial encounter: Secondary | ICD-10-CM | POA: Diagnosis present

## 2013-01-15 DIAGNOSIS — Z7901 Long term (current) use of anticoagulants: Secondary | ICD-10-CM

## 2013-01-15 DIAGNOSIS — J9600 Acute respiratory failure, unspecified whether with hypoxia or hypercapnia: Secondary | ICD-10-CM

## 2013-01-15 HISTORY — PX: CRANIOTOMY: SHX93

## 2013-01-15 LAB — CBC WITH DIFFERENTIAL/PLATELET
Basophils Absolute: 0 10*3/uL (ref 0.0–0.1)
Basophils Relative: 0 % (ref 0–1)
Eosinophils Absolute: 0 10*3/uL (ref 0.0–0.7)
HCT: 31.2 % — ABNORMAL LOW (ref 36.0–46.0)
Hemoglobin: 10.3 g/dL — ABNORMAL LOW (ref 12.0–15.0)
Lymphocytes Relative: 6 % — ABNORMAL LOW (ref 12–46)
MCH: 29.4 pg (ref 26.0–34.0)
MCHC: 33 g/dL (ref 30.0–36.0)
Monocytes Absolute: 1.3 10*3/uL — ABNORMAL HIGH (ref 0.1–1.0)
Monocytes Relative: 17 % — ABNORMAL HIGH (ref 3–12)
Neutro Abs: 5.9 10*3/uL (ref 1.7–7.7)
Platelets: 190 10*3/uL (ref 150–400)
RDW: 15.4 % (ref 11.5–15.5)
WBC: 7.7 10*3/uL (ref 4.0–10.5)

## 2013-01-15 LAB — POCT I-STAT 3, ART BLOOD GAS (G3+)
Bicarbonate: 23.2 mEq/L (ref 20.0–24.0)
O2 Saturation: 94 %
Patient temperature: 97.1
TCO2: 24 mmol/L (ref 0–100)
pH, Arterial: 7.465 — ABNORMAL HIGH (ref 7.350–7.450)

## 2013-01-15 LAB — PREPARE RBC (CROSSMATCH)

## 2013-01-15 LAB — COMPREHENSIVE METABOLIC PANEL
Albumin: 3.3 g/dL — ABNORMAL LOW (ref 3.5–5.2)
BUN: 54 mg/dL — ABNORMAL HIGH (ref 6–23)
Calcium: 9.2 mg/dL (ref 8.4–10.5)
Creatinine, Ser: 1.47 mg/dL — ABNORMAL HIGH (ref 0.50–1.10)
Potassium: 3.5 mEq/L (ref 3.5–5.1)
Total Bilirubin: 0.5 mg/dL (ref 0.3–1.2)
Total Protein: 6.8 g/dL (ref 6.0–8.3)

## 2013-01-15 LAB — PROTIME-INR
INR: 4.97 — ABNORMAL HIGH (ref 0.00–1.49)
INR: 1.51 — ABNORMAL HIGH (ref 0.00–1.49)
INR: 1.34 (ref 0.00–1.49)
Prothrombin Time: 45 seconds — ABNORMAL HIGH (ref 11.6–15.2)
Prothrombin Time: 17.8 seconds — ABNORMAL HIGH (ref 11.6–15.2)

## 2013-01-15 LAB — URINE MICROSCOPIC-ADD ON

## 2013-01-15 LAB — URINALYSIS, ROUTINE W REFLEX MICROSCOPIC
Bilirubin Urine: NEGATIVE
Ketones, ur: NEGATIVE mg/dL
Nitrite: NEGATIVE
Specific Gravity, Urine: 1.017 (ref 1.005–1.030)
Urobilinogen, UA: 0.2 mg/dL (ref 0.0–1.0)

## 2013-01-15 LAB — ABO/RH: ABO/RH(D): O POS

## 2013-01-15 SURGERY — CRANIOTOMY HEMATOMA EVACUATION SUBDURAL
Anesthesia: General | Laterality: Left

## 2013-01-15 MED ORDER — LIDOCAINE HCL (CARDIAC) 20 MG/ML IV SOLN
INTRAVENOUS | Status: DC | PRN
  Administered 2013-01-15: 40 mg via INTRAVENOUS

## 2013-01-15 MED ORDER — DOXAZOSIN MESYLATE 8 MG PO TABS
8.0000 mg | ORAL_TABLET | Freq: Every day | ORAL | Status: DC
  Administered 2013-01-15 – 2013-01-19 (×5): 8 mg via ORAL
  Filled 2013-01-15 (×5): qty 1

## 2013-01-15 MED ORDER — DIVALPROEX SODIUM 125 MG PO CPSP: 125.0000 mg | ORAL_CAPSULE | Freq: Three times a day (TID) | ORAL | Status: DC

## 2013-01-15 MED ORDER — PROTHROMBIN COMPLEX CONC HUMAN 500 UNITS IV KIT
2218.0000 [IU] | PACK | Freq: Once | Status: DC
  Filled 2013-01-15: qty 89

## 2013-01-15 MED ORDER — CEFAZOLIN SODIUM-DEXTROSE 2-3 GM-% IV SOLR
INTRAVENOUS | Status: DC | PRN
  Administered 2013-01-15: 2 g via INTRAVENOUS

## 2013-01-15 MED ORDER — 0.9 % SODIUM CHLORIDE (POUR BTL) OPTIME
TOPICAL | Status: DC | PRN
  Administered 2013-01-15 (×9): 1000 mL

## 2013-01-15 MED ORDER — SPIRONOLACTONE 12.5 MG HALF TABLET
12.5000 mg | ORAL_TABLET | Freq: Every morning | ORAL | Status: DC
  Administered 2013-01-16: 12.5 mg via ORAL
  Filled 2013-01-15: qty 1

## 2013-01-15 MED ORDER — LIDOCAINE-EPINEPHRINE 1 %-1:100000 IJ SOLN
INTRAMUSCULAR | Status: DC | PRN
  Administered 2013-01-15: 10 mL

## 2013-01-15 MED ORDER — SODIUM CHLORIDE 0.9 % IV SOLN
INTRAVENOUS | Status: DC | PRN
  Administered 2013-01-15: 13:00:00 via INTRAVENOUS

## 2013-01-15 MED ORDER — POTASSIUM CHLORIDE 20 MEQ/15ML (10%) PO LIQD
20.0000 meq | Freq: Once | ORAL | Status: AC
  Administered 2013-01-15: 20 meq via ORAL
  Filled 2013-01-15: qty 15

## 2013-01-15 MED ORDER — HYDRALAZINE HCL 20 MG/ML IJ SOLN: 5.0000 mg | INTRAMUSCULAR | Status: DC | PRN

## 2013-01-15 MED ORDER — PANTOPRAZOLE SODIUM 40 MG IV SOLR
40.0000 mg | Freq: Every day | INTRAVENOUS | Status: DC
  Administered 2013-01-15 – 2013-01-19 (×5): 40 mg via INTRAVENOUS
  Filled 2013-01-15 (×7): qty 40

## 2013-01-15 MED ORDER — ONDANSETRON HCL 4 MG PO TABS: 4.0000 mg | ORAL_TABLET | ORAL | Status: DC | PRN

## 2013-01-15 MED ORDER — PROTHROMBIN COMPLEX CONC HUMAN 500 UNITS IV KIT: 35.0000 [IU]/kg | PACK | Status: DC

## 2013-01-15 MED ORDER — LABETALOL HCL 5 MG/ML IV SOLN: 5.0000 mg | INTRAVENOUS | Status: DC | PRN

## 2013-01-15 MED ORDER — VITAMIN K1 10 MG/ML IJ SOLN
10.0000 mg | Freq: Once | INTRAVENOUS | Status: AC
  Administered 2013-01-15: 10 mg via INTRAVENOUS
  Filled 2013-01-15: qty 1

## 2013-01-15 MED ORDER — PROMETHAZINE HCL 25 MG PO TABS: 12.5000 mg | ORAL_TABLET | ORAL | Status: DC | PRN

## 2013-01-15 MED ORDER — SODIUM CHLORIDE 0.9 % IV SOLN
25.0000 ug/h | INTRAVENOUS | Status: DC
  Administered 2013-01-15 – 2013-01-19 (×3): 25 ug/h via INTRAVENOUS
  Filled 2013-01-15 (×3): qty 50

## 2013-01-15 MED ORDER — NICARDIPINE HCL IN NACL 20-0.86 MG/200ML-% IV SOLN
5.0000 mg/h | INTRAVENOUS | Status: DC
  Administered 2013-01-15: 6 mg/h via INTRAVENOUS
  Filled 2013-01-15: qty 200

## 2013-01-15 MED ORDER — VECURONIUM BROMIDE 10 MG IV SOLR
INTRAVENOUS | Status: DC | PRN
  Administered 2013-01-15: 5 mg via INTRAVENOUS

## 2013-01-15 MED ORDER — BUPIVACAINE HCL 0.5 % IJ SOLN
INTRAMUSCULAR | Status: DC | PRN
  Administered 2013-01-15: 10 mL

## 2013-01-15 MED ORDER — DIVALPROEX SODIUM 125 MG PO CPSP
125.0000 mg | ORAL_CAPSULE | ORAL | Status: DC
  Administered 2013-01-16 – 2013-01-20 (×5): 125 mg via ORAL
  Filled 2013-01-15 (×6): qty 1

## 2013-01-15 MED ORDER — FENTANYL CITRATE 0.05 MG/ML IJ SOLN
INTRAMUSCULAR | Status: DC | PRN
  Administered 2013-01-15 (×2): 100 ug via INTRAVENOUS
  Administered 2013-01-15: 50 ug via INTRAVENOUS

## 2013-01-15 MED ORDER — POTASSIUM CHLORIDE CRYS ER 20 MEQ PO TBCR
20.0000 meq | EXTENDED_RELEASE_TABLET | Freq: Every day | ORAL | Status: DC
  Filled 2013-01-15: qty 1

## 2013-01-15 MED ORDER — ROCURONIUM BROMIDE 100 MG/10ML IV SOLN
INTRAVENOUS | Status: DC | PRN
  Administered 2013-01-15: 50 mg via INTRAVENOUS

## 2013-01-15 MED ORDER — QUETIAPINE FUMARATE 50 MG PO TABS
50.0000 mg | ORAL_TABLET | Freq: Every day | ORAL | Status: DC
  Administered 2013-01-15 – 2013-01-18 (×4): 50 mg via ORAL
  Filled 2013-01-15 (×5): qty 1

## 2013-01-15 MED ORDER — PROPOFOL 10 MG/ML IV EMUL: 5.0000 ug/kg/min | INTRAVENOUS | Status: DC

## 2013-01-15 MED ORDER — BACITRACIN ZINC 500 UNIT/GM EX OINT
TOPICAL_OINTMENT | CUTANEOUS | Status: DC | PRN
  Administered 2013-01-15: 1 via TOPICAL

## 2013-01-15 MED ORDER — FUROSEMIDE 40 MG PO TABS
40.0000 mg | ORAL_TABLET | Freq: Every day | ORAL | Status: DC
  Administered 2013-01-15 – 2013-01-16 (×2): 40 mg via ORAL
  Filled 2013-01-15 (×2): qty 1

## 2013-01-15 MED ORDER — SERTRALINE HCL 50 MG PO TABS
50.0000 mg | ORAL_TABLET | Freq: Every day | ORAL | Status: DC
  Administered 2013-01-16 – 2013-01-19 (×4): 50 mg via ORAL
  Filled 2013-01-15 (×4): qty 1

## 2013-01-15 MED ORDER — FEBUXOSTAT 40 MG PO TABS
40.0000 mg | ORAL_TABLET | Freq: Every day | ORAL | Status: DC
  Administered 2013-01-16 – 2013-01-20 (×5): 40 mg via ORAL
  Filled 2013-01-15 (×5): qty 1

## 2013-01-15 MED ORDER — LABETALOL HCL 5 MG/ML IV SOLN
10.0000 mg | INTRAVENOUS | Status: DC | PRN
  Administered 2013-01-15 (×2): 10 mg via INTRAVENOUS
  Filled 2013-01-15: qty 4

## 2013-01-15 MED ORDER — ONDANSETRON HCL 4 MG/2ML IJ SOLN: 4.0000 mg | INTRAMUSCULAR | Status: DC | PRN

## 2013-01-15 MED ORDER — CEFAZOLIN SODIUM 1-5 GM-% IV SOLN
INTRAVENOUS | Status: AC
  Filled 2013-01-15: qty 100

## 2013-01-15 MED ORDER — METOLAZONE 2.5 MG PO TABS
2.5000 mg | ORAL_TABLET | Freq: Every day | ORAL | Status: DC
  Administered 2013-01-16: 2.5 mg via ORAL
  Filled 2013-01-15: qty 1

## 2013-01-15 MED ORDER — LABETALOL HCL 5 MG/ML IV SOLN
INTRAVENOUS | Status: DC | PRN
  Administered 2013-01-15 (×2): 5 mg via INTRAVENOUS

## 2013-01-15 MED ORDER — SODIUM CHLORIDE 0.9 % IV SOLN
500.0000 mg | Freq: Two times a day (BID) | INTRAVENOUS | Status: DC
  Administered 2013-01-15 – 2013-01-22 (×14): 500 mg via INTRAVENOUS
  Filled 2013-01-15 (×18): qty 5

## 2013-01-15 MED ORDER — PROPOFOL 10 MG/ML IV BOLUS
INTRAVENOUS | Status: DC | PRN
  Administered 2013-01-15: 50 mg via INTRAVENOUS

## 2013-01-15 MED ORDER — DIVALPROEX SODIUM 125 MG PO CPSP
250.0000 mg | ORAL_CAPSULE | ORAL | Status: DC
  Administered 2013-01-15 – 2013-01-20 (×10): 250 mg via ORAL
  Filled 2013-01-15 (×11): qty 2

## 2013-01-15 MED ORDER — THROMBIN 20000 UNITS EX SOLR
CUTANEOUS | Status: DC | PRN
  Administered 2013-01-15: 14:00:00 via TOPICAL

## 2013-01-15 MED ORDER — CARVEDILOL 6.25 MG PO TABS
6.2500 mg | ORAL_TABLET | Freq: Two times a day (BID) | ORAL | Status: DC
  Administered 2013-01-15 – 2013-01-19 (×7): 6.25 mg via ORAL
  Filled 2013-01-15 (×10): qty 1

## 2013-01-15 MED ORDER — VITAMIN K1 10 MG/ML IJ SOLN
10.0000 mg | INTRAVENOUS | Status: AC
  Filled 2013-01-15: qty 1

## 2013-01-15 MED ORDER — LORAZEPAM 0.5 MG PO TABS: 0.5000 mg | ORAL_TABLET | Freq: Four times a day (QID) | ORAL | Status: DC | PRN

## 2013-01-15 MED ORDER — PROPOFOL 10 MG/ML IV EMUL
5.0000 ug/kg/min | INTRAVENOUS | Status: DC
  Administered 2013-01-15: 5 ug/kg/min via INTRAVENOUS
  Filled 2013-01-15: qty 100

## 2013-01-15 MED ORDER — ACETAMINOPHEN 500 MG PO TABS: 1000.0000 mg | ORAL_TABLET | Freq: Four times a day (QID) | ORAL | Status: DC | PRN

## 2013-01-15 SURGICAL SUPPLY — 76 items
APL SKNCLS STERI-STRIP NONHPOA (GAUZE/BANDAGES/DRESSINGS)
BANDAGE GAUZE 4  KLING STR (GAUZE/BANDAGES/DRESSINGS) IMPLANT
BANDAGE GAUZE ELAST BULKY 4 IN (GAUZE/BANDAGES/DRESSINGS) IMPLANT
BENZOIN TINCTURE PRP APPL 2/3 (GAUZE/BANDAGES/DRESSINGS) IMPLANT
BIT DRILL WIRE PASS 1.3MM (BIT) IMPLANT
BRUSH SCRUB EZ 1% IODOPHOR (MISCELLANEOUS) ×2 IMPLANT
BRUSH SCRUB EZ PLAIN DRY (MISCELLANEOUS) ×2 IMPLANT
BUR ACORN 6.0 PRECISION (BURR) ×2 IMPLANT
BUR ROUTER D-58 CRANI (BURR) IMPLANT
CANISTER SUCT 3000ML (MISCELLANEOUS) ×2 IMPLANT
CATH ROBINSON RED A/P 12FR (CATHETERS) IMPLANT
CLIP TI MEDIUM 6 (CLIP) IMPLANT
CONT SPEC 4OZ CLIKSEAL STRL BL (MISCELLANEOUS) ×2 IMPLANT
CORDS BIPOLAR (ELECTRODE) ×2 IMPLANT
DRAIN CHANNEL 10M FLAT 3/4 FLT (DRAIN) ×1 IMPLANT
DRAIN PENROSE 1/2X12 LTX STRL (WOUND CARE) IMPLANT
DRAIN SNY WOU 7FLT (WOUND CARE) IMPLANT
DRAPE NEUROLOGICAL W/INCISE (DRAPES) ×2 IMPLANT
DRAPE SURG IRRIG POUCH 19X23 (DRAPES) IMPLANT
DRAPE WARM FLUID 44X44 (DRAPE) ×2 IMPLANT
DRESSING TELFA 8X3 (GAUZE/BANDAGES/DRESSINGS) ×2 IMPLANT
DRILL WIRE PASS 1.3MM (BIT)
DRSG OPSITE 4X5.5 SM (GAUZE/BANDAGES/DRESSINGS) ×4 IMPLANT
DRSG PAD ABDOMINAL 8X10 ST (GAUZE/BANDAGES/DRESSINGS) IMPLANT
DURAPREP 6ML APPLICATOR 50/CS (WOUND CARE) ×2 IMPLANT
ELECT CAUTERY BLADE 6.4 (BLADE) ×2 IMPLANT
ELECT REM PT RETURN 9FT ADLT (ELECTROSURGICAL) ×2
ELECTRODE REM PT RTRN 9FT ADLT (ELECTROSURGICAL) ×1 IMPLANT
EVACUATOR SILICONE 100CC (DRAIN) ×1 IMPLANT
GAUZE SPONGE 4X4 16PLY XRAY LF (GAUZE/BANDAGES/DRESSINGS) IMPLANT
GLOVE BIO SURGEON STRL SZ8 (GLOVE) ×2 IMPLANT
GLOVE BIOGEL PI IND STRL 8 (GLOVE) ×1 IMPLANT
GLOVE BIOGEL PI IND STRL 8.5 (GLOVE) ×1 IMPLANT
GLOVE BIOGEL PI INDICATOR 8 (GLOVE) ×1
GLOVE BIOGEL PI INDICATOR 8.5 (GLOVE) ×1
GLOVE ECLIPSE 7.5 STRL STRAW (GLOVE) ×2 IMPLANT
GLOVE EXAM NITRILE LRG STRL (GLOVE) IMPLANT
GLOVE EXAM NITRILE MD LF STRL (GLOVE) IMPLANT
GLOVE EXAM NITRILE XL STR (GLOVE) IMPLANT
GLOVE EXAM NITRILE XS STR PU (GLOVE) IMPLANT
GOWN BRE IMP SLV AUR LG STRL (GOWN DISPOSABLE) IMPLANT
GOWN BRE IMP SLV AUR XL STRL (GOWN DISPOSABLE) IMPLANT
GOWN STRL REIN 2XL LVL4 (GOWN DISPOSABLE) IMPLANT
HEMOSTAT SURGICEL 2X14 (HEMOSTASIS) ×2 IMPLANT
KIT BASIN OR (CUSTOM PROCEDURE TRAY) ×2 IMPLANT
KIT ROOM TURNOVER OR (KITS) ×2 IMPLANT
NDL HYPO 25X1 1.5 SAFETY (NEEDLE) ×1 IMPLANT
NEEDLE HYPO 25X1 1.5 SAFETY (NEEDLE) ×2 IMPLANT
NS IRRIG 1000ML POUR BTL (IV SOLUTION) ×2 IMPLANT
PACK CRANIOTOMY (CUSTOM PROCEDURE TRAY) ×2 IMPLANT
PAD ARMBOARD 7.5X6 YLW CONV (MISCELLANEOUS) ×2 IMPLANT
PATTIES SURGICAL .5 X.5 (GAUZE/BANDAGES/DRESSINGS) IMPLANT
PATTIES SURGICAL .5 X3 (DISPOSABLE) IMPLANT
PATTIES SURGICAL 1X1 (DISPOSABLE) IMPLANT
PIN MAYFIELD SKULL DISP (PIN) IMPLANT
PLATE 1.5  2HOLE LNG NEURO (Plate) ×2 IMPLANT
PLATE 1.5 2HOLE LNG NEURO (Plate) IMPLANT
PLATE 1.5/0.5 18.5MM BURR HOLE (Plate) ×2 IMPLANT
SCREW SELF DRILL HT 1.5/4MM (Screw) ×12 IMPLANT
SPECIMEN JAR SMALL (MISCELLANEOUS) IMPLANT
SPONGE GAUZE 4X4 12PLY (GAUZE/BANDAGES/DRESSINGS) IMPLANT
SPONGE NEURO XRAY DETECT 1X3 (DISPOSABLE) IMPLANT
SPONGE SURGIFOAM ABS GEL 12-7 (HEMOSTASIS) IMPLANT
STAPLER SKIN PROX WIDE 3.9 (STAPLE) ×2 IMPLANT
SUT ETHILON 3 0 PS 1 (SUTURE) IMPLANT
SUT NURALON 4 0 TR CR/8 (SUTURE) ×5 IMPLANT
SUT VIC AB 2-0 CP2 18 (SUTURE) ×4 IMPLANT
SUT VIC AB 3-0 SH 8-18 (SUTURE) IMPLANT
SYR 20ML ECCENTRIC (SYRINGE) ×2 IMPLANT
SYR CONTROL 10ML LL (SYRINGE) ×2 IMPLANT
TOWEL OR 17X24 6PK STRL BLUE (TOWEL DISPOSABLE) ×2 IMPLANT
TOWEL OR 17X26 10 PK STRL BLUE (TOWEL DISPOSABLE) ×2 IMPLANT
TRAP SPECIMEN MUCOUS 40CC (MISCELLANEOUS) IMPLANT
TRAY FOLEY CATH 14FRSI W/METER (CATHETERS) IMPLANT
UNDERPAD 30X30 INCONTINENT (UNDERPADS AND DIAPERS) IMPLANT
WATER STERILE IRR 1000ML POUR (IV SOLUTION) ×2 IMPLANT

## 2013-01-15 NOTE — Progress Notes (Signed)
UR completed.  Sharman Garrott, RN BSN [facility] CCM Trauma/Neuro ICU Case Manager [phone]  

## 2013-01-15 NOTE — Op Note (Signed)
01/16/2013  2:57 PM  PATIENT:  Sara Carlson  79 y.o. female  PRE-OPERATIVE DIAGNOSIS:  Left Subdural hematoma with coagulopathy  POST-OPERATIVE DIAGNOSIS:   Left Subdural hematoma with coagulopathy   PROCEDURE:  Procedure(s): CRANIOTOMY HEMATOMA EVACUATION SUBDURAL (Left)  SURGEON:  Surgeon(s) and Role:    * Carmin Alvidrez, MD - Primary    * Neelesh Nundkumar, MD - Assisting  PHYSICIAN ASSISTANT:   ASSISTANTS: Poteat, RN   ANESTHESIA:   general  EBL:  Total I/O In: 2376 [I.V.:1200; Blood:1176] Out: 650 [Urine:150; Blood:500]  BLOOD ADMINISTERED:none  DRAINS: (10) Jackson-Pratt drain(s) with closed bulb suction in the subgaleal   LOCAL MEDICATIONS USED:  LIDOCAINE   SPECIMEN:  No Specimen  DISPOSITION OF SPECIMEN:  N/A  COUNTS:  YES  TOURNIQUET:  * No tourniquets in log *  DICTATION: Patient is 79 year old woman who has developed large left subdural hematoma.  She has been on coumadin and has fallen.  It was elected to take patient to surgery for left craniotomy for SDH.  Procedure:  Following smooth intubation, patient was placed in right lateral position with left side of head up.  Head was placed on donut head holder left frontal scalp was shaved and prepped and draped in usual sterile fashion with betadine scrub and Duraprep.  Area of planned incision was infiltrated with lidocaine. A curvi-incision was made and carried through temporalis fascia and muscle to expose calvarium on the left side of her head.  Skull flaps was elevated exposing subdural hematoma.  Dura was opened and subdural was evacuated.  The subdural was large and under greater pressure on the left.  The  subdural cavity was irrigated with saline until significantly clearer.  Subdural membranes were opened and a large amount of clotted blood was removed. Hemostasis was assured.  The brain was considerably more relaxed after hematoma evacuation.  #10 JP drain was placed in the subgaleal space and anchored  with vicryl sutures. Bone flap was replaced with plates, the fascia and galea were closed with 2-0 vicryl sutures and the skin was re approximated with staples.  Sterile occlusive dressings were placed.  Patient was returned to a supine position and transferred to the ICU in stable and satisfactory condition. Counts were correct at the end of the case.  PLAN OF CARE: Admit to inpatient   PATIENT DISPOSITION:  PACU - hemodynamically stable.   Delay start of Pharmacological VTE agent (>24hrs) due to surgical blood loss or risk of bleeding: yes  

## 2013-01-15 NOTE — ED Notes (Signed)
Pt in from [facility] via EMS, stated that yesterday patient fell and hit her head, was sent here for evaluation and received negative CT scan at that time, pt takes coumadin, this morning patient was found to have altered mental status by staff, last seen normal was last night around 11pm when patient went to sleep, patient is normally alert and talkative, history of dementia but answers some things appropriately, this morning patient does not answer questions, responds to painful stimuli with eyes opening, some purposeful movement noted, respirations even and nonlabored

## 2013-01-15 NOTE — H&P (Signed)
CSN: [id number] Arrival date & time 02/06/2013 8295  History   First MD Initiated Contact with Patient 01/14/2013 [phone]  Chief Complaint   Patient presents with   .  Altered Mental Status    (Consider location/radiation/quality/duration/timing/severity/associated sxs/prior  Treatment)  HPI Comments: Pt is a 79 y.o. female with Pmhx as above who presents with AMS. Pt seen yesterday after unwittnessed fall at SNF yesterday, on coumadin. This morning was difficult to arouse, will not follow commands, is non-verbal.  Patient is a 79 y.o. female presenting with altered mental status. The history is provided by a relative and the EMS personnel. No language interpreter was used.  Altered Mental Status Presenting symptoms: confusion and partial responsiveness  Severity: Moderate  Most recent episode: Today  Episode history: Single  Timing: Constant  Progression: Worsening  Chronicity: New Context: head injury and nursing home resident  Recent head injury: Within the last 24 hours Associated symptoms: fever and weakness  Associated symptoms: normal movement and no difficulty breathing   Past Medical History   Diagnosis  Date   .  CHF (congestive heart failure)    .  Atrial fibrillation      paroxismal   .  Hyperlipidemia    .  Hypertension    .  Allergic rhinitis    .  Gout     Past Surgical History   Procedure  Laterality  Date   .  Abdominal hysterectomy       no oophorectomy    Family History   Problem  Relation  Age of Onset   .  Colon cancer  Neg Hx    .  Breast cancer  Neg Hx    .  Diabetes  Neg Hx    .  Coronary artery disease  Neg Hx    .  Ataxia  Neg Hx    .  Chorea  Neg Hx    .  Mental retardation  Neg Hx    .  Migraines  Neg Hx    .  Multiple sclerosis  Neg Hx    .  Neurofibromatosis  Neg Hx    .  Neuropathy  Neg Hx    .  Parkinsonism  Neg Hx    .  Seizures  Neg Hx     History   Substance Use Topics   .  Smoking status:  Never Smoker   .  Smokeless tobacco:  Never  Used   .  Alcohol Use:  No      Comment: occasionally    OB History    Grav  Para  Term  Preterm  Abortions  TAB  SAB  Ect  Mult  Living                  Review of Systems  Unable to perform ROS: Mental status change  Constitutional: Positive for fever, activity change, appetite change and fatigue.  HENT: Positive for facial swelling. Negative for sore throat.  Neurological: Positive for weakness.  Psychiatric/Behavioral: Positive for confusion and decreased concentration.   Allergies   Review of patient's allergies indicates no known allergies.  Home Medications   No current outpatient prescriptions on file.  BP 170/79  Pulse 94  Temp(Src) 97.5 F (36.4 C) (Oral)  Resp 14  Ht 5\' 3"  (1.6 m)  Wt 130 lb 15.3 oz (59.4 kg)  BMI 23.20 kg/m2  SpO2 100%  Physical Exam  Constitutional: She is oriented to person, place, and time. She appears  well-developed and well-nourished. No distress.  HENT:  Mouth/Throat: No oropharyngeal exudate.  L facial contusion  Eyes:  Pinpoint BL  Neck: Normal range of motion. Neck supple.  Cardiovascular: Regular rhythm and normal heart sounds. Tachycardia present. Exam reveals no gallop and no friction rub.  No murmur heard.  Pulmonary/Chest: Effort normal and breath sounds normal. No respiratory distress. She has no wheezes. She has no rales.  Abdominal: Soft. Bowel sounds are normal. She exhibits no distension and no mass. There is no tenderness. There is no rebound and no guarding.  Musculoskeletal: Normal range of motion. She exhibits no edema and no tenderness.  Neurological: She is alert and oriented to person, place, and time. She exhibits abnormal muscle tone. GCS eye subscore is 2. GCS verbal subscore is 1. GCS motor subscore is 5.  Cannot follow commands to test strength or sensation  Skin: Skin is warm and dry.  Psychiatric: She has a normal mood and affect.   ED Course   Procedures (including critical care time)  Labs Review  Labs  Reviewed   CBC WITH DIFFERENTIAL - Abnormal; Notable for the following:    RBC  3.50 (*)     Hemoglobin  10.3 (*)     HCT  31.2 (*)     Lymphocytes Relative  6 (*)     Lymphs Abs  0.4 (*)     Monocytes Relative  17 (*)     Monocytes Absolute  1.3 (*)     All other components within normal limits   COMPREHENSIVE METABOLIC PANEL - Abnormal; Notable for the following:    Glucose, Bld  169 (*)     BUN  54 (*)     Creatinine, Ser  1.47 (*)     Albumin  3.3 (*)     GFR calc non Af Amer  31 (*)     GFR calc Af Amer  36 (*)     All other components within normal limits   PROTIME-INR - Abnormal; Notable for the following:    Prothrombin Time  45.0 (*)     INR  4.97 (*)     All other components within normal limits   PROTIME-INR - Abnormal; Notable for the following:    Prothrombin Time  17.8 (*)     INR  1.51 (*)     All other components within normal limits   MRSA PCR SCREENING   APTT   URINALYSIS, ROUTINE W REFLEX MICROSCOPIC   PROTIME-INR   PROTIME-INR   PREPARE FRESH FROZEN PLASMA   TYPE AND SCREEN   ABO/RH   PREPARE RBC (CROSSMATCH)    Imaging Review  Ct Head Wo Contrast  01/29/2013 CLINICAL DATA: Altered mental status EXAM: CT HEAD WITHOUT CONTRAST TECHNIQUE: Contiguous axial images were obtained from the base of the skull through the vertex without intravenous contrast. COMPARISON: 01/14/2013, 11/11/2012. FINDINGS: There is a large hyperdense left extra-axial fluid collection along the left cerebral convexity measuring 2.6 cm in greatest thickness at the level of the left lateral ventricle. There is subdural hemorrhage layering along the tentorium. There is a small amount of subdural blood along the right parietal convexity. There is 10.3 mm of left-to-right midline shift with compression of the left lateral ventricle. There is sulcal effacement along the left cerebral hemisphere. There is partial effacement of the basal cistern. Visualized portions of the orbits are unremarkable.  There is mucosal thickening involving the bilateral maxillary sinuses, ethmoid sinuses and left frontal sinus. There is  a small air-fluid level in bilateral maxillary sinuses and sphenoid sinus. Intracranial vascular atherosclerotic disease is again noted. The osseous structures are unremarkable. IMPRESSION: Large left subdural hematoma measuring 2.6 cm in greatest thickness at the level of the left lateral ventricle. There is subdural hemorrhage layering along the tentorium and a small amount of subdural blood along the right parietal convexity. There is 10.3 mm of left-to-right midline shift with compression of the left lateral ventricle and Severe sulcal effacement of the left cerebral hemisphere. These results were called by telephone at the time of interpretation on 01/18/2013 at 9:49 AM to Dr. Toy Cookey, who verbally acknowledged these results. Electronically Signed By: Elige Ko On: 02/05/2013 09:51  Ct Head Wo Contrast  01/14/2013 CLINICAL DATA: Fall with left facial trauma. EXAM: CT HEAD WITHOUT CONTRAST CT MAXILLOFACIAL WITHOUT CONTRAST CT CERVICAL SPINE WITHOUT CONTRAST TECHNIQUE: Multidetector CT imaging of the head, cervical spine, and maxillofacial structures were performed using the standard protocol without intravenous contrast. Multiplanar CT image reconstructions of the cervical spine and maxillofacial structures were also generated. COMPARISON: 09/14/2012 FINDINGS: CT HEAD FINDINGS Ventricles, cisterns and other CSF spaces are within normal as there is mild age related atrophic change. There is chronic ischemic microvascular disease present. There is no mass, mass effect, shift of midline structures or acute hemorrhage. No evidence of acute infarction. 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 There is moderate calcified atherosclerotic plaque over the cavernous segment of the internal carotid arteries as well as the vertebral arteries. There is evidence of prior sinus surgery and  continued chronic sinus inflammatory disease with air-fluid levels over the maxillary sinuses as cannot exclude a acute component to patient's sinus disease. CT MAXILLOFACIAL FINDINGS There is focal swelling with small hematoma over the left temporal and lateral facial region. Orbits are normal and symmetric. There is moderate opacification over the paranasal sinuses with subtle air-fluid levels over the maxillary sinuses. These findings were present on the prior exam compatible with chronic sinus inflammatory disease. 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 There is chronic deformity of the medial wall of the right orbit. There is no acute fracture. Minimal degenerative change of the temporomandibular joints. Remainder the exam is unchanged. CT CERVICAL SPINE FINDINGS Vertebral body and heights are within normal. There is mild spondylosis present. There is disc space narrowing at the C5-6 level. Prevertebral soft tissues as well as the atlantoaxial articulation are within normal. There is no acute fracture or subluxation. There is uncovertebral joint spurring and mild facet arthropathy. Neural foraminal narrowing is present at multiple levels. Remainder the exam is unchanged to include moderate heterogeneity with multiple hypodense thyroid nodules likely a goiter. IMPRESSION: No acute intracranial findings. No acute cervical spine injury. Soft tissue swelling/hematoma over the lateral left mid face and temporal region. No acute fracture. 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 Moderate chronic sinus inflammatory disease. Spondylosis of the cervical spine with disc disease at the C5-6 level Stable multinodular thyroid likely goiter. Electronically Signed By: Elberta Fortis M.D. On: 01/14/2013 11:03  Ct Cervical Spine Wo Contrast  01/14/2013 CLINICAL DATA: Fall  with left facial trauma. EXAM: CT HEAD WITHOUT CONTRAST CT MAXILLOFACIAL WITHOUT CONTRAST CT CERVICAL SPINE WITHOUT CONTRAST TECHNIQUE: Multidetector CT imaging of the head, cervical spine, and maxillofacial structures were performed using the standard protocol without intravenous contrast. Multiplanar CT image reconstructions of the cervical spine and  maxillofacial structures were also generated. COMPARISON: 09/14/2012 FINDINGS: CT HEAD FINDINGS Ventricles, cisterns and other CSF spaces are within normal as there is mild age related atrophic change. There is chronic ischemic microvascular disease present. There is no mass, mass effect, shift of midline structures or acute hemorrhage. No evidence of acute infarction. 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 There is moderate calcified atherosclerotic plaque over the cavernous segment of the internal carotid arteries as well as the vertebral arteries. There is evidence of prior sinus surgery and continued chronic sinus inflammatory disease with air-fluid levels over the maxillary sinuses as cannot exclude a acute component to patient's sinus disease. CT MAXILLOFACIAL FINDINGS There is focal swelling with small hematoma over the left temporal and lateral facial region. Orbits are normal and symmetric. There is moderate opacification over the paranasal sinuses with subtle air-fluid levels over the maxillary sinuses. These findings were present on the prior exam compatible with chronic sinus inflammatory disease. 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 There is chronic deformity of the medial wall of the right orbit. There is no acute fracture. Minimal degenerative change of the temporomandibular joints. Remainder the exam is unchanged. CT CERVICAL SPINE FINDINGS Vertebral body and heights are within normal. There is mild spondylosis present. There is disc space narrowing at the C5-6 level.  Prevertebral soft tissues as well as the atlantoaxial articulation are within normal. There is no acute fracture or subluxation. There is uncovertebral joint spurring and mild facet arthropathy. Neural foraminal narrowing is present at multiple levels. Remainder the exam is unchanged to include moderate heterogeneity with multiple hypodense thyroid nodules likely a goiter. IMPRESSION: No acute intracranial findings. No acute cervical spine injury. Soft tissue swelling/hematoma over the lateral left mid face and temporal region. No acute fracture. 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 Moderate chronic sinus inflammatory disease. Spondylosis of the cervical spine with disc disease at the C5-6 level Stable multinodular thyroid likely goiter. Electronically Signed By: Elberta Fortis M.D. On: 01/14/2013 11:03  Dg Chest Portable 1 View  01/21/2013 CLINICAL DATA: Altered mental status. EXAM: PORTABLE CHEST - 1 VIEW COMPARISON: November 11, 2012. FINDINGS: Stable cardiomegaly. No pneumothorax or pleural effusion is noted. No acute pulmonary disease is noted. The bony thorax appears intact. IMPRESSION: No acute cardiopulmonary abnormality seen. Electronically Signed By: Roque Lias M.D. On: 01/23/2013 10:01  Ct Maxillofacial Wo Cm  01/14/2013 CLINICAL DATA: Fall with left facial trauma. EXAM: CT HEAD WITHOUT CONTRAST CT MAXILLOFACIAL WITHOUT CONTRAST CT CERVICAL SPINE WITHOUT CONTRAST TECHNIQUE: Multidetector CT imaging of the head, cervical spine, and maxillofacial structures were performed using the standard protocol without intravenous contrast. Multiplanar CT image reconstructions of the cervical spine and maxillofacial structures were also generated. COMPARISON: 09/14/2012 FINDINGS: CT HEAD FINDINGS Ventricles, cisterns and other CSF spaces are within normal as there is mild age related atrophic change. There is chronic ischemic  microvascular disease present. There is no mass, mass effect, shift of midline structures or acute hemorrhage. No evidence of acute infarction. 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 There is moderate calcified atherosclerotic plaque over the cavernous segment of the internal carotid arteries as well as the vertebral arteries. There is evidence of prior sinus surgery and continued chronic sinus inflammatory disease with air-fluid levels over the maxillary sinuses as cannot exclude a acute component to patient's sinus disease. CT MAXILLOFACIAL FINDINGS There is focal swelling  with small hematoma over the left temporal and lateral facial region. Orbits are normal and symmetric. There is moderate opacification over the paranasal sinuses with subtle air-fluid levels over the maxillary sinuses. These findings were present on the prior exam compatible with chronic sinus inflammatory disease. 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 There is chronic deformity of the medial wall of the right orbit. There is no acute fracture. Minimal degenerative change of the temporomandibular joints. Remainder the exam is unchanged. CT CERVICAL SPINE FINDINGS Vertebral body and heights are within normal. There is mild spondylosis present. There is disc space narrowing at the C5-6 level. Prevertebral soft tissues as well as the atlantoaxial articulation are within normal. There is no acute fracture or subluxation. There is uncovertebral joint spurring and mild facet arthropathy. Neural foraminal narrowing is present at multiple levels. Remainder the exam is unchanged to include moderate heterogeneity with multiple hypodense thyroid nodules likely a goiter. IMPRESSION: No acute intracranial findings. No acute cervical spine injury. Soft tissue swelling/hematoma over the lateral left mid face and temporal region. No acute fracture. 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 Moderate chronic sinus inflammatory disease. Spondylosis of the cervical spine with disc disease at the C5-6 level Stable multinodular thyroid likely goiter. Electronically Signed By: Elberta Fortis M.D. On: 01/14/2013 11:03   EKG Interpretation    Date/Time:  Ventricular Rate:  PR Interval:  QRS Duration:  QT Interval:  QTC Calculation:  R Axis:  Text Interpretation:      MDM    1.  Subdural hematoma   2.  Elevated INR    Pt is a 79 y.o. female with Pmhx as above who presents with AMS. Pt seen yesterday after unwittnessed fall at SNF yesterday, on coumadin. CT yesterday negative. INR supratheraputic yesterday with plan to d/c coumadin. This morning was difficult to arouse, will not follow commands. GCS 8, was 14 yesterday per notes. Concern for intracranial bleed, infection, metabolic disturbance. INR ordered.  CT head shows large SDH with midline shift. INR still elevated.  Patient to OR for emergent craniotomy for SDH.  Emergency release FFP, vit K ordered. INR still pending.  11:18AM Family would like to proceed to the OR. K-centra reversal ordered as INR >4.  FFP started in ED. Have spoken to anesthesia who will start K-centra in OR.   Dorian Heckle MD 02/06/2013

## 2013-01-15 NOTE — Anesthesia Postprocedure Evaluation (Signed)
  Anesthesia Post-op Note  Patient: Sara Carlson  Procedure(s) Performed: Procedure(s): CRANIOTOMY HEMATOMA EVACUATION SUBDURAL (Left)  Patient Location: NICU  Anesthesia Type:General  Level of Consciousness: unresponsive and Patient remains intubated per anesthesia plan  Airway and Oxygen Therapy: Patient remains intubated per anesthesia plan and Patient placed on Ventilator (see vital sign flow sheet for setting)  Post-op Pain: none  Post-op Assessment: Post-op Vital signs reviewed, Patient's Cardiovascular Status Stable, Respiratory Function Stable and Patent Airway  Post-op Vital Signs: stable  Complications: No apparent anesthesia complications

## 2013-01-15 NOTE — Preoperative (Signed)
Beta Blockers   Reason not to administer Beta Blockers:Not Applicable 

## 2013-01-15 NOTE — ED Notes (Signed)
Per neuro OR MD, pt to be taken to OR STAT - Vit K, FFP and Theodoro Parma will be hung in OR

## 2013-01-15 NOTE — ED Provider Notes (Signed)
CSN: [id number]     Arrival date & time 02/13/2013  1478 History   First MD Initiated Contact with Patient February 13, 2013 [phone]     Chief Complaint  Patient presents with  . Altered Mental Status   (Consider location/radiation/quality/duration/timing/severity/associated sxs/prior Treatment) HPI Comments: Pt is a 79 y.o. female with Pmhx as above who presents with AMS. Pt seen yesterday after unwittnessed fall at SNF yesterday, on coumadin.  This morning was difficult to arouse, will not follow commands, is non-verbal.  Level 5 caveat for AMS  Patient is a 79 y.o. female presenting with altered mental status. The history is provided by a relative and the EMS personnel. No language interpreter was used.  Altered Mental Status Presenting symptoms: confusion and partial responsiveness   Severity:  Moderate Most recent episode:  Today Episode history:  Single Timing:  Constant Progression:  Worsening Chronicity:  New Context: head injury and nursing home resident   Recent head injury:  Within the last 24 hours Associated symptoms: fever and weakness   Associated symptoms: normal movement and no difficulty breathing     Past Medical History  Diagnosis Date  . CHF (congestive heart failure)   . Atrial fibrillation     paroxismal  . Hyperlipidemia   . Hypertension   . Allergic rhinitis   . Gout    Past Surgical History  Procedure Laterality Date  . Abdominal hysterectomy      no oophorectomy   Family History  Problem Relation Age of Onset  . Colon cancer Neg Hx   . Breast cancer Neg Hx   . Diabetes Neg Hx   . Coronary artery disease Neg Hx   . Ataxia Neg Hx   . Chorea Neg Hx   . Mental retardation Neg Hx   . Migraines Neg Hx   . Multiple sclerosis Neg Hx   . Neurofibromatosis Neg Hx   . Neuropathy Neg Hx   . Parkinsonism Neg Hx   . Seizures Neg Hx    History  Substance Use Topics  . Smoking status: Never Smoker   . Smokeless tobacco: Never Used  . Alcohol Use: No      Comment: occasionally   OB History   Grav Para Term Preterm Abortions TAB SAB Ect Mult Living                 Review of Systems  Unable to perform ROS: Mental status change  Constitutional: Positive for fever, activity change, appetite change and fatigue.  HENT: Positive for facial swelling. Negative for sore throat.   Neurological: Positive for weakness.  Psychiatric/Behavioral: Positive for confusion and decreased concentration.    Allergies  Review of patient's allergies indicates no known allergies.  Home Medications   No current outpatient prescriptions on file. BP 170/79  Pulse 94  Temp(Src) 97.5 F (36.4 C) (Oral)  Resp 14  Ht 5\' 3"  (1.6 m)  Wt 130 lb 15.3 oz (59.4 kg)  BMI 23.20 kg/m2  SpO2 100% Physical Exam  Constitutional: She is oriented to person, place, and time. She appears well-developed and well-nourished. No distress.  HENT:  Mouth/Throat: No oropharyngeal exudate.  L facial contusion  Eyes:  Pinpoint BL  Neck: Normal range of motion. Neck supple.  Cardiovascular: Regular rhythm and normal heart sounds.  Tachycardia present.  Exam reveals no gallop and no friction rub.   No murmur heard. Pulmonary/Chest: Effort normal and breath sounds normal. No respiratory distress. She has no wheezes. She has no rales.  Abdominal:  Soft. Bowel sounds are normal. She exhibits no distension and no mass. There is no tenderness. There is no rebound and no guarding.  Musculoskeletal: Normal range of motion. She exhibits no edema and no tenderness.  Neurological: She is alert and oriented to person, place, and time. She exhibits abnormal muscle tone. GCS eye subscore is 2. GCS verbal subscore is 1. GCS motor subscore is 5.  Cannot follow commands to test strength or sensation  Skin: Skin is warm and dry.  Psychiatric: She has a normal mood and affect.    ED Course  Procedures (including critical care time) Labs Review Labs Reviewed  CBC WITH DIFFERENTIAL - Abnormal;  Notable for the following:    RBC 3.50 (*)    Hemoglobin 10.3 (*)    HCT 31.2 (*)    Lymphocytes Relative 6 (*)    Lymphs Abs 0.4 (*)    Monocytes Relative 17 (*)    Monocytes Absolute 1.3 (*)    All other components within normal limits  COMPREHENSIVE METABOLIC PANEL - Abnormal; Notable for the following:    Glucose, Bld 169 (*)    BUN 54 (*)    Creatinine, Ser 1.47 (*)    Albumin 3.3 (*)    GFR calc non Af Amer 31 (*)    GFR calc Af Amer 36 (*)    All other components within normal limits  PROTIME-INR - Abnormal; Notable for the following:    Prothrombin Time 45.0 (*)    INR 4.97 (*)    All other components within normal limits  PROTIME-INR - Abnormal; Notable for the following:    Prothrombin Time 17.8 (*)    INR 1.51 (*)    All other components within normal limits  MRSA PCR SCREENING  APTT  URINALYSIS, ROUTINE W REFLEX MICROSCOPIC  PROTIME-INR  PROTIME-INR  PREPARE FRESH FROZEN PLASMA  TYPE AND SCREEN  ABO/RH  PREPARE RBC (CROSSMATCH)   Imaging Review Ct Head Wo Contrast  01/25/2013   CLINICAL DATA:  Altered mental status  EXAM: CT HEAD WITHOUT CONTRAST  TECHNIQUE: Contiguous axial images were obtained from the base of the skull through the vertex without intravenous contrast.  COMPARISON:  01/14/2013, 11/11/2012.  FINDINGS: There is a large hyperdense left extra-axial fluid collection along the left cerebral convexity measuring 2.6 cm in greatest thickness at the level of the left lateral ventricle. There is subdural hemorrhage layering along the tentorium. There is a small amount of subdural blood along the right parietal convexity. There is 10.3 mm of left-to-right midline shift with compression of the left lateral ventricle. There is sulcal effacement along the left cerebral hemisphere. There is partial effacement of the basal cistern.  Visualized portions of the orbits are unremarkable. There is mucosal thickening involving the bilateral maxillary sinuses, ethmoid  sinuses and left frontal sinus. There is a small air-fluid level in bilateral maxillary sinuses and sphenoid sinus. Intracranial vascular atherosclerotic disease is again noted.  The osseous structures are unremarkable.  IMPRESSION: Large left subdural hematoma measuring 2.6 cm in greatest thickness at the level of the left lateral ventricle. There is subdural hemorrhage layering along the tentorium and a small amount of subdural blood along the right parietal convexity. There is 10.3 mm of left-to-right midline shift with compression of the left lateral ventricle and Severe sulcal effacement of the left cerebral hemisphere. These results were called by telephone at the time of interpretation on 01/16/2013 at 9:49 AM to Dr. Toy Cookey, who verbally acknowledged these results.  Electronically Signed   By: Elige Ko   On: 01-28-13 09:51   Ct Head Wo Contrast  01/14/2013   CLINICAL DATA:  Fall with left facial trauma.  EXAM: CT HEAD WITHOUT CONTRAST  CT MAXILLOFACIAL WITHOUT CONTRAST  CT CERVICAL SPINE WITHOUT CONTRAST  TECHNIQUE: Multidetector CT imaging of the head, cervical spine, and maxillofacial structures were performed using the standard protocol without intravenous contrast. Multiplanar CT image reconstructions of the cervical spine and maxillofacial structures were also generated.  COMPARISON:  09/14/2012  FINDINGS: CT HEAD FINDINGS  Ventricles, cisterns and other CSF spaces are within normal as there is mild age related atrophic change. There is chronic ischemic microvascular disease present. There is no mass, mass effect, shift of midline structures or acute hemorrhage. No evidence of acute infarction. 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 There is moderate calcified atherosclerotic plaque over the cavernous segment of the internal carotid arteries as well as the vertebral arteries. There is evidence of prior sinus surgery and continued chronic sinus inflammatory disease with  air-fluid levels over the maxillary sinuses as cannot exclude a acute component to patient's sinus disease.  CT MAXILLOFACIAL FINDINGS  There is focal swelling with small hematoma over the left temporal and lateral facial region. Orbits are normal and symmetric. There is moderate opacification over the paranasal sinuses with subtle air-fluid levels over the maxillary sinuses. These findings were present on the prior exam compatible with chronic sinus inflammatory disease. 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 There is chronic deformity of the medial wall of the right orbit. There is no acute fracture. Minimal degenerative change of the temporomandibular joints. Remainder the exam is unchanged.  CT CERVICAL SPINE FINDINGS  Vertebral body and heights are within normal. There is mild spondylosis present. There is disc space narrowing at the C5-6 level. Prevertebral soft tissues as well as the atlantoaxial articulation are within normal. There is no acute fracture or subluxation. There is uncovertebral joint spurring and mild facet arthropathy. Neural foraminal narrowing is present at multiple levels. Remainder the exam is unchanged to include moderate heterogeneity with multiple hypodense thyroid nodules likely a goiter.  IMPRESSION: No acute intracranial findings.  No acute cervical spine injury.  Soft tissue swelling/hematoma over the lateral left mid face and temporal region. No acute fracture.  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  Moderate chronic sinus inflammatory disease.  Spondylosis of the cervical spine with disc disease at the C5-6 level  Stable multinodular thyroid likely goiter.   Electronically Signed   By: Elberta Fortis M.D.   On: 01/14/2013 11:03   Ct Cervical Spine Wo Contrast  01/14/2013   CLINICAL DATA:  Fall with left facial trauma.   EXAM: CT HEAD WITHOUT CONTRAST  CT MAXILLOFACIAL WITHOUT CONTRAST  CT CERVICAL SPINE WITHOUT CONTRAST  TECHNIQUE: Multidetector CT imaging of the head, cervical spine, and maxillofacial structures were performed using the standard protocol without intravenous contrast. Multiplanar CT image reconstructions of the cervical spine and maxillofacial structures were also generated.  COMPARISON:  09/14/2012  FINDINGS: CT HEAD FINDINGS  Ventricles, cisterns and other CSF spaces are within normal as there is mild age related atrophic change. There is chronic ischemic microvascular disease present. There is no mass, mass effect, shift of midline structures or acute hemorrhage. No evidence of acute infarction. 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 There is moderate  calcified atherosclerotic plaque over the cavernous segment of the internal carotid arteries as well as the vertebral arteries. There is evidence of prior sinus surgery and continued chronic sinus inflammatory disease with air-fluid levels over the maxillary sinuses as cannot exclude a acute component to patient's sinus disease.  CT MAXILLOFACIAL FINDINGS  There is focal swelling with small hematoma over the left temporal and lateral facial region. Orbits are normal and symmetric. There is moderate opacification over the paranasal sinuses with subtle air-fluid levels over the maxillary sinuses. These findings were present on the prior exam compatible with chronic sinus inflammatory disease. 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 There is chronic deformity of the medial wall of the right orbit. There is no acute fracture. Minimal degenerative change of the temporomandibular joints. Remainder the exam is unchanged.  CT CERVICAL SPINE FINDINGS  Vertebral body and heights are within normal. There is mild spondylosis present. There is disc space narrowing at the C5-6 level. Prevertebral  soft tissues as well as the atlantoaxial articulation are within normal. There is no acute fracture or subluxation. There is uncovertebral joint spurring and mild facet arthropathy. Neural foraminal narrowing is present at multiple levels. Remainder the exam is unchanged to include moderate heterogeneity with multiple hypodense thyroid nodules likely a goiter.  IMPRESSION: No acute intracranial findings.  No acute cervical spine injury.  Soft tissue swelling/hematoma over the lateral left mid face and temporal region. No acute fracture.  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  Moderate chronic sinus inflammatory disease.  Spondylosis of the cervical spine with disc disease at the C5-6 level  Stable multinodular thyroid likely goiter.   Electronically Signed   By: Elberta Fortis M.D.   On: 01/14/2013 11:03   Dg Chest Portable 1 View  January 19, 2013   CLINICAL DATA:  Altered mental status.  EXAM: PORTABLE CHEST - 1 VIEW  COMPARISON:  November 11, 2012.  FINDINGS: Stable cardiomegaly. No pneumothorax or pleural effusion is noted. No acute pulmonary disease is noted. The bony thorax appears intact.  IMPRESSION: No acute cardiopulmonary abnormality seen.   Electronically Signed   By: Roque Lias M.D.   On: Jan 19, 2013 10:01   Ct Maxillofacial Wo Cm  01/14/2013   CLINICAL DATA:  Fall with left facial trauma.  EXAM: CT HEAD WITHOUT CONTRAST  CT MAXILLOFACIAL WITHOUT CONTRAST  CT CERVICAL SPINE WITHOUT CONTRAST  TECHNIQUE: Multidetector CT imaging of the head, cervical spine, and maxillofacial structures were performed using the standard protocol without intravenous contrast. Multiplanar CT image reconstructions of the cervical spine and maxillofacial structures were also generated.  COMPARISON:  09/14/2012  FINDINGS: CT HEAD FINDINGS  Ventricles, cisterns and other CSF spaces are within normal as there is mild age related atrophic  change. There is chronic ischemic microvascular disease present. There is no mass, mass effect, shift of midline structures or acute hemorrhage. No evidence of acute infarction. 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 There is moderate calcified atherosclerotic plaque over the cavernous segment of the internal carotid arteries as well as the vertebral arteries. There is evidence of prior sinus surgery and continued chronic sinus inflammatory disease with air-fluid levels over the maxillary sinuses as cannot exclude a acute component to patient's sinus disease.  CT MAXILLOFACIAL FINDINGS  There is focal swelling with small hematoma over the left temporal and lateral facial region. Orbits are normal and symmetric.  There is moderate opacification over the paranasal sinuses with subtle air-fluid levels over the maxillary sinuses. These findings were present on the prior exam compatible with chronic sinus inflammatory disease. 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 There is chronic deformity of the medial wall of the right orbit. There is no acute fracture. Minimal degenerative change of the temporomandibular joints. Remainder the exam is unchanged.  CT CERVICAL SPINE FINDINGS  Vertebral body and heights are within normal. There is mild spondylosis present. There is disc space narrowing at the C5-6 level. Prevertebral soft tissues as well as the atlantoaxial articulation are within normal. There is no acute fracture or subluxation. There is uncovertebral joint spurring and mild facet arthropathy. Neural foraminal narrowing is present at multiple levels. Remainder the exam is unchanged to include moderate heterogeneity with multiple hypodense thyroid nodules likely a goiter.  IMPRESSION: No acute intracranial findings.  No acute cervical spine injury.  Soft tissue swelling/hematoma over the lateral left mid face and temporal region. No acute  fracture.  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  Moderate chronic sinus inflammatory disease.  Spondylosis of the cervical spine with disc disease at the C5-6 level  Stable multinodular thyroid likely goiter.   Electronically Signed   By: Elberta Fortis M.D.   On: 01/14/2013 11:03    EKG Interpretation    Date/Time:    Ventricular Rate:    PR Interval:    QRS Duration:   QT Interval:    QTC Calculation:   R Axis:     Text Interpretation:              MDM   1. Subdural hematoma   2. Elevated INR    Pt is a 79 y.o. female with Pmhx as above who presents with AMS. Pt seen yesterday after unwittnessed fall at SNF yesterday, on coumadin.  CT yesterday negative.  INR supratheraputic yesterday with plan to d/c coumadin. This morning was difficult to arouse, will not follow commands.   GCS 8, was 14 yesterday per notes.  Concern for intracranial bleed, infection, metabolic disturbance.  INR ordered.  CT head shows large SDH with midline shift.  [facility] paged.  Labs/INR pending.  Have spoken to family about CT findings.  They states she would not want CPR or intubation.  They are not sure she would want to have operative intervention, but would talk to [facility].  [facility] paged again. Emergency release [facility], vit K ordered.  INR still pending.   11:18AM Family would like to proceed to the OR.  K-centra reversal ordered as INR >4.   FFP started in ED.  Have spoken to anesthesia who will start K-centra in OR.       Shanna Cisco, MD 02-13-13 [phone]

## 2013-01-15 NOTE — Consult Note (Signed)
PULMONARY  / CRITICAL CARE MEDICINE  Name: Sara Carlson MRN: [id number] DOB: 06/15/1922    ADMISSION DATE:  Jan 21, 2013 CONSULTATION DATE:  2013-01-21  REFERRING MD :  Venetia Maxon PRIMARY SERVICE: Neurosurgery  CHIEF COMPLAINT:  Left subdural hematoma with coagulopathy.  BRIEF PATIENT DESCRIPTION: 79 year old female with history of CHF, A-fib, Hypertension and dementia admitted 12/2 with AMS and coagulopathy after a fall at nursing home on 12/1(CT scan 12/1 in ED negative for intracranial findings).  CT scan showed large left subdural hematoma with 10.3 cm left to right midline shift. CCM consulted for vent management post craniotomy/evacuation of hematoma.  SIGNIFICANT EVENTS / STUDIES:  12/1: CT Head:No acute intracranial findings ......................................................................................... 12/2: Admit post fall with CT Head:Large left subdural hematoma measuring 2.6 cm 12/2: Craniotomy: Evacuation of Left. Subdural Hematoma  LINES / TUBES:  ETT: 12/2>>>  CULTURES:  ANTIBIOTICS: None  HISTORY OF PRESENT ILLNESS:   79 year old female with history of CHF, A-fib,Hypertension and dementia admitted 12/2 with AMS and coagulopathy after a fall at nursing home on 12/1(CT scan 12/1 in ED negative for intracranial findings). Pt is on chronic coumadin for a-fib. CT scan showed large left subdural hematoma with 10.3 cm left to right midline shift. Neurosurgery admitted and patient was taken to the OR for craniotomy and evacuation of hematoma . CCM consulted for vent management post op.  PAST MEDICAL HISTORY :  Past Medical History  Diagnosis Date  . CHF (congestive heart failure)   . Atrial fibrillation     paroxismal  . Hyperlipidemia   . Hypertension   . Allergic rhinitis   . Gout    Past Surgical History  Procedure Laterality Date  . Abdominal hysterectomy      no oophorectomy   Prior to Admission medications   Medication Sig Start Date End Date Taking?  Authorizing Provider  acetaminophen (TYLENOL) 500 MG tablet Take 1,000 mg by mouth every 6 (six) hours as needed for pain.   Yes Historical Provider, MD  carvedilol (COREG) 6.25 MG tablet take 1 tablet by mouth twice a day 03/06/12  Yes Wendall Stade, MD  divalproex (DEPAKOTE SPRINKLE) 125 MG capsule Take 125-250 mg by mouth 3 (three) times daily. Take 125mg  (1 capsule) at 8am and take 250mg  (2 capsule) at 12 noon and 4pm   Yes Historical Provider, MD  doxazosin (CARDURA) 8 MG tablet take 1 tablet by mouth at bedtime 05/05/12  Yes Wanda Plump, MD  febuxostat (ULORIC) 40 MG tablet take 1 tablet by mouth once daily 12/28/12  Yes Wanda Plump, MD  furosemide (LASIX) 40 MG tablet Take 1 tablet (40 mg total) by mouth daily. 12/06/11  Yes Wanda Plump, MD  LORazepam (ATIVAN) 0.5 MG tablet Take 0.5 mg by mouth every 6 (six) hours as needed for anxiety.   Yes Historical Provider, MD  metolazone (ZAROXOLYN) 2.5 MG tablet take 1 tablet by mouth once daily 09/01/12  Yes Lewayne Bunting, MD  potassium chloride SA (K-DUR,KLOR-CON) 20 MEQ tablet Take 1 tablet (20 mEq total) by mouth daily. 10/17/12  Yes Wendall Stade, MD  QUEtiapine (SEROQUEL) 50 MG tablet Take 50 mg by mouth at bedtime.   Yes Historical Provider, MD  sertraline (ZOLOFT) 50 MG tablet Take 50 mg by mouth daily.   Yes Historical Provider, MD  spironolactone (ALDACTONE) 25 MG tablet Take 12.5 mg by mouth every morning.  05/15/12  Yes Wanda Plump, MD  warfarin (COUMADIN) 2.5 MG tablet Take  2.5-5 mg by mouth daily. Take 1 tablet (2.5mg ) on Tuesday.  Take 2 tablets (5mg ) on monday, Wednesday, thursday, Friday, Saturday, sunday   Yes Historical Provider, MD   No Known Allergies  FAMILY HISTORY:  Family History  Problem Relation Age of Onset  . Colon cancer Neg Hx   . Breast cancer Neg Hx   . Diabetes Neg Hx   . Coronary artery disease Neg Hx   . Ataxia Neg Hx   . Chorea Neg Hx   . Mental retardation Neg Hx   . Migraines Neg Hx   . Multiple sclerosis  Neg Hx   . Neurofibromatosis Neg Hx   . Neuropathy Neg Hx   . Parkinsonism Neg Hx   . Seizures Neg Hx    SOCIAL HISTORY:  reports that she has never smoked. She has never used smokeless tobacco. She reports that she does not drink alcohol or use illicit drugs.  REVIEW OF SYSTEMS:   Unable as patient is unresponsive and sedated on vent.  SUBJECTIVE:   VITAL SIGNS: Temp:  [97.5 F (36.4 C)] 97.5 F (36.4 C) (12/02 1515) Pulse Rate:  [37-110] 38 (12/02 1715) Resp:  [14-27] 14 (12/02 1715) BP: (121-172)/(52-113) 121/58 mmHg (12/02 1700) SpO2:  [95 %-100 %] 100 % (12/02 1715) FiO2 (%):  [40 %-60 %] 60 % (12/02 1700) Weight:  [127 lb 13.9 oz (58 kg)-130 lb 15.3 oz (59.4 kg)] 130 lb 15.3 oz (59.4 kg) (12/02 1515)  HEMODYNAMICS:    VENTILATOR SETTINGS: Vent Mode:  [-] PRVC FiO2 (%):  [40 %-60 %] 60 % Set Rate:  [14 bmp] 14 bmp Vt Set:  [400 mL-420 mL] 400 mL PEEP:  [5 cmH20] 5 cmH20 Plateau Pressure:  [18 cmH20] 18 cmH20  INTAKE / OUTPUT: Intake/Output     12 /01 0701 - 12/02 0700 12/02 0701 - 12/03 0700   I.V. (mL/kg)  1254 (21.1)   Blood  1176   Other  20   NG/GT  60   IV Piggyback  50   Total Intake(mL/kg)  2560 (43.1)   Urine (mL/kg/hr)  220   Drains  35   Blood  500   Total Output   755   Net   +1805          PHYSICAL EXAMINATION: General:  Unresponsive to deep pain, sedated on vent. Neuro: Sedated,unresponsive, pinpoint pupils, no extremity movement.   HEENT: Large dressing to left side of head,ETT, OGT. Cardiovascular: Irregular rate and rhythm,no MGR noted. Lungs: Rhonchi throughout, diminished per bases with clear to tan moderate secretions noted. Abdomen: Soft, flat, non-tender, diminished BS. Musculoskeletal: Abnormal muscle tone.   Skin:Intact with exception of left frontal craniotomy incision  LABS:  CBC  Recent Labs Lab 01/14/13 1050 02/04/2013 0956  WBC 3.9* 7.7  HGB 12.3 10.3*  HCT 37.2 31.2*  PLT 196 190   Coag's  Recent Labs Lab  01/14/13 1050 02/01/2013 0956 02/03/2013 1303  APTT  --   --  34  INR 4.35* 4.97* 1.51*   BMET  Recent Labs Lab 01/14/13 1050 01/14/2013 0956  NA 142 139  K 4.4 3.5  CL 102 100  CO2 28 22  BUN 62* 54*  CREATININE 1.61* 1.47*  GLUCOSE 81 169*   Electrolytes  Recent Labs Lab 01/14/13 1050 22-Jan-2013 0956  CALCIUM 9.4 9.2   Sepsis Markers No results found for this basename: LATICACIDVEN, PROCALCITON, O2SATVEN,  in the last 168 hours ABG  Recent Labs Lab 02/02/2013 1649  PHART 7.465*  PCO2ART 32.0*  PO2ART 63.0*   Liver Enzymes  Recent Labs Lab 01/16/13 0956  AST 17  ALT 6  ALKPHOS 69  BILITOT 0.5  ALBUMIN 3.3*   Cardiac Enzymes No results found for this basename: TROPONINI, PROBNP,  in the last 168 hours Glucose No results found for this basename: GLUCAP,  in the last 168 hours  Imaging Ct Head Wo Contrast  01/16/2013   CLINICAL DATA:  Altered mental status  EXAM: CT HEAD WITHOUT CONTRAST  TECHNIQUE: Contiguous axial images were obtained from the base of the skull through the vertex without intravenous contrast.  COMPARISON:  01/14/2013, 11/11/2012.  FINDINGS: There is a large hyperdense left extra-axial fluid collection along the left cerebral convexity measuring 2.6 cm in greatest thickness at the level of the left lateral ventricle. There is subdural hemorrhage layering along the tentorium. There is a small amount of subdural blood along the right parietal convexity. There is 10.3 mm of left-to-right midline shift with compression of the left lateral ventricle. There is sulcal effacement along the left cerebral hemisphere. There is partial effacement of the basal cistern.  Visualized portions of the orbits are unremarkable. There is mucosal thickening involving the bilateral maxillary sinuses, ethmoid sinuses and left frontal sinus. There is a small air-fluid level in bilateral maxillary sinuses and sphenoid sinus. Intracranial vascular atherosclerotic disease is again  noted.  The osseous structures are unremarkable.  IMPRESSION: Large left subdural hematoma measuring 2.6 cm in greatest thickness at the level of the left lateral ventricle. There is subdural hemorrhage layering along the tentorium and a small amount of subdural blood along the right parietal convexity. There is 10.3 mm of left-to-right midline shift with compression of the left lateral ventricle and Severe sulcal effacement of the left cerebral hemisphere. These results were called by telephone at the time of interpretation on 01-16-2013 at 9:49 AM to Dr. Toy Cookey, who verbally acknowledged these results.   Electronically Signed   By: Elige Ko   On: Jan 16, 2013 09:51   Ct Head Wo Contrast  01/14/2013   CLINICAL DATA:  Fall with left facial trauma.  EXAM: CT HEAD WITHOUT CONTRAST  CT MAXILLOFACIAL WITHOUT CONTRAST  CT CERVICAL SPINE WITHOUT CONTRAST  TECHNIQUE: Multidetector CT imaging of the head, cervical spine, and maxillofacial structures were performed using the standard protocol without intravenous contrast. Multiplanar CT image reconstructions of the cervical spine and maxillofacial structures were also generated.  COMPARISON:  09/14/2012  FINDINGS: CT HEAD FINDINGS  Ventricles, cisterns and other CSF spaces are within normal as there is mild age related atrophic change. There is chronic ischemic microvascular disease present. There is no mass, mass effect, shift of midline structures or acute hemorrhage. No evidence of acute infarction. 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 There is moderate calcified atherosclerotic plaque over the cavernous segment of the internal carotid arteries as well as the vertebral arteries. There is evidence of prior sinus surgery and continued chronic sinus inflammatory disease with air-fluid levels over the maxillary sinuses as cannot exclude a acute component to patient's sinus disease.  CT MAXILLOFACIAL FINDINGS  There is focal swelling with small  hematoma over the left temporal and lateral facial region. Orbits are normal and symmetric. There is moderate opacification over the paranasal sinuses with subtle air-fluid levels over the maxillary sinuses. These findings were present on the prior exam compatible with chronic sinus inflammatory disease. 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 There  is chronic deformity of the medial wall of the right orbit. There is no acute fracture. Minimal degenerative change of the temporomandibular joints. Remainder the exam is unchanged.  CT CERVICAL SPINE FINDINGS  Vertebral body and heights are within normal. There is mild spondylosis present. There is disc space narrowing at the C5-6 level. Prevertebral soft tissues as well as the atlantoaxial articulation are within normal. There is no acute fracture or subluxation. There is uncovertebral joint spurring and mild facet arthropathy. Neural foraminal narrowing is present at multiple levels. Remainder the exam is unchanged to include moderate heterogeneity with multiple hypodense thyroid nodules likely a goiter.  IMPRESSION: No acute intracranial findings.  No acute cervical spine injury.  Soft tissue swelling/hematoma over the lateral left mid face and temporal region. No acute fracture.  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  Moderate chronic sinus inflammatory disease.  Spondylosis of the cervical spine with disc disease at the C5-6 level  Stable multinodular thyroid likely goiter.   Electronically Signed   By: Elberta Fortis M.D.   On: 01/14/2013 11:03   Ct Cervical Spine Wo Contrast  01/14/2013   CLINICAL DATA:  Fall with left facial trauma.  EXAM: CT HEAD WITHOUT CONTRAST  CT MAXILLOFACIAL WITHOUT CONTRAST  CT CERVICAL SPINE WITHOUT CONTRAST  TECHNIQUE: Multidetector CT imaging of the head, cervical spine, and  maxillofacial structures were performed using the standard protocol without intravenous contrast. Multiplanar CT image reconstructions of the cervical spine and maxillofacial structures were also generated.  COMPARISON:  09/14/2012  FINDINGS: CT HEAD FINDINGS  Ventricles, cisterns and other CSF spaces are within normal as there is mild age related atrophic change. There is chronic ischemic microvascular disease present. There is no mass, mass effect, shift of midline structures or acute hemorrhage. No evidence of acute infarction. 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 There is moderate calcified atherosclerotic plaque over the cavernous segment of the internal carotid arteries as well as the vertebral arteries. There is evidence of prior sinus surgery and continued chronic sinus inflammatory disease with air-fluid levels over the maxillary sinuses as cannot exclude a acute component to patient's sinus disease.  CT MAXILLOFACIAL FINDINGS  There is focal swelling with small hematoma over the left temporal and lateral facial region. Orbits are normal and symmetric. There is moderate opacification over the paranasal sinuses with subtle air-fluid levels over the maxillary sinuses. These findings were present on the prior exam compatible with chronic sinus inflammatory disease. 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 There is chronic deformity of the medial wall of the right orbit. There is no acute fracture. Minimal degenerative change of the temporomandibular joints. Remainder the exam is unchanged.  CT CERVICAL SPINE FINDINGS  Vertebral body and heights are within normal. There is mild spondylosis present. There is disc space narrowing at the C5-6 level. Prevertebral soft tissues as well as the atlantoaxial articulation are within normal. There is no acute fracture or subluxation. There is uncovertebral joint spurring and mild facet  arthropathy. Neural foraminal narrowing is present at multiple levels. Remainder the exam is unchanged to include moderate heterogeneity with multiple hypodense thyroid nodules likely a goiter.  IMPRESSION: No acute intracranial findings.  No acute cervical spine injury.  Soft tissue swelling/hematoma over the lateral left mid face and temporal region. No acute fracture.  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  Moderate chronic sinus inflammatory disease.  Spondylosis of the cervical spine with disc disease at the C5-6 level  Stable multinodular thyroid likely goiter.   Electronically Signed   By: Elberta Fortis M.D.   On: 01/14/2013 11:03   Dg Chest Port 1 View  01/18/2013   CLINICAL DATA:  Endotracheal tube placement  EXAM: PORTABLE CHEST - 1 VIEW  COMPARISON:  January 15, 2013 9:50 a.m.  FINDINGS: There is interval placement of endotracheal tube with the distal tip 3.8 cm from carinal. There is no pneumothorax. Nasogastric tube is identified coiled in the fundus of stomach. There is mild patchy opacity of the medial right lung base. There is no pleural effusion. The aorta is tortuous. The heart size is enlarged.  IMPRESSION: Endotracheal tube in good position. There is no pneumothorax. Nasogastric tube is identified coiled in the fundus of stomach. Mild patchy opacity medial right lung base suspicious for pneumonia.   Electronically Signed   By: Sherian Rein M.D.   On: 02/09/2013 16:34   Dg Chest Portable 1 View  02/10/2013   CLINICAL DATA:  Altered mental status.  EXAM: PORTABLE CHEST - 1 VIEW  COMPARISON:  November 11, 2012.  FINDINGS: Stable cardiomegaly. No pneumothorax or pleural effusion is noted. No acute pulmonary disease is noted. The bony thorax appears intact.  IMPRESSION: No acute cardiopulmonary abnormality seen.   Electronically Signed   By: Roque Lias M.D.   On: 01/14/2013 10:01   Ct Maxillofacial Wo  Cm  01/14/2013   CLINICAL DATA:  Fall with left facial trauma.  EXAM: CT HEAD WITHOUT CONTRAST  CT MAXILLOFACIAL WITHOUT CONTRAST  CT CERVICAL SPINE WITHOUT CONTRAST  TECHNIQUE: Multidetector CT imaging of the head, cervical spine, and maxillofacial structures were performed using the standard protocol without intravenous contrast. Multiplanar CT image reconstructions of the cervical spine and maxillofacial structures were also generated.  COMPARISON:  09/14/2012  FINDINGS: CT HEAD FINDINGS  Ventricles, cisterns and other CSF spaces are within normal as there is mild age related atrophic change. There is chronic ischemic microvascular disease present. There is no mass, mass effect, shift of midline structures or acute hemorrhage. No evidence of acute infarction. 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 There is moderate calcified atherosclerotic plaque over the cavernous segment of the internal carotid arteries as well as the vertebral arteries. There is evidence of prior sinus surgery and continued chronic sinus inflammatory disease with air-fluid levels over the maxillary sinuses as cannot exclude a acute component to patient's sinus disease.  CT MAXILLOFACIAL FINDINGS  There is focal swelling with small hematoma over the left temporal and lateral facial region. Orbits are normal and symmetric. There is moderate opacification over the paranasal sinuses with subtle air-fluid levels over the maxillary sinuses. These findings were present on the prior exam compatible with chronic sinus inflammatory disease. 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 There is chronic deformity of the medial wall of the right orbit. There is no acute fracture. Minimal degenerative change of the temporomandibular joints. Remainder the exam is unchanged.  CT CERVICAL SPINE FINDINGS  Vertebral body and heights are within normal. There is mild spondylosis present.  There is disc space narrowing at the C5-6 level. Prevertebral soft tissues as well as the atlantoaxial articulation are within normal. There is no acute fracture or subluxation. There is uncovertebral joint spurring and mild facet arthropathy. Neural foraminal  narrowing is present at multiple levels. Remainder the exam is unchanged to include moderate heterogeneity with multiple hypodense thyroid nodules likely a goiter.  IMPRESSION: No acute intracranial findings.  No acute cervical spine injury.  Soft tissue swelling/hematoma over the lateral left mid face and temporal region. No acute fracture.  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  Moderate chronic sinus inflammatory disease.  Spondylosis of the cervical spine with disc disease at the C5-6 level  Stable multinodular thyroid likely goiter.   Electronically Signed   By: Elberta Fortis M.D.   On: 01/14/2013 11:03   ASSESSMENT / PLAN:  PULMONARY A:  Acute Respiratory Failure 2/2 Subdural hematoma requiring craniotomy. At risk for aspiration P: -Sedation adequate to maintain ventilator synchrony. -Titrate O2 to maintain SAO2 of >92% - ABG Now and in am - CXR Now and in am - Trend Mag - Trend Phos - Monitor secretions for changes - HOB elevated 30 degrees   CARDIOVASCULAR A: Atrial Fibrillation rate controlled Hypertension  CHF Hyperlipidemia P:  - No further anticoagulation as risk outweighs benefit (per NSGY). - Rate control as needed - Maintain B/P <160 systolic per neurosurgery.  PRN's available - Continue home Cardura and Coreg - Strict I&O - BNP 12/3 - Lasix as ordered  RENAL A:  Acute Renal Failure/GFR=36 P:   -Trend BMET -Replete Electrolytes as needed -Monitor U/O -kcl 20 mEq x1   GASTROINTESTINAL A: Protein-calorie malnutrition  P:   PPI for sup Dietary consult for tube feed recommendations Consider early tube  feeding  HEMATOLOGIC A:   Coagulopathy Post-op Anemia P:  -Trend CBC -Trend PT/INR per pharmacy post Kcentra administration - No further anticoagulation as risk outweighs benefit . - Transfuse as needed to maintain HGB of 7.0 - Monitor for overt blood loss   INFECTIOUS A: At risk for Aspiration P:   -Trend WBC - monitor for change in secretions - suction at bedside   ENDOCRINE A: At risk for hypoglycemia  P:  CBG Q 4   NEUROLOGIC A:  Large Left Subdural Hematoma S/P Craniotomy AMS P:   Post Craniotomy orders per Neurosurgery Neuro checks per neurosurgery Maintain sedation as ordered at present for BP control and pain control.   TODAY'S SUMMARY: Pt. Is stable post op craniotomy. Will need to assess neuro recovery over the next few days and evaluate the goals of care in this 79 year old patient with dementia.  Scribed by Kandice Robinsons, RN  ACNP Student [employer] for Canary Brim NP-C  Canary Brim, NP-C [facility] Pgr: [phone] or [phone]   I have personally obtained a history, examined the patient, evaluated laboratory and imaging results, formulated the assessment and plan and placed orders.  CRITICAL CARE: The patient is critically ill with multiple organ systems failure and requires high complexity decision making for assessment and support, frequent evaluation and titration of therapies, application of advanced monitoring technologies and extensive interpretation of multiple databases. Critical Care Time devoted to patient care services described in this note is 45 minutes.   2013-02-07, 5:28 PM  Alyson Reedy, M.D. [facility] Pulmonary/Critical Care Medicine. Pager: [phone]. After hours pager: [phone].

## 2013-01-15 NOTE — Progress Notes (Signed)
Patient waking up, moving all extremities.  Not following commands.  Appears to have pain in left wrist.  We will obtain X ray as family is concerned she fell on it.

## 2013-01-15 NOTE — Progress Notes (Signed)
ELink  Patient admitted postoperatively to the intensive care unit after emergency craniotomy with evacuation of subdural left-sided hematoma. At this time she is agitated on the ventilator and hypertensive nurse at the bedside. She's also dyssynchronous with the ventilator.  Dr. Molli Knock bedside critical care physician to do critical care consultation Diprivan gtt order frmo elink; goal sbp < 160   Dr. Kalman Shan, M.D., [facility].C.P Pulmonary and Critical Care Medicine Staff Physician  System [place] [facility] Pager: [phone], If no answer or between  15:00h - 7:00h: call [phone]  20-Jan-2013 3:34 PM

## 2013-01-15 NOTE — Transfer of Care (Signed)
Immediate Anesthesia Transfer of Care Note  Patient: Sara Carlson  Procedure(s) Performed: Procedure(s): CRANIOTOMY HEMATOMA EVACUATION SUBDURAL (Left)  Patient Location: PACU and NICU  Anesthesia Type:General  Level of Consciousness: Patient remains intubated per anesthesia plan  Airway & Oxygen Therapy: Patient remains intubated per anesthesia plan   Post-op Assessment: Report given to PACU RN  Post vital signs: Reviewed and stable  Complications: No apparent anesthesia complications

## 2013-01-15 NOTE — ED Notes (Signed)
Pt daughter to bedside, states that last night around 7-8pm last night when she left patient at [facility] place that patient was not acting per her normal, states that patient was not as alert as normal and was acting herself and moving like she normally does. States patient required more assistance than normal with daily activities.

## 2013-01-15 NOTE — Anesthesia Preprocedure Evaluation (Signed)
Anesthesia Evaluation  Patient identified by MRN, date of birth, ID band Patient unresponsive    Reviewed: Allergy & Precautions, H&P , NPO status , Patient's Chart, lab work & pertinent test results  Airway Mallampati: II TM Distance: >3 FB     Dental  (+) Teeth Intact   Pulmonary  + rhonchi   + decreased breath sounds      Cardiovascular hypertension, Rhythm:Irregular Rate:Normal     Neuro/Psych    GI/Hepatic   Endo/Other    Renal/GU      Musculoskeletal   Abdominal   Peds  Hematology   Anesthesia Other Findings   Reproductive/Obstetrics                           Anesthesia Physical Anesthesia Plan  ASA: IV and emergent  Anesthesia Plan: General   Post-op Pain Management:    Induction: Intravenous  Airway Management Planned: Oral ETT  Additional Equipment: Arterial line  Intra-op Plan:   Post-operative Plan:   Informed Consent: I have reviewed the patients History and Physical, chart, labs and discussed the procedure including the risks, benefits and alternatives for the proposed anesthesia with the patient or authorized representative who has indicated his/her understanding and acceptance.     Plan Discussed with: CRNA and Anesthesiologist  Anesthesia Plan Comments: (Acute expanding L. SDH with worsening mental status Chronic AFib on coumadin INR 4.97 Pulmonary Htn by echo, LVEF 40% by echo 2013  Plan GA with art line  Kipp Brood, MD)        Anesthesia Quick Evaluation

## 2013-01-15 NOTE — ED Notes (Signed)
Patient transported to CT 

## 2013-01-15 NOTE — Brief Op Note (Signed)
02/01/13  2:57 PM  PATIENT:  Sara Carlson  79 y.o. female  PRE-OPERATIVE DIAGNOSIS:  Left Subdural hematoma with coagulopathy  POST-OPERATIVE DIAGNOSIS:   Left Subdural hematoma with coagulopathy   PROCEDURE:  Procedure(s): CRANIOTOMY HEMATOMA EVACUATION SUBDURAL (Left)  SURGEON:  Surgeon(s) and Role:    * Maeola Harman, MD - Primary    * Lisbeth Renshaw, MD - Assisting  PHYSICIAN ASSISTANT:   ASSISTANTS: Poteat, RN   ANESTHESIA:   general  EBL:  Total I/O In: 2376 [I.V.:1200; Blood:1176] Out: 650 [Urine:150; Blood:500]  BLOOD ADMINISTERED:none  DRAINS: (10) Jackson-Pratt drain(s) with closed bulb suction in the subgaleal   LOCAL MEDICATIONS USED:  LIDOCAINE   SPECIMEN:  No Specimen  DISPOSITION OF SPECIMEN:  N/A  COUNTS:  YES  TOURNIQUET:  * No tourniquets in log *  DICTATION: Patient is 79 year old woman who has developed large left subdural hematoma.  She has been on coumadin and has fallen.  It was elected to take patient to surgery for left craniotomy for SDH.  Procedure:  Following smooth intubation, patient was placed in right lateral position with left side of head up.  Head was placed on donut head holder left frontal scalp was shaved and prepped and draped in usual sterile fashion with betadine scrub and Duraprep.  Area of planned incision was infiltrated with lidocaine. A curvi-incision was made and carried through temporalis fascia and muscle to expose calvarium on the left side of her head.  Skull flaps was elevated exposing subdural hematoma.  Dura was opened and subdural was evacuated.  The subdural was large and under greater pressure on the left.  The  subdural cavity was irrigated with saline until significantly clearer.  Subdural membranes were opened and a large amount of clotted blood was removed. Hemostasis was assured.  The brain was considerably more relaxed after hematoma evacuation.  #10 JP drain was placed in the subgaleal space and anchored  with vicryl sutures. Bone flap was replaced with plates, the fascia and galea were closed with 2-0 vicryl sutures and the skin was re approximated with staples.  Sterile occlusive dressings were placed.  Patient was returned to a supine position and transferred to the ICU in stable and satisfactory condition. Counts were correct at the end of the case.  PLAN OF CARE: Admit to inpatient   PATIENT DISPOSITION:  PACU - hemodynamically stable.   Delay start of Pharmacological VTE agent (>24hrs) due to surgical blood loss or risk of bleeding: yes

## 2013-01-16 ENCOUNTER — Inpatient Hospital Stay (HOSPITAL_COMMUNITY): Payer: PRIVATE HEALTH INSURANCE

## 2013-01-16 ENCOUNTER — Encounter (HOSPITAL_COMMUNITY): Payer: Self-pay | Admitting: Radiology

## 2013-01-16 DIAGNOSIS — I10 Essential (primary) hypertension: Secondary | ICD-10-CM

## 2013-01-16 LAB — PROTIME-INR
INR: 1.2 (ref 0.00–1.49)
Prothrombin Time: 14.9 seconds (ref 11.6–15.2)
Prothrombin Time: 16.2 seconds — ABNORMAL HIGH (ref 11.6–15.2)

## 2013-01-16 LAB — CBC
Hemoglobin: 9.3 g/dL — ABNORMAL LOW (ref 12.0–15.0)
MCH: 29.6 pg (ref 26.0–34.0)
MCHC: 34.1 g/dL (ref 30.0–36.0)
MCV: 86.9 fL (ref 78.0–100.0)
Platelets: 140 10*3/uL — ABNORMAL LOW (ref 150–400)
RBC: 3.14 MIL/uL — ABNORMAL LOW (ref 3.87–5.11)

## 2013-01-16 LAB — PREPARE FRESH FROZEN PLASMA
Unit division: 0
Unit division: 0

## 2013-01-16 LAB — BASIC METABOLIC PANEL
CO2: 22 mEq/L (ref 19–32)
Calcium: 7.3 mg/dL — ABNORMAL LOW (ref 8.4–10.5)
Creatinine, Ser: 1.21 mg/dL — ABNORMAL HIGH (ref 0.50–1.10)
GFR calc non Af Amer: 39 mL/min — ABNORMAL LOW (ref >90)
Glucose, Bld: 104 mg/dL — ABNORMAL HIGH (ref 70–99)

## 2013-01-16 LAB — BLOOD GAS, ARTERIAL
Drawn by: 36274
MECHVT: 400 mL
O2 Saturation: 99.3 %
PEEP: 5 cmH2O
Patient temperature: 98.6
RATE: 14 resp/min
TCO2: 20 mmol/L (ref 0–100)
pH, Arterial: 7.393 (ref 7.350–7.450)

## 2013-01-16 LAB — POCT I-STAT 7, (LYTES, BLD GAS, ICA,H+H)
Bicarbonate: 26.9 mEq/L — ABNORMAL HIGH (ref 20.0–24.0)
O2 Saturation: 100 %
Patient temperature: 35
Potassium: 3.2 mEq/L — ABNORMAL LOW (ref 3.5–5.1)
Sodium: 140 mEq/L (ref 135–145)
TCO2: 28 mmol/L (ref 0–100)
pCO2 arterial: 30.9 mmHg — ABNORMAL LOW (ref 35.0–45.0)

## 2013-01-16 LAB — GLUCOSE, CAPILLARY
Glucose-Capillary: 125 mg/dL — ABNORMAL HIGH (ref 70–99)
Glucose-Capillary: 135 mg/dL — ABNORMAL HIGH (ref 70–99)

## 2013-01-16 LAB — POCT I-STAT 4, (NA,K, GLUC, HGB,HCT)
Glucose, Bld: 215 mg/dL — ABNORMAL HIGH (ref 70–99)
Hemoglobin: 9.5 g/dL — ABNORMAL LOW (ref 12.0–15.0)
Potassium: 3.5 mEq/L (ref 3.5–5.1)

## 2013-01-16 LAB — PHOSPHORUS: Phosphorus: 3 mg/dL (ref 2.3–4.6)

## 2013-01-16 MED ORDER — PRO-STAT SUGAR FREE PO LIQD
30.0000 mL | Freq: Two times a day (BID) | ORAL | Status: DC
  Administered 2013-01-16 – 2013-01-20 (×9): 30 mL
  Filled 2013-01-16 (×10): qty 30

## 2013-01-16 MED ORDER — POTASSIUM CHLORIDE 20 MEQ/15ML (10%) PO LIQD
20.0000 meq | Freq: Every day | ORAL | Status: DC
  Administered 2013-01-16: 20 meq
  Filled 2013-01-16: qty 15

## 2013-01-16 MED ORDER — VITAL AF 1.2 CAL PO LIQD
1000.0000 mL | ORAL | Status: DC
  Administered 2013-01-16 – 2013-01-19 (×2): 1000 mL
  Filled 2013-01-16 (×4): qty 1000

## 2013-01-16 NOTE — Progress Notes (Signed)
Pt. Had localized shaking or tremors of the upper shoulder and arm that lasted 3 mins  Pt. sedated withdraws to pain, Dr. Danielle Dess made aware. will cont. to monitor.

## 2013-01-16 NOTE — Progress Notes (Signed)
INITIAL NUTRITION ASSESSMENT  DOCUMENTATION CODES Per approved criteria  -Not Applicable   INTERVENTION: Initiate Vital AF 1.2 @ Vital AF 1.2 ml/hr @ 30 ml/hr.   30 ml Prostat BID.    Tube feeding regimen provides 1064 kcal, 84 grams of protein, and 584 ml of H2O.    NUTRITION DIAGNOSIS: Inadequate oral intake related to inability to eat as evidenced by NPO status.  Goal: Pt to meet >/= 90% of their estimated nutrition needs   Monitor:  TF initiation and tolerance, weight trend, labs   Reason for Assessment: Consult received to initiate and manage enteral nutrition support.  79 y.o. female  Admitting Dx: <principal problem not specified>  ASSESSMENT: Pt admitted after a fall at her [facility]. Pt with a large SDH s/p craniotomy. Pt failed weaning this morning.   Patient is currently intubated on ventilator support.  MV: 6.2 L/min Temp:Temp (24hrs), Avg:97.9 F (36.6 C), Min:97.3 F (36.3 C), Max:98.5 F (36.9 C)  Weight hx appears stable. Nutrition intake hx not available.   Nutrition Focused Physical Exam:  Subcutaneous Fat:  Orbital Region: WNL Upper Arm Region: WNL Thoracic and Lumbar Region: WNL  Muscle:  Temple Region: severe wasting Clavicle Bone Region: severe wasting  Clavicle and Acromion Bone Region: WNL Scapular Bone Region: NA Dorsal Hand: NA Patellar Region: WNL Anterior Thigh Region: WNL Posterior Calf Region: WNL  Edema: not present   Height: Ht Readings from Last 1 Encounters:  01/16/13 5\' 3"  (1.6 m)    Weight: Wt Readings from Last 1 Encounters:  01/16/13 130 lb 15.3 oz (59.4 kg)    Ideal Body Weight: 52.2 kg   % Ideal Body Weight: 114%  Wt Readings from Last 10 Encounters:  01/16/13 130 lb 15.3 oz (59.4 kg)  01/16/13 130 lb 15.3 oz (59.4 kg)  01/03/13 128 lb (58.06 kg)  09/28/12 133 lb 1.9 oz (60.383 kg)  08/28/12 137 lb (62.143 kg)  06/26/12 134 lb (60.782 kg)  04/03/12 131 lb (59.421 kg)  02/27/12 130 lb (58.968 kg)   12/20/11 138 lb (62.596 kg)  11/18/11 131 lb (59.421 kg)    Usual Body Weight: 128-137 lb   % Usual Body Weight: 100%  BMI:  Body mass index is 23.2 kg/(m^2).  Estimated Nutritional Needs: Kcal: 1051 Protein: 75-90 grams Fluid: > 1.5 L/day  Skin: left head incision   Diet Order: NPO  EDUCATION NEEDS: -No education needs identified at this time   Intake/Output Summary (Last 24 hours) at 01/16/13 1101 Last data filed at 01/16/13 1100  Gross per 24 hour  Intake 3215.61 ml  Output   1775 ml  Net 1440.61 ml    Last BM: 12/3   Labs:   Recent Labs Lab 01/14/13 1050 2013/01/16 0956 01/16/2013 1310 16-Jan-2013 1408 01/16/13 0430  NA 142 139 140 141 147*  K 4.4 3.5 3.2* 3.5 3.1*  CL 102 100  --   --  114*  CO2 28 22  --   --  22  BUN 62* 54*  --   --  43*  CREATININE 1.61* 1.47*  --   --  1.21*  CALCIUM 9.4 9.2  --   --  7.3*  MG  --   --   --   --  1.7  PHOS  --   --   --   --  3.0  GLUCOSE 81 169*  --  215* 104*    CBG (last 3)  No results found for this basename: GLUCAP,  in the last 72 hours  Scheduled Meds: . carvedilol  6.25 mg Oral BID WC  . divalproex  125 mg Oral Q24H  . divalproex  250 mg Oral Custom  . doxazosin  8 mg Oral Daily  . febuxostat  40 mg Oral Daily  . levETIRAcetam  500 mg Intravenous Q12H  . pantoprazole (PROTONIX) IV  40 mg Intravenous QHS  . phytonadione (VITAMIN K) IV  10 mg Intravenous STAT  . potassium chloride  20 mEq Per Tube Daily  . prothrombin complex conc human (Kcentra) IVPB  2,218 Units Intravenous Once  . QUEtiapine  50 mg Oral QHS  . sertraline  50 mg Oral Daily    Continuous Infusions: . fentaNYL infusion INTRAVENOUS 25 mcg/hr (02/03/2013 2030)  . niCARDipine Stopped (02-03-13 1700)  . propofol 5 mcg/kg/min (03-Feb-2013 1833)    Past Medical History  Diagnosis Date  . CHF (congestive heart failure)   . Atrial fibrillation     paroxismal  . Hyperlipidemia   . Hypertension   . Allergic rhinitis   . Gout      Past Surgical History  Procedure Laterality Date  . Abdominal hysterectomy      no oophorectomy    Kendell Bane RD, LDN, CNSC [phone] Pager [phone] After Hours Pager

## 2013-01-16 NOTE — Progress Notes (Signed)
Subjective: Patient reports sedated, on vent  Objective: Vital signs in last 24 hours: Temp:  [97.3 F (36.3 C)-98.5 F (36.9 C)] 97.3 F (36.3 C) (12/03 0759) Pulse Rate:  [37-129] 85 (12/03 0500) Resp:  [14-27] 18 (12/03 0500) BP: (121-172)/(46-113) 149/66 mmHg (12/03 0500) SpO2:  [95 %-100 %] 98 % (12/03 0500) FiO2 (%):  [40 %-60 %] 40 % (12/03 0600) Weight:  [58 kg (127 lb 13.9 oz)-59.4 kg (130 lb 15.3 oz)] 59.4 kg (130 lb 15.3 oz) (12/03 0400)  Intake/Output from previous day: 12/02 0701 - 12/03 0700 In: 3135.6 [I.V.:1304.6; Blood:1176; NG/GT:150; IV Piggyback:255] Out: 1715 [Urine:1097; Drains:118; Blood:500] Intake/Output this shift:    Physical Exam: Opens eyes moves all extremities, PERRL  Lab Results:  Recent Labs  01/26/2013 0956  02/02/2013 1408 01/16/13 0430  WBC 7.7  --   --  10.2  HGB 10.3*  < > 9.5* 9.3*  HCT 31.2*  < > 28.0* 27.3*  PLT 190  --   --  140*  < > = values in this interval not displayed. BMET  Recent Labs  01/21/2013 0956  02/02/2013 1408 01/16/13 0430  NA 139  < > 141 147*  K 3.5  < > 3.5 3.1*  CL 100  --   --  114*  CO2 22  --   --  22  GLUCOSE 169*  --  215* 104*  BUN 54*  --   --  43*  CREATININE 1.47*  --   --  1.21*  CALCIUM 9.2  --   --  7.3*  < > = values in this interval not displayed.  Studies/Results: Ct Head Wo Contrast  01/16/2013   CLINICAL DATA:  Follow-up  EXAM: CT HEAD WITHOUT CONTRAST  TECHNIQUE: Contiguous axial images were obtained from the base of the skull through the vertex without intravenous contrast.  COMPARISON:  01/27/2013  FINDINGS: Interval left craniotomy and subdural evacuation. The quantity of extra-axial blood has decreased in size and there is increased extra-axial air. Residual left subdural hematoma along the left tent, cerebral convexity, and falx, posteriorly measuring up to 7 mm in thickness. Decreased left-to-right midline shift, now measures 4 mm. Decreased sulcal and basilar cistern effacement. No  definite acute infarction. White matter changes are similar to prior. A small amount of subdural blood on the right persists on image 17/34. Sinus wall thickening. Intubated.  IMPRESSION: Interval craniotomy and left subdural hematoma evacuation. Decreased mass effect/left-to-right midline shift, now measuring 4 mm. Small residual bilateral subdurals as above.   Electronically Signed   By: Jearld Lesch M.D.   On: 01/16/2013 04:21   Ct Head Wo Contrast  02/05/2013   CLINICAL DATA:  Altered mental status  EXAM: CT HEAD WITHOUT CONTRAST  TECHNIQUE: Contiguous axial images were obtained from the base of the skull through the vertex without intravenous contrast.  COMPARISON:  01/14/2013, 11/11/2012.  FINDINGS: There is a large hyperdense left extra-axial fluid collection along the left cerebral convexity measuring 2.6 cm in greatest thickness at the level of the left lateral ventricle. There is subdural hemorrhage layering along the tentorium. There is a small amount of subdural blood along the right parietal convexity. There is 10.3 mm of left-to-right midline shift with compression of the left lateral ventricle. There is sulcal effacement along the left cerebral hemisphere. There is partial effacement of the basal cistern.  Visualized portions of the orbits are unremarkable. There is mucosal thickening involving the bilateral maxillary sinuses, ethmoid sinuses and left frontal  sinus. There is a small air-fluid level in bilateral maxillary sinuses and sphenoid sinus. Intracranial vascular atherosclerotic disease is again noted.  The osseous structures are unremarkable.  IMPRESSION: Large left subdural hematoma measuring 2.6 cm in greatest thickness at the level of the left lateral ventricle. There is subdural hemorrhage layering along the tentorium and a small amount of subdural blood along the right parietal convexity. There is 10.3 mm of left-to-right midline shift with compression of the left lateral ventricle  and Severe sulcal effacement of the left cerebral hemisphere. These results were called by telephone at the time of interpretation on 2013/02/01 at 9:49 AM to Dr. Toy Cookey, who verbally acknowledged these results.   Electronically Signed   By: Elige Ko   On: 01-Feb-2013 09:51   Ct Head Wo Contrast  01/14/2013   CLINICAL DATA:  Fall with left facial trauma.  EXAM: CT HEAD WITHOUT CONTRAST  CT MAXILLOFACIAL WITHOUT CONTRAST  CT CERVICAL SPINE WITHOUT CONTRAST  TECHNIQUE: Multidetector CT imaging of the head, cervical spine, and maxillofacial structures were performed using the standard protocol without intravenous contrast. Multiplanar CT image reconstructions of the cervical spine and maxillofacial structures were also generated.  COMPARISON:  09/14/2012  FINDINGS: CT HEAD FINDINGS  Ventricles, cisterns and other CSF spaces are within normal as there is mild age related atrophic change. There is chronic ischemic microvascular disease present. There is no mass, mass effect, shift of midline structures or acute hemorrhage. No evidence of acute infarction. 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 There is moderate calcified atherosclerotic plaque over the cavernous segment of the internal carotid arteries as well as the vertebral arteries. There is evidence of prior sinus surgery and continued chronic sinus inflammatory disease with air-fluid levels over the maxillary sinuses as cannot exclude a acute component to patient's sinus disease.  CT MAXILLOFACIAL FINDINGS  There is focal swelling with small hematoma over the left temporal and lateral facial region. Orbits are normal and symmetric. There is moderate opacification over the paranasal sinuses with subtle air-fluid levels over the maxillary sinuses. These findings were present on the prior exam compatible with chronic sinus inflammatory disease. There findings suggesting prior sinus surgery with fenestration of the medial walls of the maxillary  sinuses. There is chronic deformity of the medial wall of the right orbit. There is no acute fracture. Minimal degenerative change of the temporomandibular joints. Remainder the exam is unchanged.  CT CERVICAL SPINE FINDINGS  Vertebral body and heights are within normal. There is mild spondylosis present. There is disc space narrowing at the C5-6 level. Prevertebral soft tissues as well as the atlantoaxial articulation are within normal. There is no acute fracture or subluxation. There is uncovertebral joint spurring and mild facet arthropathy. Neural foraminal narrowing is present at multiple levels. Remainder the exam is unchanged to include moderate heterogeneity with multiple hypodense thyroid nodules likely a goiter.  IMPRESSION: No acute intracranial findings.  No acute cervical spine injury.  Soft tissue swelling/hematoma over the lateral left mid face and temporal region. No acute fracture.  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  Moderate chronic sinus inflammatory disease.  Spondylosis of the cervical spine with disc disease at the C5-6 level  Stable multinodular thyroid likely goiter.   Electronically Signed   By: Elberta Fortis M.D.   On: 01/14/2013 11:03   Ct Cervical Spine Wo Contrast  01/14/2013   CLINICAL DATA:  Fall with left  facial trauma.  EXAM: CT HEAD WITHOUT CONTRAST  CT MAXILLOFACIAL WITHOUT CONTRAST  CT CERVICAL SPINE WITHOUT CONTRAST  TECHNIQUE: Multidetector CT imaging of the head, cervical spine, and maxillofacial structures were performed using the standard protocol without intravenous contrast. Multiplanar CT image reconstructions of the cervical spine and maxillofacial structures were also generated.  COMPARISON:  09/14/2012  FINDINGS: CT HEAD FINDINGS  Ventricles, cisterns and other CSF spaces are within normal as there is mild age related atrophic change. There is chronic ischemic microvascular  disease present. There is no mass, mass effect, shift of midline structures or acute hemorrhage. No evidence of acute infarction. 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 There is moderate calcified atherosclerotic plaque over the cavernous segment of the internal carotid arteries as well as the vertebral arteries. There is evidence of prior sinus surgery and continued chronic sinus inflammatory disease with air-fluid levels over the maxillary sinuses as cannot exclude a acute component to patient's sinus disease.  CT MAXILLOFACIAL FINDINGS  There is focal swelling with small hematoma over the left temporal and lateral facial region. Orbits are normal and symmetric. There is moderate opacification over the paranasal sinuses with subtle air-fluid levels over the maxillary sinuses. These findings were present on the prior exam compatible with chronic sinus inflammatory disease. 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 There is chronic deformity of the medial wall of the right orbit. There is no acute fracture. Minimal degenerative change of the temporomandibular joints. Remainder the exam is unchanged.  CT CERVICAL SPINE FINDINGS  Vertebral body and heights are within normal. There is mild spondylosis present. There is disc space narrowing at the C5-6 level. Prevertebral soft tissues as well as the atlantoaxial articulation are within normal. There is no acute fracture or subluxation. There is uncovertebral joint spurring and mild facet arthropathy. Neural foraminal narrowing is present at multiple levels. Remainder the exam is unchanged to include moderate heterogeneity with multiple hypodense thyroid nodules likely a goiter.  IMPRESSION: No acute intracranial findings.  No acute cervical spine injury.  Soft tissue swelling/hematoma over the lateral left mid face and temporal region. No acute fracture.  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  Moderate chronic sinus inflammatory disease.  Spondylosis of the cervical spine with disc disease at the C5-6 level  Stable multinodular thyroid likely goiter.   Electronically Signed   By: Elberta Fortis M.D.   On: 01/14/2013 11:03   Dg Chest Port 1 View  01/16/2013   CLINICAL DATA:  Intubation.  EXAM: PORTABLE CHEST - 1 VIEW  COMPARISON:  2013/02/12 .  FINDINGS: Endotracheal tube noted with its tip 4.1 cm above the carina. NG tube noted coiled in stomach. Previously identified right lower lobe and perihilar infiltrate has almost cleared. No pleural effusion or pneumothorax. Stable cardiomegaly.  IMPRESSION: 1. Stable support line positions. 2. Interim near complete clearing of right perihilar and right lower lobe infiltrate.   Electronically Signed   By: Maisie Fus  Register   On: 01/16/2013 07:51   Dg Chest Port 1 View  02/12/2013   CLINICAL DATA:  Endotracheal tube placement  EXAM: PORTABLE CHEST - 1 VIEW  COMPARISON:  02-12-2013 9:50 a.m.  FINDINGS: There is interval placement of endotracheal tube with the distal tip 3.8 cm from carinal. There is no pneumothorax. Nasogastric tube is identified coiled in the fundus of stomach. There is  mild patchy opacity of the medial right lung base. There is no pleural effusion. The aorta is tortuous. The heart size is enlarged.  IMPRESSION: Endotracheal tube in good position. There is no pneumothorax. Nasogastric tube is identified coiled in the fundus of stomach. Mild patchy opacity medial right lung base suspicious for pneumonia.   Electronically Signed   By: Sherian Rein M.D.   On: February 10, 2013 16:34   Dg Chest Portable 1 View  2013/02/10   CLINICAL DATA:  Altered mental status.  EXAM: PORTABLE CHEST - 1 VIEW  COMPARISON:  November 11, 2012.  FINDINGS: Stable cardiomegaly. No pneumothorax or pleural effusion is noted. No acute pulmonary disease is noted. The bony  thorax appears intact.  IMPRESSION: No acute cardiopulmonary abnormality seen.   Electronically Signed   By: Roque Lias M.D.   On: Feb 10, 2013 10:01   Ct Maxillofacial Wo Cm  01/14/2013   CLINICAL DATA:  Fall with left facial trauma.  EXAM: CT HEAD WITHOUT CONTRAST  CT MAXILLOFACIAL WITHOUT CONTRAST  CT CERVICAL SPINE WITHOUT CONTRAST  TECHNIQUE: Multidetector CT imaging of the head, cervical spine, and maxillofacial structures were performed using the standard protocol without intravenous contrast. Multiplanar CT image reconstructions of the cervical spine and maxillofacial structures were also generated.  COMPARISON:  09/14/2012  FINDINGS: CT HEAD FINDINGS  Ventricles, cisterns and other CSF spaces are within normal as there is mild age related atrophic change. There is chronic ischemic microvascular disease present. There is no mass, mass effect, shift of midline structures or acute hemorrhage. No evidence of acute infarction. 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 There is moderate calcified atherosclerotic plaque over the cavernous segment of the internal carotid arteries as well as the vertebral arteries. There is evidence of prior sinus surgery and continued chronic sinus inflammatory disease with air-fluid levels over the maxillary sinuses as cannot exclude a acute component to patient's sinus disease.  CT MAXILLOFACIAL FINDINGS  There is focal swelling with small hematoma over the left temporal and lateral facial region. Orbits are normal and symmetric. There is moderate opacification over the paranasal sinuses with subtle air-fluid levels over the maxillary sinuses. These findings were present on the prior exam compatible with chronic sinus inflammatory disease. 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 There is chronic deformity of the medial wall of the right orbit. There is no acute fracture. Minimal degenerative change  of the temporomandibular joints. Remainder the exam is unchanged.  CT CERVICAL SPINE FINDINGS  Vertebral body and heights are within normal. There is mild spondylosis present. There is disc space narrowing at the C5-6 level. Prevertebral soft tissues as well as the atlantoaxial articulation are within normal. There is no acute fracture or subluxation. There is uncovertebral joint spurring and mild facet arthropathy. Neural foraminal narrowing is present at multiple levels. Remainder the exam is unchanged to include moderate heterogeneity with multiple hypodense thyroid nodules likely a goiter.  IMPRESSION: No acute intracranial findings.  No acute cervical spine injury.  Soft tissue swelling/hematoma over the lateral left mid face and temporal region. No acute fracture.  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  Moderate chronic sinus inflammatory disease.  Spondylosis of the cervical spine with disc disease at the C5-6 level  Stable multinodular thyroid likely goiter.   Electronically Signed   By: Elberta Fortis M.D.   On: 01/14/2013 11:03  Assessment/Plan: Doing well following evacuation of SDH.  CT looks good. OK to wean and extubate per CCM.  I appreciate their assistance with medical issues.     LOS: 1 day    Dorian Heckle, MD 01/16/2013, 8:05 AM

## 2013-01-16 NOTE — Progress Notes (Signed)
PULMONARY  / CRITICAL CARE MEDICINE  Name: Sara Carlson MRN: [id number] DOB: 1922-08-07    ADMISSION DATE:  02/07/2013 CONSULTATION DATE:  01/14/2013  REFERRING MD :  Venetia Maxon PRIMARY SERVICE: Neurosurgery  CHIEF COMPLAINT:  Left subdural hematoma with coagulopathy.  BRIEF PATIENT DESCRIPTION: 79 year old female with history of CHF, A-fib, Hypertension and dementia admitted 12/2 with AMS and coagulopathy after a fall at nursing home on 12/1(CT scan 12/1 in ED negative for intracranial findings).  CT scan showed large left subdural hematoma with 10.3 cm left to right midline shift. CCM consulted for vent management post craniotomy/evacuation of hematoma.  SIGNIFICANT EVENTS / STUDIES:  12/1: CT Head: No acute intracranial findings ......................................................................................... 12/2: Admit post fall with CT Head:Large left subdural hematoma measuring 2.6 cm 12/2: Craniotomy: Evacuation of Left. Subdural Hematoma  LINES / TUBES:  ETT: 12/2>>>  CULTURES:  ANTIBIOTICS: None  SUBJECTIVE:  Non-responsive on vent, no sedation.  Not following commands but is moving extremities.  CT head this AM with improved midline shift, less edema.  VITAL SIGNS: Temp:  [97.3 F (36.3 C)-98.5 F (36.9 C)] 97.3 F (36.3 C) (12/03 0759) Pulse Rate:  [37-129] 43 (12/03 0900) Resp:  [14-27] 14 (12/03 0900) BP: (121-172)/(46-113) 150/58 mmHg (12/03 0900) SpO2:  [95 %-100 %] 100 % (12/03 0900) FiO2 (%):  [40 %-60 %] 40 % (12/03 0823) Weight:  [127 lb 13.9 oz (58 kg)-130 lb 15.3 oz (59.4 kg)] 130 lb 15.3 oz (59.4 kg) (12/03 0400)  HEMODYNAMICS:    VENTILATOR SETTINGS: Vent Mode:  [-] PRVC FiO2 (%):  [40 %-60 %] 40 % Set Rate:  [14 bmp] 14 bmp Vt Set:  [400 mL-420 mL] 400 mL PEEP:  [5 cmH20] 5 cmH20 Plateau Pressure:  [14 cmH20-18 cmH20] 16 cmH20  INTAKE / OUTPUT: Intake/Output     12/02 0701 - 12/03 0700 12/03 0701 - 12/04 0700   I.V. (mL/kg) 1304.6 (22)     Blood 1176    Other 250 40   NG/GT 150    IV Piggyback 255    Total Intake(mL/kg) 3135.6 (52.8) 40 (0.7)   Urine (mL/kg/hr) 1097 60 (0.4)   Drains 118    Blood 500    Total Output 1715 60   Net +1420.6 -20          PHYSICAL EXAMINATION: General:  Elderly female, on vent. Neuro: Unresponsive but withdraws to pain.  Non-purposeful movement of extremities.  Pupils pinpoint.  HEENT: Large dressing to left side of head, JP drain in place.  ETT, OGT in place. Cardiovascular: Irregular rate and rhythm, no M/R/G. Lungs: Scattered ronchi, decreased air movement. Abdomen: Soft, flat, non-tender, diminished BS. Musculoskeletal: No deformities.  Per RN, pt tender on left wrist (Xray pending). Skin: Intact with exception of left frontal craniotomy incision  LABS:  CBC  Recent Labs Lab 01/14/13 1050 02/09/2013 0956 01/19/2013 1310 02/13/2013 1408 01/16/13 0430  WBC 3.9* 7.7  --   --  10.2  HGB 12.3 10.3* 7.8* 9.5* 9.3*  HCT 37.2 31.2* 23.0* 28.0* 27.3*  PLT 196 190  --   --  140*   Coag's  Recent Labs Lab 01/24/2013 1303 01/18/2013 1840 01/16/13 0113 01/16/13 0653  APTT 34  --   --   --   INR 1.51* 1.34 1.20 1.33   BMET  Recent Labs Lab 01/14/13 1050 02/11/2013 0956 01/16/2013 1310 02/07/2013 1408 01/16/13 0430  NA 142 139 140 141 147*  K 4.4 3.5 3.2* 3.5 3.1*  CL  102 100  --   --  114*  CO2 28 22  --   --  22  BUN 62* 54*  --   --  43*  CREATININE 1.61* 1.47*  --   --  1.21*  GLUCOSE 81 169*  --  215* 104*   Electrolytes  Recent Labs Lab 01/14/13 1050 02-08-2013 0956 01/16/13 0430  CALCIUM 9.4 9.2 7.3*  MG  --   --  1.7  PHOS  --   --  3.0   ABG  Recent Labs Lab 02-08-13 1310 February 08, 2013 1649 01/16/13 0435  PHART 7.541* 7.465* 7.393  PCO2ART 30.9* 32.0* 32.0*  PO2ART 515.0* 63.0* 162.0*   Liver Enzymes  Recent Labs Lab 2013/02/08 0956  AST 17  ALT 6  ALKPHOS 69  BILITOT 0.5  ALBUMIN 3.3*    Imaging Ct Head Wo Contrast  01/16/2013   CLINICAL DATA:   Follow-up  EXAM: CT HEAD WITHOUT CONTRAST  TECHNIQUE: Contiguous axial images were obtained from the base of the skull through the vertex without intravenous contrast.  COMPARISON:  02-08-13  FINDINGS: Interval left craniotomy and subdural evacuation. The quantity of extra-axial blood has decreased in size and there is increased extra-axial air. Residual left subdural hematoma along the left tent, cerebral convexity, and falx, posteriorly measuring up to 7 mm in thickness. Decreased left-to-right midline shift, now measures 4 mm. Decreased sulcal and basilar cistern effacement. No definite acute infarction. White matter changes are similar to prior. A small amount of subdural blood on the right persists on image 17/34. Sinus wall thickening. Intubated.  IMPRESSION: Interval craniotomy and left subdural hematoma evacuation. Decreased mass effect/left-to-right midline shift, now measuring 4 mm. Small residual bilateral subdurals as above.   Electronically Signed   By: Jearld Lesch M.D.   On: 01/16/2013 04:21   Ct Head Wo Contrast  February 08, 2013   CLINICAL DATA:  Altered mental status  EXAM: CT HEAD WITHOUT CONTRAST  TECHNIQUE: Contiguous axial images were obtained from the base of the skull through the vertex without intravenous contrast.  COMPARISON:  01/14/2013, 11/11/2012.  FINDINGS: There is a large hyperdense left extra-axial fluid collection along the left cerebral convexity measuring 2.6 cm in greatest thickness at the level of the left lateral ventricle. There is subdural hemorrhage layering along the tentorium. There is a small amount of subdural blood along the right parietal convexity. There is 10.3 mm of left-to-right midline shift with compression of the left lateral ventricle. There is sulcal effacement along the left cerebral hemisphere. There is partial effacement of the basal cistern.  Visualized portions of the orbits are unremarkable. There is mucosal thickening involving the bilateral  maxillary sinuses, ethmoid sinuses and left frontal sinus. There is a small air-fluid level in bilateral maxillary sinuses and sphenoid sinus. Intracranial vascular atherosclerotic disease is again noted.  The osseous structures are unremarkable.  IMPRESSION: Large left subdural hematoma measuring 2.6 cm in greatest thickness at the level of the left lateral ventricle. There is subdural hemorrhage layering along the tentorium and a small amount of subdural blood along the right parietal convexity. There is 10.3 mm of left-to-right midline shift with compression of the left lateral ventricle and Severe sulcal effacement of the left cerebral hemisphere. These results were called by telephone at the time of interpretation on February 08, 2013 at 9:49 AM to Dr. Toy Cookey, who verbally acknowledged these results.   Electronically Signed   By: Elige Ko   On: 02-08-2013 09:51   Ct Head Wo Contrast  01/14/2013   CLINICAL DATA:  Fall with left facial trauma.  EXAM: CT HEAD WITHOUT CONTRAST  CT MAXILLOFACIAL WITHOUT CONTRAST  CT CERVICAL SPINE WITHOUT CONTRAST  TECHNIQUE: Multidetector CT imaging of the head, cervical spine, and maxillofacial structures were performed using the standard protocol without intravenous contrast. Multiplanar CT image reconstructions of the cervical spine and maxillofacial structures were also generated.  COMPARISON:  09/14/2012  FINDINGS: CT HEAD FINDINGS  Ventricles, cisterns and other CSF spaces are within normal as there is mild age related atrophic change. There is chronic ischemic microvascular disease present. There is no mass, mass effect, shift of midline structures or acute hemorrhage. No evidence of acute infarction. 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 There is moderate calcified atherosclerotic plaque over the cavernous segment of the internal carotid arteries as well as the vertebral arteries. There is evidence of prior sinus surgery and continued chronic sinus  inflammatory disease with air-fluid levels over the maxillary sinuses as cannot exclude a acute component to patient's sinus disease.  CT MAXILLOFACIAL FINDINGS  There is focal swelling with small hematoma over the left temporal and lateral facial region. Orbits are normal and symmetric. There is moderate opacification over the paranasal sinuses with subtle air-fluid levels over the maxillary sinuses. These findings were present on the prior exam compatible with chronic sinus inflammatory disease. 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 There is chronic deformity of the medial wall of the right orbit. There is no acute fracture. Minimal degenerative change of the temporomandibular joints. Remainder the exam is unchanged.  CT CERVICAL SPINE FINDINGS  Vertebral body and heights are within normal. There is mild spondylosis present. There is disc space narrowing at the C5-6 level. Prevertebral soft tissues as well as the atlantoaxial articulation are within normal. There is no acute fracture or subluxation. There is uncovertebral joint spurring and mild facet arthropathy. Neural foraminal narrowing is present at multiple levels. Remainder the exam is unchanged to include moderate heterogeneity with multiple hypodense thyroid nodules likely a goiter.  IMPRESSION: No acute intracranial findings.  No acute cervical spine injury.  Soft tissue swelling/hematoma over the lateral left mid face and temporal region. No acute fracture.  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  Moderate chronic sinus inflammatory disease.  Spondylosis of the cervical spine with disc disease at the C5-6 level  Stable multinodular thyroid likely goiter.   Electronically Signed   By: Elberta Fortis M.D.   On: 01/14/2013 11:03   Ct Cervical Spine Wo Contrast  01/14/2013   CLINICAL DATA:  Fall  with left facial trauma.  EXAM: CT HEAD WITHOUT CONTRAST  CT MAXILLOFACIAL WITHOUT CONTRAST  CT CERVICAL SPINE WITHOUT CONTRAST  TECHNIQUE: Multidetector CT imaging of the head, cervical spine, and maxillofacial structures were performed using the standard protocol without intravenous contrast. Multiplanar CT image reconstructions of the cervical spine and maxillofacial structures were also generated.  COMPARISON:  09/14/2012  FINDINGS: CT HEAD FINDINGS  Ventricles, cisterns and other CSF spaces are within normal as there is mild age related atrophic change. There is chronic ischemic microvascular disease present. There is no mass, mass effect, shift of midline structures or acute hemorrhage. No evidence of acute infarction. 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 There is moderate calcified atherosclerotic plaque over the cavernous segment of the internal carotid arteries as well as the vertebral arteries. There is  evidence of prior sinus surgery and continued chronic sinus inflammatory disease with air-fluid levels over the maxillary sinuses as cannot exclude a acute component to patient's sinus disease.  CT MAXILLOFACIAL FINDINGS  There is focal swelling with small hematoma over the left temporal and lateral facial region. Orbits are normal and symmetric. There is moderate opacification over the paranasal sinuses with subtle air-fluid levels over the maxillary sinuses. These findings were present on the prior exam compatible with chronic sinus inflammatory disease. 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 There is chronic deformity of the medial wall of the right orbit. There is no acute fracture. Minimal degenerative change of the temporomandibular joints. Remainder the exam is unchanged.  CT CERVICAL SPINE FINDINGS  Vertebral body and heights are within normal. There is mild spondylosis present. There is disc space narrowing at the  C5-6 level. Prevertebral soft tissues as well as the atlantoaxial articulation are within normal. There is no acute fracture or subluxation. There is uncovertebral joint spurring and mild facet arthropathy. Neural foraminal narrowing is present at multiple levels. Remainder the exam is unchanged to include moderate heterogeneity with multiple hypodense thyroid nodules likely a goiter.  IMPRESSION: No acute intracranial findings.  No acute cervical spine injury.  Soft tissue swelling/hematoma over the lateral left mid face and temporal region. No acute fracture.  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  Moderate chronic sinus inflammatory disease.  Spondylosis of the cervical spine with disc disease at the C5-6 level  Stable multinodular thyroid likely goiter.   Electronically Signed   By: Elberta Fortis M.D.   On: 01/14/2013 11:03   Dg Chest Port 1 View  01/16/2013   CLINICAL DATA:  Intubation.  EXAM: PORTABLE CHEST - 1 VIEW  COMPARISON:  01/27/2013 .  FINDINGS: Endotracheal tube noted with its tip 4.1 cm above the carina. NG tube noted coiled in stomach. Previously identified right lower lobe and perihilar infiltrate has almost cleared. No pleural effusion or pneumothorax. Stable cardiomegaly.  IMPRESSION: 1. Stable support line positions. 2. Interim near complete clearing of right perihilar and right lower lobe infiltrate.   Electronically Signed   By: Maisie Fus  Register   On: 01/16/2013 07:51   Dg Chest Port 1 View  01/26/2013   CLINICAL DATA:  Endotracheal tube placement  EXAM: PORTABLE CHEST - 1 VIEW  COMPARISON:  January 15, 2013 9:50 a.m.  FINDINGS: There is interval placement of endotracheal tube with the distal tip 3.8 cm from carinal. There is no pneumothorax. Nasogastric tube is identified coiled in the fundus of stomach. There is mild patchy opacity of the medial right lung base. There is no pleural effusion. The  aorta is tortuous. The heart size is enlarged.  IMPRESSION: Endotracheal tube in good position. There is no pneumothorax. Nasogastric tube is identified coiled in the fundus of stomach. Mild patchy opacity medial right lung base suspicious for pneumonia.   Electronically Signed   By: Sherian Rein M.D.   On: 01/17/2013 16:34   Dg Chest Portable 1 View  02/11/2013   CLINICAL DATA:  Altered mental status.  EXAM: PORTABLE CHEST - 1 VIEW  COMPARISON:  November 11, 2012.  FINDINGS: Stable cardiomegaly. No pneumothorax or pleural effusion is noted. No acute pulmonary disease is noted. The bony thorax appears intact.  IMPRESSION: No acute cardiopulmonary abnormality seen.   Electronically Signed   By: Fayrene Fearing  Green M.D.   On: 01/28/2013 10:01   Ct Maxillofacial Wo Cm  01/14/2013   CLINICAL DATA:  Fall with left facial trauma.  EXAM: CT HEAD WITHOUT CONTRAST  CT MAXILLOFACIAL WITHOUT CONTRAST  CT CERVICAL SPINE WITHOUT CONTRAST  TECHNIQUE: Multidetector CT imaging of the head, cervical spine, and maxillofacial structures were performed using the standard protocol without intravenous contrast. Multiplanar CT image reconstructions of the cervical spine and maxillofacial structures were also generated.  COMPARISON:  09/14/2012  FINDINGS: CT HEAD FINDINGS  Ventricles, cisterns and other CSF spaces are within normal as there is mild age related atrophic change. There is chronic ischemic microvascular disease present. There is no mass, mass effect, shift of midline structures or acute hemorrhage. No evidence of acute infarction. 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 There is moderate calcified atherosclerotic plaque over the cavernous segment of the internal carotid arteries as well as the vertebral arteries. There is evidence of prior sinus surgery and continued chronic sinus inflammatory disease with air-fluid levels over the maxillary sinuses as cannot exclude a acute component to patient's sinus disease.  CT  MAXILLOFACIAL FINDINGS  There is focal swelling with small hematoma over the left temporal and lateral facial region. Orbits are normal and symmetric. There is moderate opacification over the paranasal sinuses with subtle air-fluid levels over the maxillary sinuses. These findings were present on the prior exam compatible with chronic sinus inflammatory disease. 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 There is chronic deformity of the medial wall of the right orbit. There is no acute fracture. Minimal degenerative change of the temporomandibular joints. Remainder the exam is unchanged.  CT CERVICAL SPINE FINDINGS  Vertebral body and heights are within normal. There is mild spondylosis present. There is disc space narrowing at the C5-6 level. Prevertebral soft tissues as well as the atlantoaxial articulation are within normal. There is no acute fracture or subluxation. There is uncovertebral joint spurring and mild facet arthropathy. Neural foraminal narrowing is present at multiple levels. Remainder the exam is unchanged to include moderate heterogeneity with multiple hypodense thyroid nodules likely a goiter.  IMPRESSION: No acute intracranial findings.  No acute cervical spine injury.  Soft tissue swelling/hematoma over the lateral left mid face and temporal region. No acute fracture.  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  Moderate chronic sinus inflammatory disease.  Spondylosis of the cervical spine with disc disease at the C5-6 level  Stable multinodular thyroid likely goiter.   Electronically Signed   By: Elberta Fortis M.D.   On: 01/14/2013 11:03   ASSESSMENT / PLAN:  PULMONARY A:  Acute Respiratory Failure 2/2 Subdural hematoma requiring craniotomy. P: - Titrate O2 to maintain SAO2 of >92% - ABG and CXR in AM. - Begin PS but no extubation  given mental status.   CARDIOVASCULAR A: Atrial Fibrillation rate controlled Hypertension  CHF Hyperlipidemia P:  - No further anticoagulation as risk outweighs benefit (per NSGY). - Maintain B/P <160 systolic per neurosurgery. - Continue home Cardura and Coreg. - Strict I&O. - D/C lasix and zaroxolyn. - KVO IVF.  RENAL A:  Acute Renal Failure - improving. P:   - Trend BMET. - K repletion already ordered. - Monitor U/O.  GASTROINTESTINAL A: Protein-calorie malnutrition  P:   - PPI for sup. - Dietary consult for tube feeds starting today.  HEMATOLOGIC A:   Coagulopathy Post-op Anemia  P:  -Trend CBC -Trend PT/INR per pharmacy post Kcentra administration - No further anticoagulation as risk outweighs benefit.  INFECTIOUS A:  No acute issue. P:   - Monitor WBC/fever curve.  ENDOCRINE A:  At risk for hypoglycemia  P:  - CBG Q 4   NEUROLOGIC A:  Large Left Subdural Hematoma S/P Craniotomy AMS P:   - Post Craniotomy orders per NS - Neuro checks per NS  Rutherford Guys, PA - S  Patient not waking up, failed weaning this AM.  Hold diureses.  Start PS trials.  CC time 35 min.  Patient seen and examined, agree with above note.  I dictated the care and orders written for this patient under my direction.  Alyson Reedy, MD [phone]

## 2013-01-16 NOTE — Progress Notes (Addendum)
Respiratory therapy note- Attempted to wean this AM and again now. Low volumes and is still not following commands.

## 2013-01-17 ENCOUNTER — Inpatient Hospital Stay (HOSPITAL_COMMUNITY): Payer: PRIVATE HEALTH INSURANCE

## 2013-01-17 DIAGNOSIS — I2789 Other specified pulmonary heart diseases: Secondary | ICD-10-CM

## 2013-01-17 DIAGNOSIS — S52502A Unspecified fracture of the lower end of left radius, initial encounter for closed fracture: Secondary | ICD-10-CM

## 2013-01-17 LAB — BASIC METABOLIC PANEL
BUN: 53 mg/dL — ABNORMAL HIGH (ref 6–23)
CO2: 27 mEq/L (ref 19–32)
Chloride: 106 mEq/L (ref 96–112)
GFR calc Af Amer: 36 mL/min — ABNORMAL LOW (ref >90)
GFR calc non Af Amer: 31 mL/min — ABNORMAL LOW (ref >90)
Glucose, Bld: 162 mg/dL — ABNORMAL HIGH (ref 70–99)
Potassium: 3.1 mEq/L — ABNORMAL LOW (ref 3.5–5.1)
Sodium: 144 mEq/L (ref 135–145)

## 2013-01-17 LAB — CBC
HCT: 30.7 % — ABNORMAL LOW (ref 36.0–46.0)
Hemoglobin: 10.5 g/dL — ABNORMAL LOW (ref 12.0–15.0)
RBC: 3.49 MIL/uL — ABNORMAL LOW (ref 3.87–5.11)
WBC: 9.3 10*3/uL (ref 4.0–10.5)

## 2013-01-17 LAB — BLOOD GAS, ARTERIAL
Acid-Base Excess: 3.1 mmol/L — ABNORMAL HIGH (ref 0.0–2.0)
Bicarbonate: 27 mEq/L — ABNORMAL HIGH (ref 20.0–24.0)
FIO2: 0.4 %
PEEP: 5 cmH2O
Patient temperature: 98.6
TCO2: 28.3 mmol/L (ref 0–100)
pCO2 arterial: 40.7 mmHg (ref 35.0–45.0)
pH, Arterial: 7.438 (ref 7.350–7.450)
pO2, Arterial: 175 mmHg — ABNORMAL HIGH (ref 80.0–100.0)

## 2013-01-17 LAB — PROTIME-INR
INR: 1.36 (ref 0.00–1.49)
Prothrombin Time: 16.4 seconds — ABNORMAL HIGH (ref 11.6–15.2)

## 2013-01-17 LAB — GLUCOSE, CAPILLARY
Glucose-Capillary: 129 mg/dL — ABNORMAL HIGH (ref 70–99)
Glucose-Capillary: 133 mg/dL — ABNORMAL HIGH (ref 70–99)
Glucose-Capillary: 136 mg/dL — ABNORMAL HIGH (ref 70–99)
Glucose-Capillary: 153 mg/dL — ABNORMAL HIGH (ref 70–99)

## 2013-01-17 LAB — URINE CULTURE
Colony Count: NO GROWTH
Culture: NO GROWTH

## 2013-01-17 LAB — MAGNESIUM: Magnesium: 1.8 mg/dL (ref 1.5–2.5)

## 2013-01-17 MED ORDER — CHLORHEXIDINE GLUCONATE 0.12 % MT SOLN
15.0000 mL | Freq: Two times a day (BID) | OROMUCOSAL | Status: DC
  Administered 2013-01-17 – 2013-01-21 (×9): 15 mL via OROMUCOSAL
  Filled 2013-01-17 (×9): qty 15

## 2013-01-17 MED ORDER — BIOTENE DRY MOUTH MT LIQD
15.0000 mL | Freq: Four times a day (QID) | OROMUCOSAL | Status: DC
  Administered 2013-01-17 – 2013-01-21 (×16): 15 mL via OROMUCOSAL

## 2013-01-17 MED ORDER — POTASSIUM CHLORIDE 10 MEQ/100ML IV SOLN
10.0000 meq | INTRAVENOUS | Status: AC
  Administered 2013-01-17 (×2): 10 meq via INTRAVENOUS
  Filled 2013-01-17 (×2): qty 100

## 2013-01-17 MED ORDER — POTASSIUM CHLORIDE 20 MEQ/15ML (10%) PO LIQD
40.0000 meq | Freq: Two times a day (BID) | ORAL | Status: DC
  Administered 2013-01-17 – 2013-01-19 (×5): 40 meq
  Filled 2013-01-17 (×5): qty 30

## 2013-01-17 NOTE — Consult Note (Signed)
ORTHOPAEDIC CONSULTATION  REQUESTING PHYSICIAN: Maeola Harman, MD  Chief Complaint: Left wrist pain  HPI: Sara Carlson is a 79 y.o. female who had a fall with a subdural hematoma, on anticoagulation, requiring craniotomy and evacuation of hematoma, who has been withdrawing to pain from the left wrist. This is noticed by the nursing staff. Consultation was requested due to possible distal radius fracture. There is no family at the bedside, and the patient cannot give any history, and she is currently intubated. Details of this consultation are limited due to these factors, and are based on previous chart review findings.  Past Medical History  Diagnosis Date  . CHF (congestive heart failure)   . Atrial fibrillation     paroxismal  . Hyperlipidemia   . Hypertension   . Allergic rhinitis   . Gout    Past Surgical History  Procedure Laterality Date  . Abdominal hysterectomy      no oophorectomy   History   Social History  . Marital Status: Single    Spouse Name: N/A    Number of Children: N/A  . Years of Education: N/A   Social History Main Topics  . Smoking status: Never Smoker   . Smokeless tobacco: Never Used  . Alcohol Use: No     Comment: occasionally  . Drug Use: No  . Sexual Activity: None   Other Topics Concern  . None   Social History Narrative   Moved from [place] to [place] feb 2008   Lives in her apartment "independent living"   Doesn't drive   Daughter [name] is a friend of Dr.Nishan and works at [employer]         Family History  Problem Relation Age of Onset  . Colon cancer Neg Hx   . Breast cancer Neg Hx   . Diabetes Neg Hx   . Coronary artery disease Neg Hx   . Ataxia Neg Hx   . Chorea Neg Hx   . Mental retardation Neg Hx   . Migraines Neg Hx   . Multiple sclerosis Neg Hx   . Neurofibromatosis Neg Hx   . Neuropathy Neg Hx   . Parkinsonism Neg Hx   . Seizures Neg Hx    No Known Allergies Prior to Admission medications   Medication  Sig Start Date End Date Taking? Authorizing Provider  acetaminophen (TYLENOL) 500 MG tablet Take 1,000 mg by mouth every 6 (six) hours as needed for pain.   Yes Historical Provider, MD  carvedilol (COREG) 6.25 MG tablet take 1 tablet by mouth twice a day 03/06/12  Yes Wendall Stade, MD  divalproex (DEPAKOTE SPRINKLE) 125 MG capsule Take 125-250 mg by mouth 3 (three) times daily. Take 125mg  (1 capsule) at 8am and take 250mg  (2 capsule) at 12 noon and 4pm   Yes Historical Provider, MD  doxazosin (CARDURA) 8 MG tablet take 1 tablet by mouth at bedtime 05/05/12  Yes Wanda Plump, MD  febuxostat (ULORIC) 40 MG tablet take 1 tablet by mouth once daily 12/28/12  Yes Wanda Plump, MD  furosemide (LASIX) 40 MG tablet Take 1 tablet (40 mg total) by mouth daily. 12/06/11  Yes Wanda Plump, MD  LORazepam (ATIVAN) 0.5 MG tablet Take 0.5 mg by mouth every 6 (six) hours as needed for anxiety.   Yes Historical Provider, MD  metolazone (ZAROXOLYN) 2.5 MG tablet take 1 tablet by mouth once daily 09/01/12  Yes Lewayne Bunting, MD  potassium chloride SA (K-DUR,KLOR-CON)  20 MEQ tablet Take 1 tablet (20 mEq total) by mouth daily. 10/17/12  Yes Wendall Stade, MD  QUEtiapine (SEROQUEL) 50 MG tablet Take 50 mg by mouth at bedtime.   Yes Historical Provider, MD  sertraline (ZOLOFT) 50 MG tablet Take 50 mg by mouth daily.   Yes Historical Provider, MD  spironolactone (ALDACTONE) 25 MG tablet Take 12.5 mg by mouth every morning.  05/15/12  Yes Wanda Plump, MD  warfarin (COUMADIN) 2.5 MG tablet Take 2.5-5 mg by mouth daily. Take 1 tablet (2.5mg ) on Tuesday.  Take 2 tablets (5mg ) on monday, Wednesday, thursday, Friday, Saturday, sunday   Yes Historical Provider, MD   Dg Wrist Complete Left  01/16/2013   CLINICAL DATA:  Swelling.  EXAM: LEFT WRIST - COMPLETE 3+ VIEW  COMPARISON:  Left wrist series 8 08/2007.  FINDINGS: Vascular calcification noted. Vertical lucency noted in the distal aspect of the distal radial epiphysis with extension into  the joint space. This could represent a subtle fracture. This is nondisplaced. Old ulnar styloid fracture noted.  IMPRESSION: Possible subtle vertical fracture in the distal left radial epiphysis extending into the radiocarpal joint space. No displacement.   Electronically Signed   By: Maisie Fus  Register   On: 01/16/2013 10:58   Ct Head Wo Contrast  01/16/2013   CLINICAL DATA:  Follow-up  EXAM: CT HEAD WITHOUT CONTRAST  TECHNIQUE: Contiguous axial images were obtained from the base of the skull through the vertex without intravenous contrast.  COMPARISON:  2013/01/31  FINDINGS: Interval left craniotomy and subdural evacuation. The quantity of extra-axial blood has decreased in size and there is increased extra-axial air. Residual left subdural hematoma along the left tent, cerebral convexity, and falx, posteriorly measuring up to 7 mm in thickness. Decreased left-to-right midline shift, now measures 4 mm. Decreased sulcal and basilar cistern effacement. No definite acute infarction. White matter changes are similar to prior. A small amount of subdural blood on the right persists on image 17/34. Sinus wall thickening. Intubated.  IMPRESSION: Interval craniotomy and left subdural hematoma evacuation. Decreased mass effect/left-to-right midline shift, now measuring 4 mm. Small residual bilateral subdurals as above.   Electronically Signed   By: Jearld Lesch M.D.   On: 01/16/2013 04:21   Dg Chest Port 1 View  01/17/2013   CLINICAL DATA:  Evaluate airspace disease  EXAM: PORTABLE CHEST - 1 VIEW  COMPARISON:  Portable chest x-ray of 01/16/2013  FINDINGS: The lungs appear slightly better aerated. Minimal perihilar indistinctness remains. Cardiomegaly is stable. The endotracheal tube tip is well above the carina and and NG tube remains.  IMPRESSION: Little change to perhaps minimally improved aeration.   Electronically Signed   By: Dwyane Dee M.D.   On: 01/17/2013 08:05   Dg Chest Port 1 View  01/16/2013    CLINICAL DATA:  Intubation.  EXAM: PORTABLE CHEST - 1 VIEW  COMPARISON:  01-31-13 .  FINDINGS: Endotracheal tube noted with its tip 4.1 cm above the carina. NG tube noted coiled in stomach. Previously identified right lower lobe and perihilar infiltrate has almost cleared. No pleural effusion or pneumothorax. Stable cardiomegaly.  IMPRESSION: 1. Stable support line positions. 2. Interim near complete clearing of right perihilar and right lower lobe infiltrate.   Electronically Signed   By: Maisie Fus  Register   On: 01/16/2013 07:51   Dg Chest Port 1 View  January 31, 2013   CLINICAL DATA:  Endotracheal tube placement  EXAM: PORTABLE CHEST - 1 VIEW  COMPARISON:  2013-01-31  9:50 a.m.  FINDINGS: There is interval placement of endotracheal tube with the distal tip 3.8 cm from carinal. There is no pneumothorax. Nasogastric tube is identified coiled in the fundus of stomach. There is mild patchy opacity of the medial right lung base. There is no pleural effusion. The aorta is tortuous. The heart size is enlarged.  IMPRESSION: Endotracheal tube in good position. There is no pneumothorax. Nasogastric tube is identified coiled in the fundus of stomach. Mild patchy opacity medial right lung base suspicious for pneumonia.   Electronically Signed   By: Sherian Rein M.D.   On: 02/03/2013 16:34    Positive ROS: All other systems have been reviewed and were otherwise negative with the exception of those mentioned in the HPI and as above.  Physical Exam: General: Patient is sitting in bed, fairly frail, unresponsive except to pain, intubated Cardiovascular: Minimal pedal edema Respiratory: Patient is intubated GI: No organomegaly, abdomen is soft and non-tender Skin: No lesions in the area of chief complaint, there is not much in the way of bruising over the wrist. Neurologic: Eyes are asymmetric. Psychiatric: Patient is not competent for consent, and does not interact. Lymphatic: No axillary or cervical  lymphadenopathy  MUSCULOSKELETAL: Left wrist is painful to motion, as the patient does withdraw to motion. She does not withdraw to motion on the right side. I do not appreciate significant crepitance on the left wrist, and not much in the way of a hematoma.  The x-rays are very suspicious for a nondisplaced radial styloid fracture.    Assessment: Left radial styloid fracture, and the setting of intracerebral hemorrhage, status post fall, multiple other risk factors as indicated above.  Plan: This is an acute injury, however I suspect that her profound neurologic deficit is going to limit her function more than her wrist. Having said that, I do think that she has a fracture based on her response to motion there, and the radiographic appearance. Therefore I recommended a volar thumb spica splint for comfort, if she needs to have her hands protected in the in this to prevent her from pulling out any of her tubes or lines, and that is fine to place over the top of the splint.  I would anticipate splinting at least and chills she is neurologically capable of indicating a degree of pain in that wrist, but would anticipate 4-6 weeks of immobilization.  I will plan to check him periodically, during the course of her hospitalization, and would like to see her as an outpatient should she improve clinically.  When she does begin to mobilize, I would recommend using a platform walker, and not putting weight across the left wrist.    Eulas Post, MD Cell [phone]   01/17/2013 12:52 PM

## 2013-01-17 NOTE — Telephone Encounter (Signed)
PT'S  DAUGHTER  NOTIFIED . WANTED  TO LET  YOU KNOW   THE NEXT  DAY  AFTER  FALLING  WAS  FOUND  LETHARGIC   AND  WENT  TO [facility]  WAS  ADMITTED   FOUND TO  HAVE BLEEDING ON BRAIN IS  CURRENTLY   AT  [facility] WILL  FORWARD TO  TO DR NISHAN./CY

## 2013-01-17 NOTE — Progress Notes (Signed)
Pt sedation was turned off during the night. This morning pt was not tolerating the ventilator. Pt was coughing and biting the ETT. Pt was suctioned but that offered no relief. Sedation- Fentanyl resumed at . Will attempt to do another WUA later this morning.

## 2013-01-17 NOTE — Progress Notes (Signed)
Respiratory therapy note- placed back to full support.

## 2013-01-17 NOTE — Progress Notes (Signed)
Subjective: Patient reports (sedated, ventilator)  Objective: Vital signs in last 24 hours: Temp:  [97.7 F (36.5 C)-98.7 F (37.1 C)] 98.6 F (37 C) (12/04 0339) Pulse Rate:  [37-99] 41 (12/04 0600) Resp:  [14-21] 15 (12/04 0600) BP: (110-166)/(37-80) 129/44 mmHg (12/04 0600) SpO2:  [100 %] 100 % (12/04 0600) FiO2 (%):  [40 %] 40 % (12/04 0400) Weight:  [59.9 kg (132 lb 0.9 oz)] 59.9 kg (132 lb 0.9 oz) (12/04 0500)  Intake/Output from previous day: 12/03 0701 - 12/04 0700 In: 1017 [NG/GT:450; IV Piggyback:105] Out: 1790 [Urine:1770; Drains:20] Intake/Output this shift:    Not responsive -sedated, on vent. Pupils 1mm slow to react. Report of shoulders twitching during night ?seizure activity. JP patent ~68ml overnight. Drsg intact.  Fighting vent this am, sedation resumed. Unable to wean yesterday.  Lab Results:  Recent Labs  01/16/13 0430 01/17/13 0408  WBC 10.2 9.3  HGB 9.3* 10.5*  HCT 27.3* 30.7*  PLT 140* 151   BMET  Recent Labs  01/16/13 0430 01/17/13 0408  NA 147* 144  K 3.1* 3.1*  CL 114* 106  CO2 22 27  GLUCOSE 104* 162*  BUN 43* 53*  CREATININE 1.21* 1.46*  CALCIUM 7.3* 8.8    Studies/Results: Dg Wrist Complete Left  01/16/2013   CLINICAL DATA:  Swelling.  EXAM: LEFT WRIST - COMPLETE 3+ VIEW  COMPARISON:  Left wrist series 8 08/2007.  FINDINGS: Vascular calcification noted. Vertical lucency noted in the distal aspect of the distal radial epiphysis with extension into the joint space. This could represent a subtle fracture. This is nondisplaced. Old ulnar styloid fracture noted.  IMPRESSION: Possible subtle vertical fracture in the distal left radial epiphysis extending into the radiocarpal joint space. No displacement.   Electronically Signed   By: Maisie Fus  Register   On: 01/16/2013 10:58   Ct Head Wo Contrast  01/16/2013   CLINICAL DATA:  Follow-up  EXAM: CT HEAD WITHOUT CONTRAST  TECHNIQUE: Contiguous axial images were obtained from the base of the  skull through the vertex without intravenous contrast.  COMPARISON:  2013-01-21  FINDINGS: Interval left craniotomy and subdural evacuation. The quantity of extra-axial blood has decreased in size and there is increased extra-axial air. Residual left subdural hematoma along the left tent, cerebral convexity, and falx, posteriorly measuring up to 7 mm in thickness. Decreased left-to-right midline shift, now measures 4 mm. Decreased sulcal and basilar cistern effacement. No definite acute infarction. White matter changes are similar to prior. A small amount of subdural blood on the right persists on image 17/34. Sinus wall thickening. Intubated.  IMPRESSION: Interval craniotomy and left subdural hematoma evacuation. Decreased mass effect/left-to-right midline shift, now measuring 4 mm. Small residual bilateral subdurals as above.   Electronically Signed   By: Jearld Lesch M.D.   On: 01/16/2013 04:21   Ct Head Wo Contrast  01-21-13   CLINICAL DATA:  Altered mental status  EXAM: CT HEAD WITHOUT CONTRAST  TECHNIQUE: Contiguous axial images were obtained from the base of the skull through the vertex without intravenous contrast.  COMPARISON:  01/14/2013, 11/11/2012.  FINDINGS: There is a large hyperdense left extra-axial fluid collection along the left cerebral convexity measuring 2.6 cm in greatest thickness at the level of the left lateral ventricle. There is subdural hemorrhage layering along the tentorium. There is a small amount of subdural blood along the right parietal convexity. There is 10.3 mm of left-to-right midline shift with compression of the left lateral ventricle. There is sulcal effacement along  the left cerebral hemisphere. There is partial effacement of the basal cistern.  Visualized portions of the orbits are unremarkable. There is mucosal thickening involving the bilateral maxillary sinuses, ethmoid sinuses and left frontal sinus. There is a small air-fluid level in bilateral maxillary  sinuses and sphenoid sinus. Intracranial vascular atherosclerotic disease is again noted.  The osseous structures are unremarkable.  IMPRESSION: Large left subdural hematoma measuring 2.6 cm in greatest thickness at the level of the left lateral ventricle. There is subdural hemorrhage layering along the tentorium and a small amount of subdural blood along the right parietal convexity. There is 10.3 mm of left-to-right midline shift with compression of the left lateral ventricle and Severe sulcal effacement of the left cerebral hemisphere. These results were called by telephone at the time of interpretation on 02/07/2013 at 9:49 AM to Dr. Toy Cookey, who verbally acknowledged these results.   Electronically Signed   By: Elige Ko   On: 02/12/2013 09:51   Dg Chest Port 1 View  01/16/2013   CLINICAL DATA:  Intubation.  EXAM: PORTABLE CHEST - 1 VIEW  COMPARISON:  01/19/2013 .  FINDINGS: Endotracheal tube noted with its tip 4.1 cm above the carina. NG tube noted coiled in stomach. Previously identified right lower lobe and perihilar infiltrate has almost cleared. No pleural effusion or pneumothorax. Stable cardiomegaly.  IMPRESSION: 1. Stable support line positions. 2. Interim near complete clearing of right perihilar and right lower lobe infiltrate.   Electronically Signed   By: Maisie Fus  Register   On: 01/16/2013 07:51   Dg Chest Port 1 View  02/01/2013   CLINICAL DATA:  Endotracheal tube placement  EXAM: PORTABLE CHEST - 1 VIEW  COMPARISON:  January 15, 2013 9:50 a.m.  FINDINGS: There is interval placement of endotracheal tube with the distal tip 3.8 cm from carinal. There is no pneumothorax. Nasogastric tube is identified coiled in the fundus of stomach. There is mild patchy opacity of the medial right lung base. There is no pleural effusion. The aorta is tortuous. The heart size is enlarged.  IMPRESSION: Endotracheal tube in good position. There is no pneumothorax. Nasogastric tube is identified coiled in  the fundus of stomach. Mild patchy opacity medial right lung base suspicious for pneumonia.   Electronically Signed   By: Sherian Rein M.D.   On: 01/14/2013 16:34   Dg Chest Portable 1 View  01/31/2013   CLINICAL DATA:  Altered mental status.  EXAM: PORTABLE CHEST - 1 VIEW  COMPARISON:  November 11, 2012.  FINDINGS: Stable cardiomegaly. No pneumothorax or pleural effusion is noted. No acute pulmonary disease is noted. The bony thorax appears intact.  IMPRESSION: No acute cardiopulmonary abnormality seen.   Electronically Signed   By: Roque Lias M.D.   On: 02/04/2013 10:01    Assessment/Plan:   LOS: 2 days  Continue support. Will request Ortho c/s for possible left distal radius Fx. Appreciate CCM's assistance.   Sara Carlson 01/17/2013, 8:00 AM

## 2013-01-17 NOTE — Progress Notes (Signed)
PULMONARY  / CRITICAL CARE MEDICINE  Name: Sara Carlson MRN: [id number] DOB: Feb 02, 1923    ADMISSION DATE:  02/04/2013 CONSULTATION DATE:  01/24/2013  REFERRING MD :  Venetia Maxon PRIMARY SERVICE: Neurosurgery  CHIEF COMPLAINT:  Left subdural hematoma with coagulopathy.  BRIEF PATIENT DESCRIPTION: 79 year old female with history of CHF, A-fib, Hypertension and dementia admitted 12/2 with AMS and coagulopathy after a fall at nursing home on 12/1(CT scan 12/1 in ED negative for intracranial findings).  CT scan showed large left subdural hematoma with 10.3 cm left to right midline shift. CCM consulted for vent management post craniotomy/evacuation of hematoma.  SIGNIFICANT EVENTS / STUDIES:  12/1: CT Head: No acute intracranial findings ......................................................................................... 12/2: Admit post fall with CT Head:Large left subdural hematoma measuring 2.6 cm 12/2: Craniotomy: Evacuation of Left. Subdural Hematoma  LINES / TUBES:  ETT: 12/2>>>  CULTURES:  ANTIBIOTICS: None  SUBJECTIVE:  Withdraws to pain but not arousable or following any command.  VITAL SIGNS: Temp:  [97.7 F (36.5 C)-98.7 F (37.1 C)] 98.6 F (37 C) (12/04 0339) Pulse Rate:  [37-99] 43 (12/04 1000) Resp:  [12-21] 14 (12/04 1000) BP: (110-166)/(37-69) 139/44 mmHg (12/04 1000) SpO2:  [100 %] 100 % (12/04 1000) FiO2 (%):  [40 %] 40 % (12/04 1000) Weight:  [59.9 kg (132 lb 0.9 oz)] 59.9 kg (132 lb 0.9 oz) (12/04 0500)  HEMODYNAMICS:    VENTILATOR SETTINGS: Vent Mode:  [-] PSV;CPAP FiO2 (%):  [40 %] 40 % Set Rate:  [14 bmp] 14 bmp Vt Set:  [400 mL] 400 mL PEEP:  [5 cmH20] 5 cmH20 Pressure Support:  [5 cmH20] 5 cmH20 Plateau Pressure:  [16 cmH20] 16 cmH20  INTAKE / OUTPUT: Intake/Output     12/03 0701 - 12/04 0700 12/04 0701 - 12/05 0700   I.V. (mL/kg)  60 (1)   Blood     Other 462    NG/GT 450 60   IV Piggyback 105    Total Intake(mL/kg) 1017 (17) 120 (2)    Urine (mL/kg/hr) 1770 (1.2) 310 (1.5)   Drains 20 (0)    Blood     Total Output 1790 310   Net -773 -190         PHYSICAL EXAMINATION: General:  Elderly female, on vent.  Opens eyes to pain but not to command. Neuro: Unresponsive but withdraws to pain.  Non-purposeful movement of extremities.  Pupils pinpoint.  HEENT: Large dressing to left side of head, JP drain in place.  ETT, OGT in place. Cardiovascular: Irregular rate and rhythm, no M/R/G. Lungs: Scattered ronchi, decreased air movement. Abdomen: Soft, flat, non-tender, diminished BS. Musculoskeletal: No deformities.  Per RN, pt tender on left wrist (Xray pending). Skin: Intact with exception of left frontal craniotomy incision  LABS:  CBC  Recent Labs Lab 01/30/2013 0956  02/03/2013 1408 01/16/13 0430 01/17/13 0408  WBC 7.7  --   --  10.2 9.3  HGB 10.3*  < > 9.5* 9.3* 10.5*  HCT 31.2*  < > 28.0* 27.3* 30.7*  PLT 190  --   --  140* 151  < > = values in this interval not displayed. Coag's  Recent Labs Lab 02/03/2013 1303  01/16/13 0653 01/16/13 1240 01/17/13 0408  APTT 34  --   --   --   --   INR 1.51*  < > 1.33 1.25 1.36  < > = values in this interval not displayed. BMET  Recent Labs Lab 01/30/2013 [phone]  22-Jan-2013 1408 01/16/13 0430  01/17/13 0408  NA 139  < > 141 147* 144  K 3.5  < > 3.5 3.1* 3.1*  CL 100  --   --  114* 106  CO2 22  --   --  22 27  BUN 54*  --   --  43* 53*  CREATININE 1.47*  --   --  1.21* 1.46*  GLUCOSE 169*  --  215* 104* 162*  < > = values in this interval not displayed. Electrolytes  Recent Labs Lab Feb 03, 2013 0956 01/16/13 0430 01/17/13 0408 01/17/13 0935  CALCIUM 9.2 7.3* 8.8  --   MG  --  1.7  --  1.8  PHOS  --  3.0  --   --    ABG  Recent Labs Lab 2013-02-03 1649 01/16/13 0435 01/17/13 0500  PHART 7.465* 7.393 7.438  PCO2ART 32.0* 32.0* 40.7  PO2ART 63.0* 162.0* 175.0*   Liver Enzymes  Recent Labs Lab 2013-02-03 0956  AST 17  ALT 6  ALKPHOS 69  BILITOT 0.5   ALBUMIN 3.3*    Imaging Dg Wrist Complete Left  01/16/2013   CLINICAL DATA:  Swelling.  EXAM: LEFT WRIST - COMPLETE 3+ VIEW  COMPARISON:  Left wrist series 8 08/2007.  FINDINGS: Vascular calcification noted. Vertical lucency noted in the distal aspect of the distal radial epiphysis with extension into the joint space. This could represent a subtle fracture. This is nondisplaced. Old ulnar styloid fracture noted.  IMPRESSION: Possible subtle vertical fracture in the distal left radial epiphysis extending into the radiocarpal joint space. No displacement.   Electronically Signed   By: Maisie Fus  Register   On: 01/16/2013 10:58   Ct Head Wo Contrast  01/16/2013   CLINICAL DATA:  Follow-up  EXAM: CT HEAD WITHOUT CONTRAST  TECHNIQUE: Contiguous axial images were obtained from the base of the skull through the vertex without intravenous contrast.  COMPARISON:  2013-02-03  FINDINGS: Interval left craniotomy and subdural evacuation. The quantity of extra-axial blood has decreased in size and there is increased extra-axial air. Residual left subdural hematoma along the left tent, cerebral convexity, and falx, posteriorly measuring up to 7 mm in thickness. Decreased left-to-right midline shift, now measures 4 mm. Decreased sulcal and basilar cistern effacement. No definite acute infarction. White matter changes are similar to prior. A small amount of subdural blood on the right persists on image 17/34. Sinus wall thickening. Intubated.  IMPRESSION: Interval craniotomy and left subdural hematoma evacuation. Decreased mass effect/left-to-right midline shift, now measuring 4 mm. Small residual bilateral subdurals as above.   Electronically Signed   By: Jearld Lesch M.D.   On: 01/16/2013 04:21   Dg Chest Port 1 View  01/17/2013   CLINICAL DATA:  Evaluate airspace disease  EXAM: PORTABLE CHEST - 1 VIEW  COMPARISON:  Portable chest x-ray of 01/16/2013  FINDINGS: The lungs appear slightly better aerated. Minimal  perihilar indistinctness remains. Cardiomegaly is stable. The endotracheal tube tip is well above the carina and and NG tube remains.  IMPRESSION: Little change to perhaps minimally improved aeration.   Electronically Signed   By: Dwyane Dee M.D.   On: 01/17/2013 08:05   Dg Chest Port 1 View  01/16/2013   CLINICAL DATA:  Intubation.  EXAM: PORTABLE CHEST - 1 VIEW  COMPARISON:  02/03/2013 .  FINDINGS: Endotracheal tube noted with its tip 4.1 cm above the carina. NG tube noted coiled in stomach. Previously identified right lower lobe and perihilar infiltrate has almost cleared. No pleural effusion or  pneumothorax. Stable cardiomegaly.  IMPRESSION: 1. Stable support line positions. 2. Interim near complete clearing of right perihilar and right lower lobe infiltrate.   Electronically Signed   By: Maisie Fus  Register   On: 01/16/2013 07:51   Dg Chest Port 1 View  Jan 31, 2013   CLINICAL DATA:  Endotracheal tube placement  EXAM: PORTABLE CHEST - 1 VIEW  COMPARISON:  31-Jan-2013 9:50 a.m.  FINDINGS: There is interval placement of endotracheal tube with the distal tip 3.8 cm from carinal. There is no pneumothorax. Nasogastric tube is identified coiled in the fundus of stomach. There is mild patchy opacity of the medial right lung base. There is no pleural effusion. The aorta is tortuous. The heart size is enlarged.  IMPRESSION: Endotracheal tube in good position. There is no pneumothorax. Nasogastric tube is identified coiled in the fundus of stomach. Mild patchy opacity medial right lung base suspicious for pneumonia.   Electronically Signed   By: Sherian Rein M.D.   On: 01-31-2013 16:34   ASSESSMENT / PLAN:  PULMONARY A:  Acute Respiratory Failure 2/2 Subdural hematoma requiring craniotomy. P: - Titrate O2 to maintain SAO2 of >92% - ABG and CXR in AM. - Continue PS trials but no extubation given mental status.Marland Kitchen   CARDIOVASCULAR A: Atrial Fibrillation rate controlled Hypertension   CHF Hyperlipidemia P:  - No further anticoagulation as risk outweighs benefit (per NSGY). - Maintain B/P <160 systolic per neurosurgery. - Continue home Cardura and Coreg. - Strict I&O. - D/Ced lasix and zaroxolyn, continue to hold. - KVO IVF.  RENAL A:  Acute Renal Failure - improving. P:   - Trend BMET. - K repletion already ordered. - Monitor U/O.  GASTROINTESTINAL A: Protein-calorie malnutrition  P:   - PPI for sup. - Continue TF.  HEMATOLOGIC A:   Coagulopathy addressed Post-op Anemia P:  -Trend CBC - No further anticoagulation as risk outweighs benefit.  INFECTIOUS A:  No acute issue. P:   - Monitor WBC/fever curve.  ENDOCRINE A:  At risk for hypoglycemia  P:  - CBG Q 4   NEUROLOGIC A:  Large Left Subdural Hematoma S/P Craniotomy AMS P:   - Post Craniotomy orders per NS - Neuro checks per NS  Need to address code status with family when available to address desire for reintubation/trach/peg...etc.  NS has been communicating with family, will defer to NS at this point.  CC time 35 min.  Alyson Reedy, M.D. [facility] Pulmonary/Critical Care Medicine. Pager: [phone]. After hours pager: [phone].

## 2013-01-17 NOTE — Progress Notes (Signed)
Orthopedic Tech Progress Note Patient Details:  Sara Carlson 24-Oct-1922 [id number]  Ortho Devices Type of Ortho Device: Thumb spica splint Ortho Device/Splint Interventions: Application   Cammer, Mickie Bail 01/17/2013, 1:33 PM

## 2013-01-17 NOTE — Progress Notes (Signed)
Wean vent as tolerated.  Distal radius fracture mgt per Dr. Dion Saucier

## 2013-01-17 NOTE — Progress Notes (Addendum)
Pt. have intermittent twitching of the lt. Shoulder, that lasted only for seconds, and resolved on its own,  pts. DBP is running in the low 40's. and cont. to have bigemeny on and off in the monitor Dr. Danielle Dess made aware  with orders made. With order to go by the  A-line pressure since cuff pressure at times runs low with MAP of <60. Will cont. to monitor pt.

## 2013-01-18 ENCOUNTER — Inpatient Hospital Stay (HOSPITAL_COMMUNITY): Payer: PRIVATE HEALTH INSURANCE

## 2013-01-18 ENCOUNTER — Encounter (HOSPITAL_COMMUNITY): Payer: Self-pay | Admitting: Neurosurgery

## 2013-01-18 LAB — BLOOD GAS, ARTERIAL
Bicarbonate: 27.5 mEq/L — ABNORMAL HIGH (ref 20.0–24.0)
Drawn by: 34764
FIO2: 0.4 %
MECHVT: 400 mL
PEEP: 5 cmH2O
Patient temperature: 97.6
RATE: 14 resp/min
pCO2 arterial: 44.9 mmHg (ref 35.0–45.0)
pH, Arterial: 7.401 (ref 7.350–7.450)

## 2013-01-18 LAB — GLUCOSE, CAPILLARY
Glucose-Capillary: 159 mg/dL — ABNORMAL HIGH (ref 70–99)
Glucose-Capillary: 136 mg/dL — ABNORMAL HIGH (ref 70–99)
Glucose-Capillary: 116 mg/dL — ABNORMAL HIGH (ref 70–99)

## 2013-01-18 LAB — CBC
MCH: 29.6 pg (ref 26.0–34.0)
MCV: 90.5 fL (ref 78.0–100.0)
Platelets: 167 10*3/uL (ref 150–400)
RBC: 3.38 MIL/uL — ABNORMAL LOW (ref 3.87–5.11)
RDW: 15.7 % — ABNORMAL HIGH (ref 11.5–15.5)

## 2013-01-18 LAB — BASIC METABOLIC PANEL
BUN: 63 mg/dL — ABNORMAL HIGH (ref 6–23)
CO2: 27 mEq/L (ref 19–32)
Calcium: 8.9 mg/dL (ref 8.4–10.5)
Creatinine, Ser: 1.42 mg/dL — ABNORMAL HIGH (ref 0.50–1.10)
Potassium: 4.2 mEq/L (ref 3.5–5.1)

## 2013-01-18 LAB — MAGNESIUM: Magnesium: 2 mg/dL (ref 1.5–2.5)

## 2013-01-18 MED ORDER — SODIUM PHOSPHATE 3 MMOLE/ML IV SOLN
30.0000 mmol | Freq: Once | INTRAVENOUS | Status: AC
  Administered 2013-01-18: 30 mmol via INTRAVENOUS
  Filled 2013-01-18: qty 10

## 2013-01-18 NOTE — Progress Notes (Signed)
UR completed.  Marnesha Gagen, RN BSN [facility] CCM Trauma/Neuro ICU Case Manager [phone]  

## 2013-01-18 NOTE — Progress Notes (Signed)
Subjective: Patient reports (intubated, vent support)  Objective: Vital signs in last 24 hours: Temp:  [97.3 F (36.3 C)-99.7 F (37.6 C)] 97.7 F (36.5 C) (12/05 0745) Pulse Rate:  [41-92] 86 (12/05 0825) Resp:  [12-33] 33 (12/05 0825) BP: (116-142)/(40-70) 139/49 mmHg (12/05 0800) SpO2:  [100 %] 100 % (12/05 0825) FiO2 (%):  [40 %] 40 % (12/05 0800) Weight:  [60.9 kg (134 lb 4.2 oz)] 60.9 kg (134 lb 4.2 oz) (12/05 0600)  Intake/Output from previous day: 12/04 0701 - 12/05 0700 In: 1612.5 [I.V.:67.5; NG/GT:810; IV Piggyback:515] Out: 1870 [Urine:1830; Drains:40] Intake/Output this shift: Total I/O In: 50 [Other:20; NG/GT:30] Out: 110 [Urine:110]  Without change today. Rests peacefully with Fentanyl, grimaces & bites tube when sedation stopped for assessment. JP with minimal drainage. Left wrist now with spica splint.  Lab Results:  Recent Labs  01/17/13 0408 01/18/13 0345  WBC 9.3 9.2  HGB 10.5* 10.0*  HCT 30.7* 30.6*  PLT 151 167   BMET  Recent Labs  01/17/13 0408 01/18/13 0345  NA 144 147*  K 3.1* 4.2  CL 106 110  CO2 27 27  GLUCOSE 162* 167*  BUN 53* 63*  CREATININE 1.46* 1.42*  CALCIUM 8.8 8.9    Studies/Results: Dg Wrist Complete Left  01/16/2013   CLINICAL DATA:  Swelling.  EXAM: LEFT WRIST - COMPLETE 3+ VIEW  COMPARISON:  Left wrist series 8 08/2007.  FINDINGS: Vascular calcification noted. Vertical lucency noted in the distal aspect of the distal radial epiphysis with extension into the joint space. This could represent a subtle fracture. This is nondisplaced. Old ulnar styloid fracture noted.  IMPRESSION: Possible subtle vertical fracture in the distal left radial epiphysis extending into the radiocarpal joint space. No displacement.   Electronically Signed   By: Maisie Fus  Register   On: 01/16/2013 10:58   Dg Chest Port 1 View  01/18/2013   CLINICAL DATA:  Endotracheal tube position  EXAM: PORTABLE CHEST - 1 VIEW  COMPARISON:  01/17/2013.  FINDINGS:  Endotracheal tube in good position.  NG tube in the stomach.  Progressive atelectasis in the left lower lobe. Right lung remains clear. Negative for edema.  IMPRESSION: Progressive left lower lobe atelectasis. Endotracheal tube remains in good position.   Electronically Signed   By: Marlan Palau M.D.   On: 01/18/2013 08:15   Dg Chest Port 1 View  01/17/2013   CLINICAL DATA:  Evaluate airspace disease  EXAM: PORTABLE CHEST - 1 VIEW  COMPARISON:  Portable chest x-ray of 01/16/2013  FINDINGS: The lungs appear slightly better aerated. Minimal perihilar indistinctness remains. Cardiomegaly is stable. The endotracheal tube tip is well above the carina and and NG tube remains.  IMPRESSION: Little change to perhaps minimally improved aeration.   Electronically Signed   By: Dwyane Dee M.D.   On: 01/17/2013 08:05    Assessment/Plan:   LOS: 3 days  Continue support; per Dr. Venetia Maxon, pull JP.    Georgiann Cocker 01/18/2013, 8:41 AM

## 2013-01-18 NOTE — Progress Notes (Signed)
PULMONARY  / CRITICAL CARE MEDICINE  Name: Sara Carlson MRN: [id number] DOB: 06-May-1922    ADMISSION DATE:  Feb 11, 2013 CONSULTATION DATE:  02-11-13  REFERRING MD :  Venetia Maxon PRIMARY SERVICE: Neurosurgery  CHIEF COMPLAINT:  Left subdural hematoma with coagulopathy.  BRIEF PATIENT DESCRIPTION: 79 year old female with history of CHF, A-fib, Hypertension and dementia admitted 12/2 with AMS and coagulopathy after a fall at nursing home on 12/1(CT scan 12/1 in ED negative for intracranial findings).  CT scan showed large left subdural hematoma with 10.3 cm left to right midline shift. CCM consulted for vent management post craniotomy/evacuation of hematoma.  SIGNIFICANT EVENTS / STUDIES:  12/1: CT Head: No acute intracranial findings 12/2: Admit post fall with CT Head:Large left subdural hematoma measuring 2.6 cm 12/2: Craniotomy: Evacuation of Left. Subdural Hematoma  LINES / TUBES:  ETT: 12/2>>>  CULTURES: None  ANTIBIOTICS: None  SUBJECTIVE:  Withdraws to pain but not arousable or following any command.  VITAL SIGNS: Temp:  [97.3 F (36.3 C)-99.7 F (37.6 C)] 97.7 F (36.5 C) (12/05 0745) Pulse Rate:  [41-92] 86 (12/05 0825) Resp:  [14-33] 33 (12/05 0825) BP: (116-142)/(40-70) 139/49 mmHg (12/05 0800) SpO2:  [100 %] 100 % (12/05 0825) FiO2 (%):  [40 %] 40 % (12/05 0800) Weight:  [60.9 kg (134 lb 4.2 oz)] 60.9 kg (134 lb 4.2 oz) (12/05 0600)  HEMODYNAMICS:    VENTILATOR SETTINGS: Vent Mode:  [-] PRVC FiO2 (%):  [40 %] 40 % Set Rate:  [14 bmp] 14 bmp Vt Set:  [400 mL] 400 mL PEEP:  [5 cmH20] 5 cmH20 Plateau Pressure:  [13 cmH20-15 cmH20] 15 cmH20  INTAKE / OUTPUT: Intake/Output     12/04 0701 - 12/05 0700 12/05 0701 - 12/06 0700   I.V. (mL/kg) 67.5 (1.1)    Other 220 20   NG/GT 810 30   IV Piggyback 515    Total Intake(mL/kg) 1612.5 (26.5) 50 (0.8)   Urine (mL/kg/hr) 1830 (1.3) 110 (0.5)   Drains 40 (0)    Total Output 1870 110   Net -257.5 -60        Stool  Occurrence 1 x     PHYSICAL EXAMINATION: General:  Elderly female, on vent.  Opens eyes to pain but not to command. Neuro: Unresponsive but withdraws to pain.  Non-purposeful movement of extremities.  Pupils pinpoint.  HEENT: Large dressing to left side of head, JP drain in place.  ETT, OGT in place. Cardiovascular: Irregular rate and rhythm, no M/R/G. Lungs: Scattered ronchi, decreased air movement. Abdomen: Soft, flat, non-tender, diminished BS. Musculoskeletal: No deformities.  Per RN, pt tender on left wrist (Xray pending). Skin: Intact with exception of left frontal craniotomy incision  LABS:  CBC  Recent Labs Lab 01/16/13 0430 01/17/13 0408 01/18/13 0345  WBC 10.2 9.3 9.2  HGB 9.3* 10.5* 10.0*  HCT 27.3* 30.7* 30.6*  PLT 140* 151 167   Coag's  Recent Labs Lab 02-11-2013 1303  01/16/13 1240 01/17/13 0408 01/18/13 0345  APTT 34  --   --   --   --   INR 1.51*  < > 1.25 1.36 1.49  < > = values in this interval not displayed. BMET  Recent Labs Lab 01/16/13 0430 01/17/13 0408 01/18/13 0345  NA 147* 144 147*  K 3.1* 3.1* 4.2  CL 114* 106 110  CO2 22 27 27   BUN 43* 53* 63*  CREATININE 1.21* 1.46* 1.42*  GLUCOSE 104* 162* 167*   Electrolytes  Recent Labs  Lab 01/16/13 0430 01/17/13 0408 01/17/13 0935 01/18/13 0345  CALCIUM 7.3* 8.8  --  8.9  MG 1.7  --  1.8 2.0  PHOS 3.0  --   --  2.0*   ABG  Recent Labs Lab 01/16/13 0435 01/17/13 0500 01/18/13 0345  PHART 7.393 7.438 7.401  PCO2ART 32.0* 40.7 44.9  PO2ART 162.0* 175.0* 185.0*   Liver Enzymes  Recent Labs Lab January 29, 2013 0956  AST 17  ALT 6  ALKPHOS 69  BILITOT 0.5  ALBUMIN 3.3*   Imaging Dg Chest Port 1 View  01/18/2013   CLINICAL DATA:  Endotracheal tube position  EXAM: PORTABLE CHEST - 1 VIEW  COMPARISON:  01/17/2013.  FINDINGS: Endotracheal tube in good position.  NG tube in the stomach.  Progressive atelectasis in the left lower lobe. Right lung remains clear. Negative for edema.   IMPRESSION: Progressive left lower lobe atelectasis. Endotracheal tube remains in good position.   Electronically Signed   By: Marlan Palau M.D.   On: 01/18/2013 08:15   Dg Chest Port 1 View  01/17/2013   CLINICAL DATA:  Evaluate airspace disease  EXAM: PORTABLE CHEST - 1 VIEW  COMPARISON:  Portable chest x-ray of 01/16/2013  FINDINGS: The lungs appear slightly better aerated. Minimal perihilar indistinctness remains. Cardiomegaly is stable. The endotracheal tube tip is well above the carina and and NG tube remains.  IMPRESSION: Little change to perhaps minimally improved aeration.   Electronically Signed   By: Dwyane Dee M.D.   On: 01/17/2013 08:05   ASSESSMENT / PLAN:  PULMONARY A:  Acute Respiratory Failure 2/2 Subdural hematoma requiring craniotomy. P: - Titrate O2 to maintain SAO2 of >92% - ABG and CXR in AM. - Continue PS trials but no extubation given mental status. - Neurosurg has been communicating with family, family is evidently very realistic and is waiting for NS to give prognosis to determine plan of care.  If prognosis is poor (which I imagine will be) then comfort care would be the recommendation from PCCM standpoint.   CARDIOVASCULAR A: Atrial Fibrillation rate controlled Hypertension  CHF Hyperlipidemia P:  - No further anticoagulation as risk outweighs benefit (per NSGY). - Maintain B/P <160 systolic per neurosurgery. - Continue home Cardura and Coreg. - Strict I&O. - D/Ced lasix and zaroxolyn, continue to hold. - KVO IVF.  RENAL A:  Acute Renal Failure - improving.  Hypophos. P:   - Trend BMET. - K repletion already ordered. - Monitor U/O. - Replace phos IV.  GASTROINTESTINAL A: Protein-calorie malnutrition  P:   - PPI for sup. - Continue TF.  HEMATOLOGIC A:   Coagulopathy addressed Post-op Anemia P:  -Trend CBC - No further anticoagulation as risk outweighs benefit.  INFECTIOUS A:  No acute issue. P:   - Monitor WBC/fever  curve.  ENDOCRINE A:  At risk for hypoglycemia  P:  - CBG Q 4   NEUROLOGIC A:  Large Left Subdural Hematoma S/P Craniotomy AMS P:   - Post Craniotomy orders per NS - Neuro checks per NS  CC time 35 min.  Alyson Reedy, M.D. [facility] Pulmonary/Critical Care Medicine. Pager: [phone]. After hours pager: [phone].

## 2013-01-18 NOTE — Progress Notes (Signed)
Continue support and vent weaning.

## 2013-01-18 NOTE — Progress Notes (Signed)
Patient ID: Sara Carlson, female   DOB: 1922/09/17, 79 y.o.   MRN: [id number]  JP pulled per Dr. Fredrich Birks order. Pt withdraws, grimacing from pain, but calms quickly.  Incisions open to air, without erythema, swelling, or drainage.  Georgiann Cocker, RN, BSN

## 2013-01-19 ENCOUNTER — Inpatient Hospital Stay (HOSPITAL_COMMUNITY): Payer: PRIVATE HEALTH INSURANCE

## 2013-01-19 LAB — BASIC METABOLIC PANEL
CO2: 28 mEq/L (ref 19–32)
Calcium: 8.9 mg/dL (ref 8.4–10.5)
Chloride: 113 mEq/L — ABNORMAL HIGH (ref 96–112)
Creatinine, Ser: 1.19 mg/dL — ABNORMAL HIGH (ref 0.50–1.10)
Glucose, Bld: 172 mg/dL — ABNORMAL HIGH (ref 70–99)

## 2013-01-19 LAB — TYPE AND SCREEN
ABO/RH(D): O POS
Unit division: 0
Unit division: 0
Unit division: 0
Unit division: 0
Unit division: 0
Unit division: 0

## 2013-01-19 LAB — GLUCOSE, CAPILLARY
Glucose-Capillary: 108 mg/dL — ABNORMAL HIGH (ref 70–99)
Glucose-Capillary: 156 mg/dL — ABNORMAL HIGH (ref 70–99)
Glucose-Capillary: 139 mg/dL — ABNORMAL HIGH (ref 70–99)
Glucose-Capillary: 133 mg/dL — ABNORMAL HIGH (ref 70–99)
Glucose-Capillary: 132 mg/dL — ABNORMAL HIGH (ref 70–99)

## 2013-01-19 LAB — BLOOD GAS, ARTERIAL
Acid-Base Excess: 3.3 mmol/L — ABNORMAL HIGH (ref 0.0–2.0)
Bicarbonate: 27.8 mEq/L — ABNORMAL HIGH (ref 20.0–24.0)
O2 Saturation: 99.7 %
PEEP: 5 cmH2O
Patient temperature: 98.6
TCO2: 29.3 mmol/L (ref 0–100)
pO2, Arterial: 191 mmHg — ABNORMAL HIGH (ref 80.0–100.0)

## 2013-01-19 LAB — CBC
HCT: 30.4 % — ABNORMAL LOW (ref 36.0–46.0)
Hemoglobin: 9.7 g/dL — ABNORMAL LOW (ref 12.0–15.0)
MCH: 29.7 pg (ref 26.0–34.0)
MCHC: 31.9 g/dL (ref 30.0–36.0)
MCV: 93 fL (ref 78.0–100.0)
Platelets: 165 10*3/uL (ref 150–400)
RBC: 3.27 MIL/uL — ABNORMAL LOW (ref 3.87–5.11)
WBC: 8.6 10*3/uL (ref 4.0–10.5)

## 2013-01-19 LAB — PHOSPHORUS: Phosphorus: 3.3 mg/dL (ref 2.3–4.6)

## 2013-01-19 NOTE — Progress Notes (Signed)
PULMONARY  / CRITICAL CARE MEDICINE  Name: Sara Carlson MRN: [id number] DOB: 1922-07-17    ADMISSION DATE:  01/29/2013 CONSULTATION DATE:  01-29-13  REFERRING MD :  Venetia Maxon PRIMARY SERVICE: Neurosurgery  CHIEF COMPLAINT:  Left subdural hematoma with coagulopathy.  BRIEF PATIENT DESCRIPTION: 79 year old female with history of CHF, A-fib, Hypertension and dementia admitted 12/2 with AMS and coagulopathy after a fall at nursing home on 12/1(CT scan 12/1 in ED negative for intracranial findings).  CT scan showed large left subdural hematoma with 10.3 cm left to right midline shift. CCM consulted for vent management post craniotomy/evacuation of hematoma.  SIGNIFICANT EVENTS / STUDIES:  12/1: CT Head: No acute intracranial findings 12/2: Admit post fall with CT Head:Large left subdural hematoma measuring 2.6 cm 12/2: Craniotomy: Evacuation of Left. Subdural Hematoma  LINES / TUBES:  ETT: 12/2>>>  CULTURES: None  ANTIBIOTICS: None  SUBJECTIVE:  Comatose  VITAL SIGNS: Temp:  [97.5 F (36.4 C)-98.8 F (37.1 C)] 97.8 F (36.6 C) (12/06 1600) Pulse Rate:  [33-93] 81 (12/06 1700) Resp:  [11-20] 14 (12/06 1700) BP: (135-161)/(50-81) 142/71 mmHg (12/06 1700) SpO2:  [99 %-100 %] 100 % (12/06 1700) FiO2 (%):  [30 %-40 %] 30 % (12/06 1721) Weight:  [61.2 kg (134 lb 14.7 oz)] 61.2 kg (134 lb 14.7 oz) (12/06 0300)  HEMODYNAMICS:    VENTILATOR SETTINGS: Vent Mode:  [-] PRVC FiO2 (%):  [30 %-40 %] 30 % Set Rate:  [14 bmp] 14 bmp Vt Set:  [400 mL] 400 mL PEEP:  [5 cmH20] 5 cmH20 Pressure Support:  [5 cmH20] 5 cmH20 Plateau Pressure:  [10 cmH20-15 cmH20] 15 cmH20  INTAKE / OUTPUT: Intake/Output     12/05 0701 - 12/06 0700 12/06 0701 - 12/07 0700   I.V. (mL/kg) 90.6 (1.5) 22.1 (0.4)   Other 100 120   NG/GT 690 270   IV Piggyback 450 105   Total Intake(mL/kg) 1330.6 (21.7) 517.1 (8.4)   Urine (mL/kg/hr) 1275 (0.9) 645 (1)   Drains     Total Output 1275 645   Net +55.6 -127.9         PHYSICAL EXAMINATION: General:  Comatose Neuro: Unresponsive but withdraws to pain.  Cardiovascular: Irregular rate and rhythm, no M/R/G. Lungs: Scattered ronchi, decreased air movement. Abdomen: Soft, flat, non-tender, diminished BS. Musculoskeletal: No edema  LABS:  I have reviewed all of today's lab results. Relevant abnormalities are discussed in the A/P section CXR: improved LLL aeration   ASSESSMENT / PLAN:  PULMONARY A:  Acute Respiratory Failure 2/2 Subdural hematoma requiring craniotomy. P: Cont PSV as tolerated Family pondering one way extubation   CARDIOVASCULAR A: Atrial Fibrillation rate controlled Hypertension  CHF Hyperlipidemia P:  Cont current rx  RENAL A:  Acute Renal Failure - improving.  Hypophos. P:   Monitor BMET intermittently Correct electrolytes as indicated   GASTROINTESTINAL A: Protein-calorie malnutrition  P:   - PPI for sup. - Continue TF.  HEMATOLOGIC A:   Coagulopathy addressed Post-op Anemia P:  -Trend CBC - No further anticoagulation  INFECTIOUS A:  No acute issue. P:   - Monitor WBC/fever curve.  ENDOCRINE A:  At risk for hypoglycemia  P:  - CBG Q 4   NEUROLOGIC A:  Large Left Subdural Hematoma S/P Craniotomy AMS P:   - Post Craniotomy orders per NS - Neuro checks per NS   Awaiting input from family   Billy Fischer, MD ; [facility] service Mobile [phone].  After 5:30 PM or weekends,  call [phone]

## 2013-01-19 NOTE — Progress Notes (Signed)
Subjective: Patient reports no significant improvement  Objective: Vital signs in last 24 hours: Temp:  [97 F (36.1 C)-98.8 F (37.1 C)] 98.5 F (36.9 C) (12/06 0400) Pulse Rate:  [33-93] 93 (12/06 0900) Resp:  [13-20] 19 (12/06 0900) BP: (127-155)/(43-76) 154/69 mmHg (12/06 0900) SpO2:  [99 %-100 %] 100 % (12/06 0900) FiO2 (%):  [30 %-40 %] 30 % (12/06 0930) Weight:  [61.2 kg (134 lb 14.7 oz)] 61.2 kg (134 lb 14.7 oz) (12/06 0300)  Intake/Output from previous day: 12/05 0701 - 12/06 0700 In: 1330.6 [I.V.:90.6; NG/GT:690; IV Piggyback:450] Out: 1275 [Urine:1275] Intake/Output this shift:    Physical Exam: Opens eyes and grimaces to pain, moves extremities to noxious stimuli, not following commands.  Lab Results:  Recent Labs  01/18/13 0345 01/19/13 0540  WBC 9.2 8.6  HGB 10.0* 9.7*  HCT 30.6* 30.4*  PLT 167 165   BMET  Recent Labs  01/18/13 0345 01/19/13 0540  NA 147* 149*  K 4.2 4.2  CL 110 113*  CO2 27 28  GLUCOSE 167* 172*  BUN 63* 64*  CREATININE 1.42* 1.19*  CALCIUM 8.9 8.9    Studies/Results: Dg Chest Port 1 View  01/19/2013   CLINICAL DATA:  Acute respiratory failure.  EXAM: PORTABLE CHEST - 1 VIEW  COMPARISON:  01/18/2013  FINDINGS: Endotracheal tube and NG tube appear in good position, unchanged. Chronic cardiomegaly. Significant clearing of the atelectasis at the left lung base. Persistent peribronchial thickening.  IMPRESSION: Marked improvement in left base atelectasis.   Electronically Signed   By: Geanie Cooley M.D.   On: 01/19/2013 08:06   Dg Chest Port 1 View  01/18/2013   CLINICAL DATA:  Endotracheal tube position  EXAM: PORTABLE CHEST - 1 VIEW  COMPARISON:  01/17/2013.  FINDINGS: Endotracheal tube in good position.  NG tube in the stomach.  Progressive atelectasis in the left lower lobe. Right lung remains clear. Negative for edema.  IMPRESSION: Progressive left lower lobe atelectasis. Endotracheal tube remains in good position.    Electronically Signed   By: Marlan Palau M.D.   On: 01/18/2013 08:15    Assessment/Plan: Patient has not made a lot of progress following evacuation of SDH with good post-operative radiographic result.  I d/w patient's daughter regarding respecting patient's wishes expressed in advance directives and we can do rapid wean and extubation per CCM versus withdrawal of support.  Patient has expressed desire to not go to Nursing Home or to be in vegetative state and would not want trach and feeding tube.    LOS: 4 days    Dorian Heckle, MD 01/19/2013, 9:55 AM

## 2013-01-20 MED ORDER — ALBUTEROL SULFATE (5 MG/ML) 0.5% IN NEBU
2.5000 mg | INHALATION_SOLUTION | RESPIRATORY_TRACT | Status: DC | PRN
  Administered 2013-01-20: 2.5 mg via RESPIRATORY_TRACT
  Filled 2013-01-20: qty 0.5

## 2013-01-20 MED ORDER — MORPHINE SULFATE 2 MG/ML IJ SOLN
2.0000 mg | INTRAMUSCULAR | Status: DC | PRN
  Administered 2013-01-20 – 2013-01-21 (×5): 2 mg via INTRAVENOUS
  Filled 2013-01-20 (×5): qty 1

## 2013-01-20 MED ORDER — WHITE PETROLATUM GEL
Status: AC
  Administered 2013-01-20: 18:00:00
  Filled 2013-01-20: qty 5

## 2013-01-20 MED ORDER — ACETAMINOPHEN 650 MG RE SUPP: 650.0000 mg | RECTAL | Status: DC | PRN

## 2013-01-20 MED ORDER — SODIUM CHLORIDE 0.9 % IV SOLN
INTRAVENOUS | Status: DC
  Administered 2013-01-20: 22:00:00 via INTRAVENOUS

## 2013-01-20 MED ORDER — ACETAMINOPHEN 160 MG/5ML PO SOLN
650.0000 mg | ORAL | Status: DC | PRN
  Administered 2013-01-20: 650 mg
  Filled 2013-01-20: qty 20.3

## 2013-01-20 NOTE — Progress Notes (Signed)
Subjective: Patient reports off sedation.  Withdraws to pain and moves spontaneously  Objective: Vital signs in last 24 hours: Temp:  [97.7 F (36.5 C)-100.3 F (37.9 C)] 98.6 F (37 C) (12/07 0800) Pulse Rate:  [44-108] 90 (12/07 0900) Resp:  [11-22] 15 (12/07 0900) BP: (113-171)/(37-97) 127/97 mmHg (12/07 0900) SpO2:  [99 %-100 %] 100 % (12/07 0900) FiO2 (%):  [30 %] 30 % (12/07 0800) Weight:  [59.7 kg (131 lb 9.8 oz)] 59.7 kg (131 lb 9.8 oz) (12/07 0400)  Intake/Output from previous day: 12/06 0701 - 12/07 0700 In: 1329.6 [I.V.:59.6; NG/GT:940; IV Piggyback:210] Out: 1465 [Urine:1465] Intake/Output this shift: Total I/O In: 65 [I.V.:5; NG/GT:60] Out: 80 [Urine:80]  Physical Exam: Opens eyes and moves to painful stimuli.  Lab Results:  Recent Labs  01/18/13 0345 01/19/13 0540  WBC 9.2 8.6  HGB 10.0* 9.7*  HCT 30.6* 30.4*  PLT 167 165   BMET  Recent Labs  01/18/13 0345 01/19/13 0540  NA 147* 149*  K 4.2 4.2  CL 110 113*  CO2 27 28  GLUCOSE 167* 172*  BUN 63* 64*  CREATININE 1.42* 1.19*  CALCIUM 8.9 8.9    Studies/Results: Dg Chest Port 1 View  01/19/2013   CLINICAL DATA:  Acute respiratory failure.  EXAM: PORTABLE CHEST - 1 VIEW  COMPARISON:  01/18/2013  FINDINGS: Endotracheal tube and NG tube appear in good position, unchanged. Chronic cardiomegaly. Significant clearing of the atelectasis at the left lung base. Persistent peribronchial thickening.  IMPRESSION: Marked improvement in left base atelectasis.   Electronically Signed   By: Geanie Cooley M.D.   On: 01/19/2013 08:06    Assessment/Plan: Discussed with daughter.  Will wean and extubate today per CCM and will not plan to re-intubate.  No trach. Continue supportive care.    LOS: 5 days    Dorian Heckle, MD 01/20/2013, 9:43 AM

## 2013-01-20 NOTE — Progress Notes (Signed)
Tylenol order changed to elixir for fever  DC sugar checks; sugar 160mg % and no hx of DM and anticipated terminal wean   Dr. Kalman Shan, M.D., [facility].C.P Pulmonary and Critical Care Medicine Staff Physician [facility] [place] [facility] Pager: [phone], If no answer or between  15:00h - 7:00h: call [phone]  01/20/2013 12:33 AM

## 2013-01-20 NOTE — Procedures (Signed)
Extubation Procedure Note  Patient Details:   Name: Sara Carlson DOB: 06-02-1922 MRN: [id number]   Airway Documentation:     Evaluation  O2 sats: stable throughout Complications: No apparent complications Patient did tolerate procedure well. Bilateral Breath Sounds: Diminished;Rhonchi Suctioning: Airway No, s/p CVA, able to lift head off bed spontaneously, NIF -20, + cuff leak, unable to perform FVC, placed on 2 lpm n/c, no Stridor noted. Tolerated well, RT to monitor.  [name] 01/20/2013, 1:23 PM

## 2013-01-20 NOTE — Progress Notes (Signed)
Fentanyl drip 10 mcg/ml in 250 ml d/c'd per order & wasted in sink - 100 ml given, 150 ml wasted, witnessed by Ferman Hamming, RN.

## 2013-01-20 NOTE — Progress Notes (Signed)
PULMONARY  / CRITICAL CARE MEDICINE  Name: Sara Carlson MRN: [id number] DOB: 1922/05/30    ADMISSION DATE:  02-12-13 CONSULTATION DATE:  2013-02-12  REFERRING MD :  Venetia Maxon PRIMARY SERVICE: Neurosurgery  CHIEF COMPLAINT:  Left subdural hematoma with coagulopathy.  BRIEF PATIENT DESCRIPTION: 79 year old female with history of CHF, A-fib, Hypertension and dementia admitted 12/2 with AMS and coagulopathy after a fall at nursing home on 12/1(CT scan 12/1 in ED negative for intracranial findings).  CT scan showed large left subdural hematoma with 10.3 cm left to right midline shift. CCM consulted for vent management post craniotomy/evacuation of hematoma.  SIGNIFICANT EVENTS / STUDIES:  12/1: CT Head: No acute intracranial findings 12/2: Admit post fall with CT Head:Large left subdural hematoma measuring 2.6 cm 12/2: Craniotomy: Evacuation of Left. Subdural Hematoma  LINES / TUBES:  ETT: 12/2>> 12/07  CULTURES: None  ANTIBIOTICS: None  SUBJECTIVE:  withdraws  VITAL SIGNS: Temp:  [97.6 F (36.4 C)-100.3 F (37.9 C)] 97.6 F (36.4 C) (12/07 1925) Pulse Rate:  [44-120] 108 (12/07 1900) Resp:  [12-26] 24 (12/07 1900) BP: (113-168)/(37-101) 136/62 mmHg (12/07 1900) SpO2:  [99 %-100 %] 100 % (12/07 1900) FiO2 (%):  [30 %] 30 % (12/07 1200) Weight:  [59.7 kg (131 lb 9.8 oz)] 59.7 kg (131 lb 9.8 oz) (12/07 0400)  HEMODYNAMICS:    VENTILATOR SETTINGS: Vent Mode:  [-] PSV;CPAP FiO2 (%):  [30 %] 30 % Set Rate:  [14 bmp] 14 bmp Vt Set:  [400 mL] 400 mL PEEP:  [5 cmH20] 5 cmH20 Pressure Support:  [5 cmH20-10 cmH20] 10 cmH20 Plateau Pressure:  [6 cmH20-18 cmH20] 18 cmH20  INTAKE / OUTPUT: Intake/Output     12/07 0701 - 12/08 0700   I.V. (mL/kg) 5 (0.1)   Other    NG/GT 60   IV Piggyback 105   Total Intake(mL/kg) 170 (2.8)   Urine (mL/kg/hr) 510 (0.6)   Total Output 510   Net -340        PHYSICAL EXAMINATION: General:  Minimally responsive Neuro: Unresponsive but  withdraws to pain.  Cardiovascular: Irregular rate and rhythm, no M/R/G. Lungs: Scattered ronchi, decreased air movement. Abdomen: Soft, flat, non-tender, diminished BS. Musculoskeletal: No edema  LABS:  I have reviewed all of today's lab results. Relevant abnormalities are discussed in the A/P section   CXR: NNF   ASSESSMENT / PLAN:  PULMONARY A:  Acute Respiratory Failure 2/2 Subdural hematoma requiring craniotomy. P: One way extubation 12/07 Discussed at length process and what to expect with pt's daughter   CARDIOVASCULAR A: Atrial Fibrillation rate controlled Hypertension  CHF Hyperlipidemia P:  Cont current rx  RENAL A:  Acute Renal Failure P:   Monitor BMET intermittently Correct electrolytes as indicated   GASTROINTESTINAL A: Protein-calorie malnutrition  P:   - PPI for sup. - Continue TF.  HEMATOLOGIC A:   Coagulopathy addressed Post-op Anemia P:  -Trend CBC - No further anticoagulation  INFECTIOUS A:  No acute issue. P:   - Monitor WBC/fever curve.  ENDOCRINE A:  At risk for hypoglycemia  P:  - CBG Q 4   NEUROLOGIC A:  Large Left Subdural Hematoma S/P Craniotomy AMS P:   - Post Craniotomy orders per NS - Neuro checks per NS   Billy Fischer, MD ; [facility] [phone].  After 5:30 PM or weekends, call [phone]

## 2013-01-21 MED ORDER — MORPHINE SULFATE 10 MG/ML IJ SOLN
1.0000 mg/h | INTRAVENOUS | Status: DC
  Administered 2013-01-21: 4 mg/h via INTRAVENOUS
  Filled 2013-01-21 (×2): qty 10

## 2013-01-21 MED ORDER — SODIUM CHLORIDE 0.9 % IV SOLN
1.0000 mg/h | INTRAVENOUS | Status: DC
  Administered 2013-01-21 – 2013-01-22 (×2): 8 mg/h via INTRAVENOUS
  Filled 2013-01-21 (×2): qty 10

## 2013-01-21 MED ORDER — MORPHINE SULFATE 10 MG/ML IJ SOLN: 1.0000 mg/h | INTRAVENOUS | Status: DC

## 2013-01-21 MED ORDER — MORPHINE BOLUS VIA INFUSION
5.0000 mg | INTRAVENOUS | Status: DC | PRN
  Filled 2013-01-21: qty 20

## 2013-01-21 NOTE — Significant Event (Signed)
Pt comfort care.  Will d/c all meds/interventions not needed for comfort.  Will arrange for transfer to inpatient palliative care room.  Coralyn Helling, MD 01/21/2013, 4:10 PM

## 2013-01-21 NOTE — Progress Notes (Addendum)
Pt transferred via bed from 3M14 to 6N24.  Receiving RN at bedside.  Pt's daughter Jose Persia [phone]) called and updated regarding pt's transfer.

## 2013-01-21 NOTE — Clinical Social Work Note (Signed)
Clinical Social Work Department BRIEF PSYCHOSOCIAL ASSESSMENT 01/21/2013  Patient:  Sara Carlson, Sara Carlson     Account Number:  [id number]     Admit date:  01/26/2013  Clinical Social Worker:  Verl Blalock  Date/Time:  01/21/2013 10:11 AM  Referred by:  Physician  Date Referred:  01/21/2013 Referred for  SNF Placement   Other Referral:   Interview type:  Family Other interview type:   Spoke to patient daughter over the phone    PSYCHOSOCIAL DATA Living Status:  FACILITY Admitted from facility:  [facility] Level of care:  Assisted Living Primary support name:  Jose Persia  [phone] / [phone] Primary support relationship to patient:  CHILD, ADULT Degree of support available:   Strong    CURRENT CONCERNS Current Concerns  Post-Acute Placement  Adjustment to Illness   Other Concerns:    SOCIAL WORK ASSESSMENT / PLAN Clinical Social Worker spoke with patient daughter  over the phone to offer support and discuss patient needs at discharge.  Patient daughter states that patient is a current resident at [facility]. Patient daughter understands that patient may not be able to return to this level of care at discharge - patient daughter agreeable to further discuss SNF options.  FL2 complete and patient ready to fax out as needed.  Patient daughter plans to meet with palliative care services to determine patient plan of care prior to discharge.  CSW remains available for support and to facilitate patient discharge needs once medically stable.   Assessment/plan status:  Psychosocial Support/Ongoing Assessment of Needs Other assessment/ plan:   Information/referral to community resources:   No resources provided at this time due to assessment being completed over the phone.  CSW did provide patient daughter with CSW direct contact information.    PATIENT'S/FAMILY'S RESPONSE TO PLAN OF CARE: Patient is currently obtunded at this time but does remain  off the ventilator.  Patient daughter is the child care director at [facility] and offers a good amount of support to patient.  Patient from a memory care unit, however patient family realistic regarding patient potential long term plans.  Patient daughter verbalized her appreciation for CSW support and concern.

## 2013-01-21 NOTE — Progress Notes (Signed)
Subjective: Patient reports (nonverbal)  Objective: Vital signs in last 24 hours: Temp:  [97.6 F (36.4 C)-99.6 F (37.6 C)] 99.1 F (37.3 C) (12/08 0834) Pulse Rate:  [77-129] 98 (12/08 0700) Resp:  [12-34] 34 (12/08 0700) BP: (121-163)/(52-101) 145/61 mmHg (12/08 0700) SpO2:  [92 %-100 %] 96 % (12/08 0700) FiO2 (%):  [30 %] 30 % (12/07 1200) Weight:  [58.8 kg (129 lb 10.1 oz)] 58.8 kg (129 lb 10.1 oz) (12/08 0400)  Intake/Output from previous day: 12/07 0701 - 12/08 0700 In: 367 [I.V.:97; NG/GT:60; IV Piggyback:210] Out: 1210 [Urine:1210] Intake/Output this shift:    Resting quietly. Now off vent. Moves in response to touch, opens eyes to loud voice. Incision with staples, well approximated, without erythema, drainage, or swelling.  Lab Results:  Recent Labs  01/19/13 0540  WBC 8.6  HGB 9.7*  HCT 30.4*  PLT 165   BMET  Recent Labs  01/19/13 0540  NA 149*  K 4.2  CL 113*  CO2 28  GLUCOSE 172*  BUN 64*  CREATININE 1.19*  CALCIUM 8.9    Studies/Results: No results found.  Assessment/Plan:   LOS: 6 days  Will discuss with Dr. Venetia Maxon possibility of transfer to floor/palliative care.   Georgiann Cocker 01/21/2013, 8:50 AM

## 2013-01-21 NOTE — Progress Notes (Signed)
NUTRITION FOLLOW UP  Intervention:   None at this time.   Nutrition Dx:   Inadequate oral intake related to inability to eat as evidenced by NPO status; ongoing.   Goal:  Pt to meet >/= 90% of their estimated nutrition needs, not met.    Monitor:  Plan of care, weight trend, labs   Assessment:   Pt admitted after a fall at her SNF. Pt with a large SDH s/p craniotomy. Pt extubated 12/7 with no plans to re-intubate. Palliative care consult pending.   Height: Ht Readings from Last 1 Encounters:  01/16/13 5\' 3"  (1.6 m)    Weight Status:   Wt Readings from Last 1 Encounters:  01/21/13 129 lb 10.1 oz (58.8 kg)  Admission weight 130 lb (59.4 kg)  Re-estimated needs:  Kcal: 1400-1600 Protein: 65-75 grams Fluid: > 1.5 L/day  Skin: left head incision   Diet Order: NPO   Intake/Output Summary (Last 24 hours) at 01/21/13 0948 Last data filed at 01/21/13 0900  Gross per 24 hour  Intake    322 ml  Output   1130 ml  Net   -808 ml    Last BM: 12/6   Labs:   Recent Labs Lab 01/16/13 0430 01/17/13 0408 01/17/13 0935 01/18/13 0345 01/19/13 0540  NA 147* 144  --  147* 149*  K 3.1* 3.1*  --  4.2 4.2  CL 114* 106  --  110 113*  CO2 22 27  --  27 28  BUN 43* 53*  --  63* 64*  CREATININE 1.21* 1.46*  --  1.42* 1.19*  CALCIUM 7.3* 8.8  --  8.9 8.9  MG 1.7  --  1.8 2.0 2.2  PHOS 3.0  --   --  2.0* 3.3  GLUCOSE 104* 162*  --  167* 172*    CBG (last 3)   Recent Labs  01/19/13 1521 01/19/13 2008 01/19/13 2355  GLUCAP 132* 138* 162*   No results found for this basename: HGBA1C   Scheduled Meds: . antiseptic oral rinse  15 mL Mouth Rinse QID  . chlorhexidine  15 mL Mouth Rinse BID  . levETIRAcetam  500 mg Intravenous Q12H    Continuous Infusions: . sodium chloride 10 mL/hr at 01/21/13 0900    Kendell Bane RD, LDN, CNSC [phone] Pager [phone] After Hours Pager

## 2013-01-21 NOTE — Progress Notes (Signed)
PULMONARY  / CRITICAL CARE MEDICINE  Name: Sara Carlson MRN: [id number] DOB: [birth year]    ADMISSION DATE:  01-25-13 CONSULTATION DATE:  01/25/13  REFERRING MD :  Venetia Maxon PRIMARY SERVICE: Neurosurgery  CHIEF COMPLAINT:  Left subdural hematoma with coagulopathy.  BRIEF PATIENT DESCRIPTION: 79 year old female with history of CHF, A-fib, Hypertension and dementia admitted 12/2 with AMS and coagulopathy after a fall at nursing home on 12/1(CT scan 12/1 in ED negative for intracranial findings).  CT scan showed large left subdural hematoma with 10.3 cm left to right midline shift. CCM consulted for vent management post craniotomy/evacuation of hematoma.  SIGNIFICANT EVENTS / STUDIES:  12/1: CT Head: No acute intracranial findings 12/2: Admit post fall with CT Head:Large left subdural hematoma measuring 2.6 cm 12/2: Craniotomy: Evacuation of Left. Subdural Hematoma 12/7 extubated  LINES / TUBES:  ETT: 12/2>> 12/07  CULTURES: None  ANTIBIOTICS: None  SUBJECTIVE:  withdraws  VITAL SIGNS: Temp:  [97.6 F (36.4 C)-99.6 F (37.6 C)] 99.1 F (37.3 C) (12/08 0834) Pulse Rate:  [92-129] 99 (12/08 0900) Resp:  [14-34] 33 (12/08 0900) BP: (121-163)/(52-101) 151/84 mmHg (12/08 0900) SpO2:  [92 %-100 %] 100 % (12/08 0900) FiO2 (%):  [30 %] 30 % (12/07 1200) Weight:  [58.8 kg (129 lb 10.1 oz)] 58.8 kg (129 lb 10.1 oz) (12/08 0400)  HEMODYNAMICS:    VENTILATOR SETTINGS: Vent Mode:  [-] PSV;CPAP FiO2 (%):  [30 %] 30 % PEEP:  [5 cmH20] 5 cmH20 Pressure Support:  [10 cmH20] 10 cmH20  INTAKE / OUTPUT: Intake/Output     12/07 0701 - 12/08 0700 12/08 0701 - 12/09 0700   I.V. (mL/kg) 97 (1.6) 20 (0.3)   Other     NG/GT 60    IV Piggyback 210    Total Intake(mL/kg) 367 (6.2) 20 (0.3)   Urine (mL/kg/hr) 1210 (0.9)    Total Output 1210     Net -843 +20         PHYSICAL EXAMINATION: General:  Minimally responsive, severe resp distress Neuro: Unresponsive but withdraws to pain, no  changes Cardiovascular: Irregular rate and rhythm, no M/R/G. Lungs: ronchi diffuse moderate Abdomen: Soft, flat, non-tender, diminished BS. Musculoskeletal: No edema  LABS:  I have reviewed all of today's lab results. Relevant abnormalities are discussed in the A/P section  CXR: NNF  ASSESSMENT / PLAN:  PULMONARY A:  Acute Respiratory Failure 2/2 Subdural hematoma requiring craniotomy. Sever distress Poor airway protection P: She is in distress and requires full comfort care I WILL CALL DAUGHTER now - done , approved Dc pall care consult Start morphine drip and allow her to pass in peace Titrate morphine to rr 12-18 to distress / pain   CARDIOVASCULAR A: Atrial Fibrillation rate controlled Hypertension  CHF Hyperlipidemia P:  Needs comfort  RENAL A:  Acute Renal Failure P:   Monitor BMET reviewed Can tolerate some hypernatremia with concern brain edema Avoid free water  GASTROINTESTINAL A: Protein-calorie malnutrition  P:   - PPI for sup. - would NOT feed  ENDOCRINE A:  At risk for hypoglycemia  P:  - CBG Q 4, dc  NEUROLOGIC A:  Large Left Subdural Hematoma S/P Craniotomy AMS Poor prognosis P:   - requires full comfort care, see pulm section  Mcarthur Rossetti. Tyson Alias, MD, FACP Pgr: [phone] [facility]

## 2013-01-21 NOTE — Progress Notes (Signed)
Pt to be transferred to 6N24.  Report given to Harriett Sine, Charity fundraiser.

## 2013-01-21 NOTE — Progress Notes (Signed)
Patient ID: Sara Carlson, female   DOB: 01-19-1923, 79 y.o.   MRN: [id number]  Per Dr. Venetia Maxon, ok for Palliative Care consult. C/S entered into Epic. Stay in Neuro ICU until consult & recommendation rec'd. Message left on PC voicemail [phone]).  Georgiann Cocker, RN, BSN

## 2013-01-24 NOTE — Discharge Summary (Signed)
Physician Discharge Summary  Patient ID: Sara Carlson MRN: [id number] DOB/AGE: January 06, 1923 79 y.o.  Admit date: 02-03-2013 Discharge date: 01/24/2013  Admission Diagnoses: Left Subdural hematoma with coagulopathy   Discharge Diagnoses: Deceased   Principal Problem:   Subdural hematoma Active Problems:   Acute respiratory failure   Altered mental status   HTN (hypertension)   Fracture of left distal radius   Discharged Condition: Deceased  Hospital Course: Sara Carlson was brought to the Emergency Department by EMS with altered mental status on 02-03-13. ED eval after fall 01-14-13:Negative CT, supratherapeutic INR, instructions to stop coumadin. AM of February 03, 2013, pt was difficult to arouse. Repeat CT revealed large left SDH. Discussion with family revealed their desire to proceed with surgery. She was taken to Neuro OR for emergent craniotomy for evacuation of left SDH. Following uncomplicated surgery, she was transferred to Neuro ICU for recovery and observation. Critical Care Medicine was consulted and assisted with medical management. She was slow to wean, experiencing apneic episodes in trials, but agitation without Fentanyl. As she failed to demonstrate improvement, her family agreed to cease aggressive efforts and focus on comfort measures. She was transferred to Palliative Care and allowed to pass peacefully 01-24-13  Consults: pulmonary/intensive care  Significant Diagnostic Studies:   Treatments: surgery: CRANIOTOMY HEMATOMA EVACUATION SUBDURAL (Left)   Discharge Exam: Deceased   Disposition: 20-Expired     Medication List    ASK your doctor about these medications       acetaminophen 500 MG tablet  Commonly known as:  TYLENOL  Take 1,000 mg by mouth every 6 (six) hours as needed for pain.     carvedilol 6.25 MG tablet  Commonly known as:  COREG  take 1 tablet by mouth twice a day     divalproex 125 MG capsule  Commonly known as:  DEPAKOTE SPRINKLE  Take  125-250 mg by mouth 3 (three) times daily. Take 125mg  (1 capsule) at 8am and take 250mg  (2 capsule) at 12 noon and 4pm     doxazosin 8 MG tablet  Commonly known as:  CARDURA  take 1 tablet by mouth at bedtime     febuxostat 40 MG tablet  Commonly known as:  ULORIC  take 1 tablet by mouth once daily     furosemide 40 MG tablet  Commonly known as:  LASIX  Take 1 tablet (40 mg total) by mouth daily.     LORazepam 0.5 MG tablet  Commonly known as:  ATIVAN  Take 0.5 mg by mouth every 6 (six) hours as needed for anxiety.     metolazone 2.5 MG tablet  Commonly known as:  ZAROXOLYN  take 1 tablet by mouth once daily     potassium chloride SA 20 MEQ tablet  Commonly known as:  K-DUR,KLOR-CON  Take 1 tablet (20 mEq total) by mouth daily.     QUEtiapine 50 MG tablet  Commonly known as:  SEROQUEL  Take 50 mg by mouth at bedtime.     sertraline 50 MG tablet  Commonly known as:  ZOLOFT  Take 50 mg by mouth daily.     spironolactone 25 MG tablet  Commonly known as:  ALDACTONE  Take 12.5 mg by mouth every morning.     warfarin 2.5 MG tablet  Commonly known as:  COUMADIN  Take 2.5-5 mg by mouth daily. Take 1 tablet (2.5mg ) on Tuesday.  Take 2 tablets (5mg ) on monday, Wednesday, thursday, Friday, Saturday, sunday  Follow-up Information   Follow up with Eulas Post, MD. Schedule an appointment as soon as possible for a visit in 1 week.   Specialty:  Orthopedic Surgery   Contact information:   [address] [place] [address] [phone]       Signed: Dorian Heckle 01/24/2013, 10:31 AM

## 2013-02-14 NOTE — Progress Notes (Signed)
Pt found unresponsive with no carotid or brachial pulse upon palpation ,no apical pulse heard upon auscultation. Pt without inspiratory and exspiratory breathing , no noted breath sounds. MD made aware. Ok for nurses to pronounce per MD. Daughter @ bedside. Chaplain called.

## 2013-02-14 NOTE — Progress Notes (Signed)
Patient DNR. Next of kin at bedside. Verified no signs of life with Eloy End RN

## 2013-02-14 NOTE — Progress Notes (Signed)
Subjective: Patient reports (nonverbal)  Objective: Vital signs in last 24 hours: Temp:  [97.7 F (36.5 C)-99.1 F (37.3 C)] 98.3 F (36.8 C) (12/09 0626) Pulse Rate:  [90-121] 90 (12/09 0626) Resp:  [8-34] 10 (12/09 0626) BP: (86-170)/(39-104) 86/39 mmHg (12/09 0626) SpO2:  [94 %-100 %] 94 % (12/09 0626) Weight:  [60.8 kg (134 lb 0.6 oz)] 60.8 kg (134 lb 0.6 oz) (12/08 2128)  Intake/Output from previous day: 12/08 0701 - 12/09 0700 In: 335.9 [I.V.:230.9; IV Piggyback:105] Out: 380 [Urine:380] Intake/Output this shift:    Resting quietly. Some increased respiratory effort this am, congestion audible.  Nurse present, noting daughter en route.   Lab Results: No results found for this basename: WBC, HGB, HCT, PLT,  in the last 72 hours BMET No results found for this basename: NA, K, CL, CO2, GLUCOSE, BUN, CREATININE, CALCIUM,  in the last 72 hours  Studies/Results: No results found.  Assessment/Plan:   LOS: 7 days  Continue comfort care.   Georgiann Cocker 01/24/13, 8:16 AM

## 2013-02-14 NOTE — Progress Notes (Signed)
Chaplain provided ministry of presence, emotional and grief support to patient's daughter. Patient's daughter was at bedside and very tearful. Chaplain provided spiritual support through prayer.   2013/02/12 1000  Clinical Encounter Type  Visited With Family  Visit Type Initial;Spiritual support;Social support  Referral From Nurse  Spiritual Encounters  Spiritual Needs Prayer;Emotional;Grief support  Stress Factors  Family Stress Factors Health changes;Loss;Major life changes

## 2013-02-14 NOTE — Clinical Social Work Note (Signed)
6N CSW received handoff from 40M CSW stating that pt was from [facility] memory care unit. 6N CSW is aware of pt's disposition as expected hospital death.   Darlyn Chamber, LCSWA Clinical Social Worker [phone]

## 2013-02-14 NOTE — Progress Notes (Signed)
PULMONARY  / CRITICAL CARE MEDICINE  Name: Sara Carlson MRN: [id number] DOB: 06/13/1922    ADMISSION DATE:  02/02/2013 CONSULTATION DATE:  01/25/2013  REFERRING MD :  Venetia Maxon PRIMARY SERVICE: Neurosurgery  CHIEF COMPLAINT:  Left subdural hematoma with coagulopathy.  BRIEF PATIENT DESCRIPTION: 80 year old female with history of CHF, A-fib, Hypertension and dementia admitted 12/2 with AMS and coagulopathy after a fall at nursing home on 12/1(CT scan 12/1 in ED negative for intracranial findings).  CT scan showed large left subdural hematoma with 10.3 cm left to right midline shift. CCM consulted for vent management post craniotomy/evacuation of hematoma.  SIGNIFICANT EVENTS / STUDIES:  12/1: CT Head: No acute intracranial findings 12/2: Admit post fall with CT Head:Large left subdural hematoma measuring 2.6 cm 12/2: Craniotomy: Evacuation of Left. Subdural Hematoma 12/7 extubated 12/9 full comfort care mode. LINES / TUBES:  ETT: 12/2>> 12/07  CULTURES: None  ANTIBIOTICS: None  SUBJECTIVE:   Comfortable  VITAL SIGNS: Temp:  [97.7 F (36.5 C)-98.3 F (36.8 C)] 98.3 F (36.8 C) (12/09 0626) Pulse Rate:  [90-121] 90 (12/09 0626) Resp:  [8-21] 10 (12/09 0626) BP: (86-120)/(39-55) 86/39 mmHg (12/09 0626) SpO2:  [94 %-100 %] 94 % (12/09 0626) Weight:  [134 lb 0.6 oz (60.8 kg)] 134 lb 0.6 oz (60.8 kg) (12/08 2128)  HEMODYNAMICS:    VENTILATOR SETTINGS:    INTAKE / OUTPUT: Intake/Output     12/08 0701 - 12/09 0700 12/09 0701 - 12/10 0700   I.V. (mL/kg) 230.9 (3.8)    NG/GT     IV Piggyback 105    Total Intake(mL/kg) 335.9 (5.5)    Urine (mL/kg/hr) 380 (0.3)    Total Output 380     Net -44.1           PHYSICAL EXAMINATION: General:  Minimally responsive, comfortable on MSO4 drip Neuro: Unresponsive  Cardiovascular: Irregular rate and rhythm, no M/R/G. Lungs: ronchi diffuse moderate Abdomen: Soft, flat, non-tender, diminished BS. Musculoskeletal: No  edema  LABS:  none  CXR: NNF  ASSESSMENT / PLAN:  PULMONARY A:  Acute Respiratory Failure 2/2 Subdural hematoma requiring craniotomy. Sever distress Poor airway protection P  Full comfort care  Morphine drip and allow her to pass in peace Titrate morphine to rr 12-18 to distress / pain    Brett Canales Minor ACNP Adolph Pollack PCCM Pager [phone] till 3 pm If no answer page [phone]  Independently examined pt, evaluated data & formulated above care plan with NP who scribed this note & edited by me.  Delesha Pohlman V.  2013-02-18, 12:06 PM

## 2013-02-14 NOTE — Progress Notes (Signed)
Responded to unit Nurse page to provided additional support to family at bedside after the passing of patient.  Explored with family their emotional and spiritual needs.  Family tearfully and verbally grieving while processed their emotions and reaffirming their faith in God. Provided Prayer, grief support and resource referral.

## 2013-02-14 DEATH — deceased

## 2014-04-02 IMAGING — CR DG CHEST 1V PORT
1 series · 1 of 1 positions shown · non-contrast
Comparison: 01/17/2013.

CLINICAL DATA: Endotracheal tube position

EXAM:
PORTABLE CHEST - 1 VIEW

[AP]
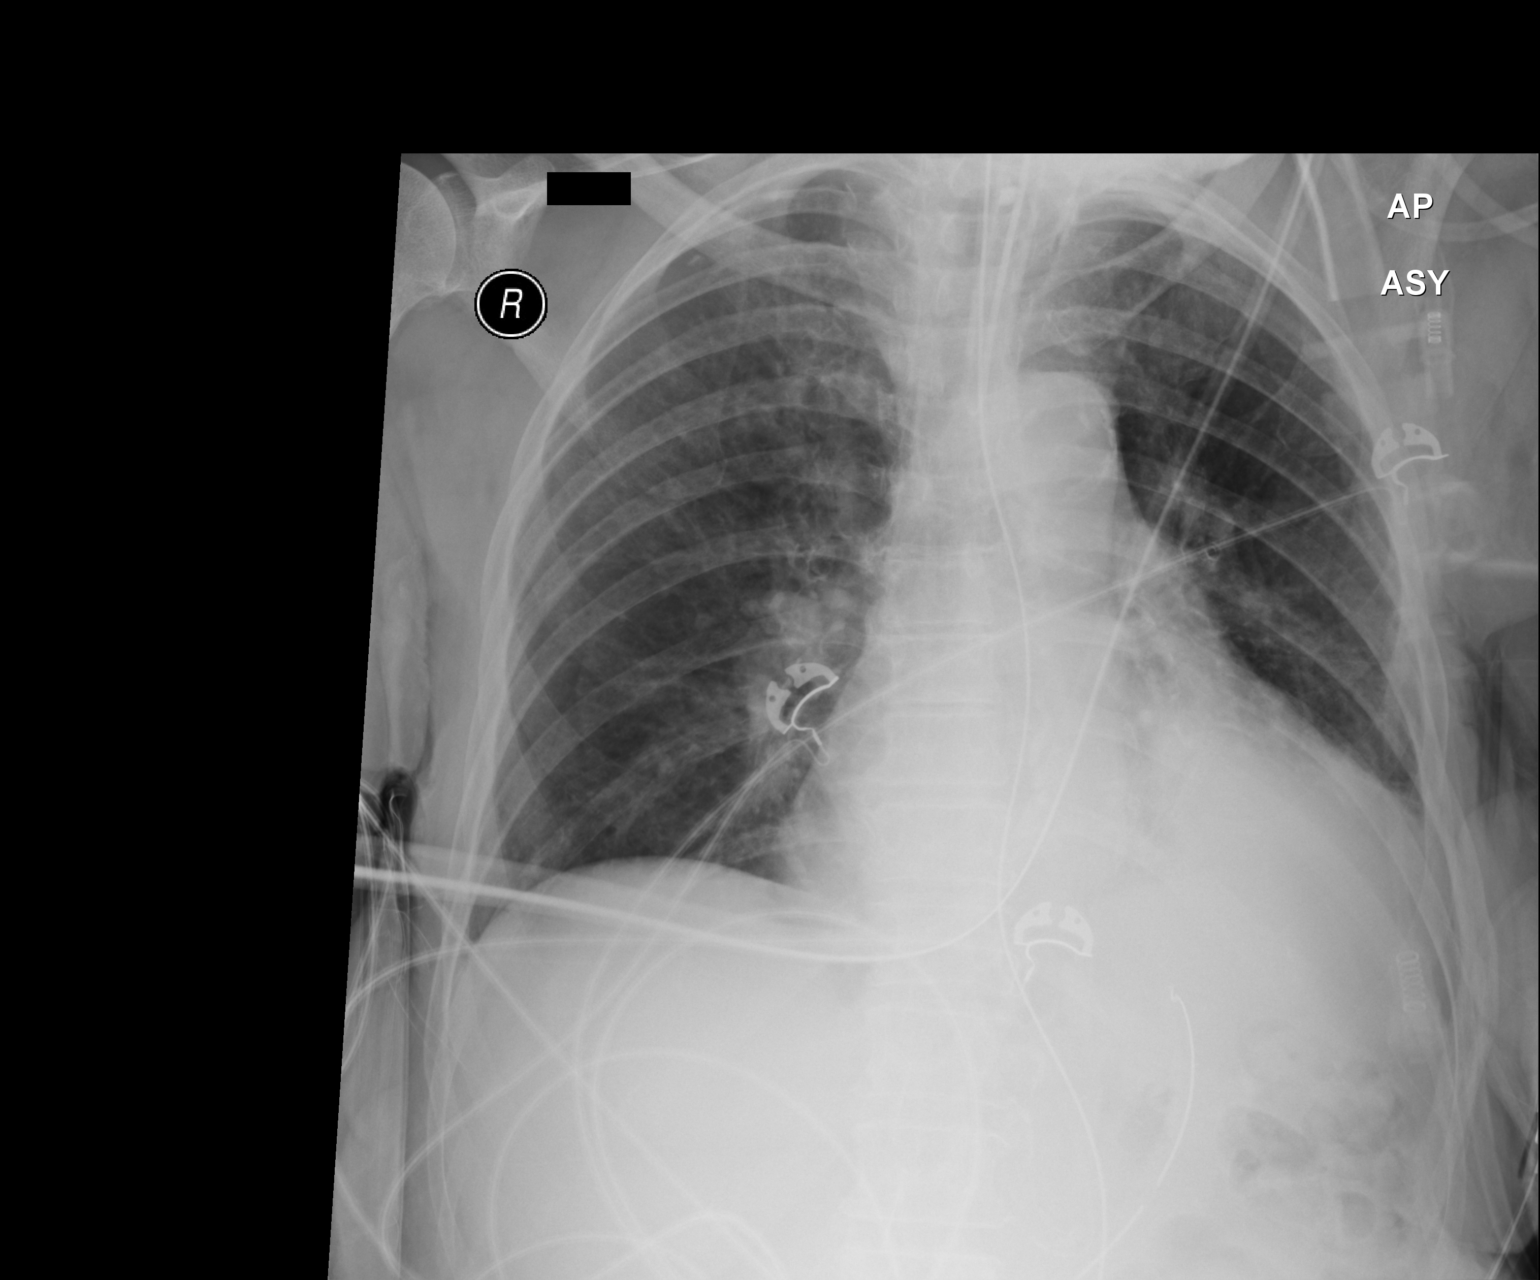

[1 of 1 positions shown; findings below may reference images not displayed]

FINDINGS: Endotracheal tube in good position.  NG tube in the stomach.

Progressive atelectasis in the left lower lobe. Right lung remains
clear. Negative for edema.
IMPRESSION: Progressive left lower lobe atelectasis. Endotracheal tube remains
in good position.
